# Patient Record
Sex: Male | Born: 1950 | Race: White | Hispanic: No | Marital: Married | Smoking: Former smoker
Health system: Southern US, Community
[De-identification: ages and names within clinical notes are randomized; demographics above are authoritative.]

## PROBLEM LIST (undated history)

## (undated) DIAGNOSIS — E119 Type 2 diabetes mellitus without complications: Secondary | ICD-10-CM

## (undated) DIAGNOSIS — T7840XA Allergy, unspecified, initial encounter: Secondary | ICD-10-CM

## (undated) DIAGNOSIS — E785 Hyperlipidemia, unspecified: Secondary | ICD-10-CM

## (undated) DIAGNOSIS — R739 Hyperglycemia, unspecified: Secondary | ICD-10-CM

## (undated) DIAGNOSIS — I10 Essential (primary) hypertension: Secondary | ICD-10-CM

## (undated) DIAGNOSIS — F319 Bipolar disorder, unspecified: Secondary | ICD-10-CM

## (undated) DIAGNOSIS — K635 Polyp of colon: Secondary | ICD-10-CM

## (undated) DIAGNOSIS — T781XXA Other adverse food reactions, not elsewhere classified, initial encounter: Secondary | ICD-10-CM

## (undated) HISTORY — DX: Type 2 diabetes mellitus without complications: E11.9

## (undated) HISTORY — DX: Allergy, unspecified, initial encounter: T78.40XA

## (undated) HISTORY — DX: Essential (primary) hypertension: I10

## (undated) HISTORY — DX: Bipolar disorder, unspecified: F31.9

## (undated) HISTORY — DX: Hyperlipidemia, unspecified: E78.5

## (undated) HISTORY — DX: Hyperglycemia, unspecified: R73.9

## (undated) HISTORY — DX: Other adverse food reactions, not elsewhere classified, initial encounter: T78.1XXA

## (undated) HISTORY — DX: Polyp of colon: K63.5

## (undated) HISTORY — PX: APPENDECTOMY: SHX54

## (undated) HISTORY — PX: EYE SURGERY: SHX253

## (undated) HISTORY — PX: SMALL INTESTINE SURGERY: SHX150

---

## 1994-06-13 DIAGNOSIS — F319 Bipolar disorder, unspecified: Secondary | ICD-10-CM

## 1994-06-13 HISTORY — DX: Bipolar disorder, unspecified: F31.9

## 2000-07-11 ENCOUNTER — Emergency Department (HOSPITAL_COMMUNITY): Admission: EM | Admit: 2000-07-11 | Discharge: 2000-07-11 | Payer: Self-pay | Admitting: Emergency Medicine

## 2002-03-18 ENCOUNTER — Encounter: Payer: Self-pay | Admitting: Emergency Medicine

## 2002-03-18 ENCOUNTER — Emergency Department (HOSPITAL_COMMUNITY): Admission: EM | Admit: 2002-03-18 | Discharge: 2002-03-18 | Payer: Self-pay | Admitting: Emergency Medicine

## 2008-06-13 LAB — HM COLONOSCOPY

## 2010-12-01 ENCOUNTER — Encounter: Payer: Self-pay | Admitting: Family Medicine

## 2010-12-01 DIAGNOSIS — F319 Bipolar disorder, unspecified: Secondary | ICD-10-CM | POA: Insufficient documentation

## 2010-12-01 DIAGNOSIS — Z889 Allergy status to unspecified drugs, medicaments and biological substances status: Secondary | ICD-10-CM

## 2010-12-01 DIAGNOSIS — I10 Essential (primary) hypertension: Secondary | ICD-10-CM

## 2010-12-01 DIAGNOSIS — K635 Polyp of colon: Secondary | ICD-10-CM

## 2010-12-21 ENCOUNTER — Other Ambulatory Visit: Payer: Self-pay | Admitting: Family Medicine

## 2010-12-24 ENCOUNTER — Other Ambulatory Visit: Payer: Self-pay

## 2010-12-31 ENCOUNTER — Ambulatory Visit
Admission: RE | Admit: 2010-12-31 | Discharge: 2010-12-31 | Disposition: A | Discharge status: Home / Self Care | Payer: BC Managed Care – PPO | Source: Ambulatory Visit | Attending: Family Medicine | Admitting: Family Medicine

## 2013-03-26 ENCOUNTER — Other Ambulatory Visit: Payer: BC Managed Care – PPO

## 2013-03-26 DIAGNOSIS — Z79899 Other long term (current) drug therapy: Secondary | ICD-10-CM

## 2013-03-26 DIAGNOSIS — E785 Hyperlipidemia, unspecified: Secondary | ICD-10-CM

## 2013-03-26 DIAGNOSIS — I10 Essential (primary) hypertension: Secondary | ICD-10-CM

## 2013-03-26 LAB — COMPLETE METABOLIC PANEL WITH GFR
AST: 42 U/L — ABNORMAL HIGH (ref 0–37)
Albumin: 4.1 g/dL (ref 3.5–5.2)
Alkaline Phosphatase: 69 U/L (ref 39–117)
GFR, Est African American: 82 mL/min
GFR, Est Non African American: 71 mL/min
Glucose, Bld: 110 mg/dL — ABNORMAL HIGH (ref 70–99)
Potassium: 4.2 mEq/L (ref 3.5–5.3)
Sodium: 137 mEq/L (ref 135–145)
Total Bilirubin: 0.8 mg/dL (ref 0.3–1.2)
Total Protein: 7 g/dL (ref 6.0–8.3)

## 2013-03-26 LAB — CBC WITH DIFFERENTIAL/PLATELET
Basophils Absolute: 0 10*3/uL (ref 0.0–0.1)
Basophils Relative: 1 % (ref 0–1)
Eosinophils Absolute: 0.3 10*3/uL (ref 0.0–0.7)
Eosinophils Relative: 4 % (ref 0–5)
HCT: 47.2 % (ref 39.0–52.0)
Hemoglobin: 16.4 g/dL (ref 13.0–17.0)
Lymphocytes Relative: 37 % (ref 12–46)
Lymphs Abs: 2.8 10*3/uL (ref 0.7–4.0)
MCH: 31.1 pg (ref 26.0–34.0)
MCHC: 34.7 g/dL (ref 30.0–36.0)
MCV: 89.4 fL (ref 78.0–100.0)
Monocytes Absolute: 0.5 10*3/uL (ref 0.1–1.0)
Monocytes Relative: 7 % (ref 3–12)
Neutro Abs: 3.8 10*3/uL (ref 1.7–7.7)
Neutrophils Relative %: 51 % (ref 43–77)
Platelets: 167 10*3/uL (ref 150–400)
RBC: 5.28 MIL/uL (ref 4.22–5.81)
RDW: 13.5 % (ref 11.5–15.5)
WBC: 7.5 10*3/uL (ref 4.0–10.5)

## 2013-03-26 LAB — LIPID PANEL
Cholesterol: 197 mg/dL (ref 0–200)
HDL: 48 mg/dL (ref >39)
Triglycerides: 197 mg/dL — ABNORMAL HIGH (ref <150)
VLDL: 39 mg/dL (ref 0–40)

## 2013-03-26 LAB — TSH: TSH: 2.672 u[IU]/mL (ref 0.350–4.500)

## 2013-03-26 LAB — PSA: PSA: 0.24 ng/mL (ref <=4.00)

## 2013-03-26 LAB — HEMOGLOBIN A1C: Mean Plasma Glucose: 137 mg/dL — ABNORMAL HIGH (ref <117)

## 2013-03-27 LAB — VITAMIN D 25 HYDROXY (VIT D DEFICIENCY, FRACTURES): Vit D, 25-Hydroxy: 33 ng/mL (ref 30–89)

## 2013-04-03 ENCOUNTER — Ambulatory Visit (INDEPENDENT_AMBULATORY_CARE_PROVIDER_SITE_OTHER): Payer: BC Managed Care – PPO | Admitting: Physician Assistant

## 2013-04-03 ENCOUNTER — Encounter: Payer: Self-pay | Admitting: Physician Assistant

## 2013-04-03 VITALS — BP 200/124 | HR 68 | Temp 98.7°F | Resp 18 | Ht 69.25 in | Wt 233.0 lb

## 2013-04-03 DIAGNOSIS — E785 Hyperlipidemia, unspecified: Secondary | ICD-10-CM | POA: Insufficient documentation

## 2013-04-03 DIAGNOSIS — D126 Benign neoplasm of colon, unspecified: Secondary | ICD-10-CM

## 2013-04-03 DIAGNOSIS — Z9109 Other allergy status, other than to drugs and biological substances: Secondary | ICD-10-CM

## 2013-04-03 DIAGNOSIS — Z889 Allergy status to unspecified drugs, medicaments and biological substances status: Secondary | ICD-10-CM

## 2013-04-03 DIAGNOSIS — R739 Hyperglycemia, unspecified: Secondary | ICD-10-CM | POA: Insufficient documentation

## 2013-04-03 DIAGNOSIS — F319 Bipolar disorder, unspecified: Secondary | ICD-10-CM

## 2013-04-03 DIAGNOSIS — K635 Polyp of colon: Secondary | ICD-10-CM

## 2013-04-03 DIAGNOSIS — I10 Essential (primary) hypertension: Secondary | ICD-10-CM

## 2013-04-03 DIAGNOSIS — Z23 Encounter for immunization: Secondary | ICD-10-CM

## 2013-04-03 MED ORDER — LISINOPRIL 20 MG PO TABS: 20.0000 mg | ORAL_TABLET | Freq: Every day | ORAL | Status: DC

## 2013-04-03 MED ORDER — HYDROCHLOROTHIAZIDE 12.5 MG PO CAPS: 12.5000 mg | ORAL_CAPSULE | Freq: Every day | ORAL | Status: DC

## 2013-04-03 MED ORDER — FLUTICASONE PROPIONATE 50 MCG/ACT NA SUSP: 2.0000 | Freq: Every day | NASAL | Status: DC

## 2013-04-03 NOTE — Progress Notes (Signed)
Patient ID: Daniel Blackwell MRN: [id number], DOB: 1951-03-28, 62 y.o. Date of Encounter: @DATE @  Chief Complaint:  Chief Complaint  Patient presents with  . problems with BP    has taken no meds sicne July    HPI: 62 y.o. year old white male  presents for followup office visit. m His last office visit here was actually June of 2013. He did come in fasting for lab work within the past week.  He reports that over the summer time he was not having to work for his job nearly as many hours.  Therefore he time to be a lot more active etc. When he checked his blood pressure he was getting low readings. He says that he is seeing the same pattern over the summers  every year.  He was having no lightheadedness on his blood pressure medicines but because he was getting lower blood pressure readings he stopped the medications. However now recently he has been getting higher readings so he came back in to restart his blood pressure medicines.  He reports that he is taking Allegra daily but still has quite a bit of congestion in his nasal area.  He has no other complaints.    Past Medical History  Diagnosis Date  . Allergy     mold/yeast  . Bipolar 1 disorder   . Colon polyps   . Hypertension      Home Meds: See attached medication section for current medication list. Any medications entered into computer today will not appear on this note's list. The medications listed below were entered prior to today. No current outpatient prescriptions on file prior to visit.   No current facility-administered medications on file prior to visit.    Allergies:  Allergies  Allergen Reactions  . Asa [Aspirin] Swelling  . Other Swelling    CASHEWS  . Penicillins   . Sulfa Antibiotics     History   Social History  . Marital Status: Married    Spouse Name: N/A    Number of Children: N/A  . Years of Education: N/A   Occupational History  . 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 Smoking status: Former Smoker    Quit date: 04/04/1983  . Smokeless tobacco: Never Used  . Alcohol Use: 0.5 oz/week    1 drink(s) per week  . Drug Use: No  . Sexual Activity: Not on file   Other Topics Concern  . 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   At certain times of the year he works long hours   but then at certain times of the year works a few hours.    No family history on file.   Review of Systems:  See HPI for pertinent ROS. All other ROS negative.    Physical Exam: Blood pressure 200/124, pulse 68, temperature 98.7 F (37.1 C), temperature source Oral, resp. rate 18, height 5' 9.25" (1.759 m), weight 233 lb (105.688 kg)., Body mass index is 34.16 kg/(m^2). General: WNWD WM. Appears in no acute distress. Neck: Supple. No thyromegaly. No lymphadenopathy. No carotid bruits. Lungs: Clear bilaterally to auscultation without wheezes, rales, or rhonchi. Breathing is unlabored. Heart: RRR with S1 S2. No murmurs, rubs, or gallops. Abdomen: Soft, non-tender, non-distended with normoactive bowel sounds. No  hepatomegaly. No rebound/guarding. No obvious abdominal masses. Musculoskeletal:  Strength and tone normal for age. Extremities/Skin: Warm and dry. No clubbing or cyanosis. No edema. No rashes or suspicious lesions. Neuro: Alert and oriented X 3. Moves all extremities spontaneously. Gait is normal. CNII-XII grossly in tact. Psych:  Responds to questions appropriately with a normal affect.   Results for orders placed in visit on 03/26/13  CBC WITH DIFFERENTIAL      Result Value Range   WBC 7.5  4.0 - 10.5 K/uL   RBC 5.28  4.22 - 5.81 MIL/uL   Hemoglobin 16.4  13.0 - 17.0 g/dL   HCT 16.1  09.6 - 04.5 %   MCV 89.4  78.0 - 100.0 fL   MCH 31.1  26.0 - 34.0 pg   MCHC 34.7  30.0 - 36.0 g/dL   RDW 40.9  81.1 - 91.4 %    Platelets 167  150 - 400 K/uL   Neutrophils Relative % 51  43 - 77 %   Neutro Abs 3.8  1.7 - 7.7 K/uL   Lymphocytes Relative 37  12 - 46 %   Lymphs Abs 2.8  0.7 - 4.0 K/uL   Monocytes Relative 7  3 - 12 %   Monocytes Absolute 0.5  0.1 - 1.0 K/uL   Eosinophils Relative 4  0 - 5 %   Eosinophils Absolute 0.3  0.0 - 0.7 K/uL   Basophils Relative 1  0 - 1 %   Basophils Absolute 0.0  0.0 - 0.1 K/uL   Smear Review Criteria for review not met    LIPID PANEL      Result Value Range   Cholesterol 197  0 - 200 mg/dL   Triglycerides 782 (*) <150 mg/dL   HDL 48  >95 mg/dL   Total CHOL/HDL Ratio 4.1     VLDL 39  0 - 40 mg/dL   LDL Cholesterol 621 (*) 0 - 99 mg/dL  HEMOGLOBIN H0Q      Result Value Range   Hemoglobin A1C 6.4 (*) <5.7 %   Mean Plasma Glucose 137 (*) <117 mg/dL  PSA      Result Value Range   PSA 0.24  <=4.00 ng/mL  TSH      Result Value Range   TSH 2.672  0.350 - 4.500 uIU/mL  VITAMIN D 25 HYDROXY      Result Value Range   Vit D, 25-Hydroxy 33  30 - 89 ng/mL  COMPLETE METABOLIC PANEL WITH GFR      Result Value Range   Sodium 137  135 - 145 mEq/L   Potassium 4.2  3.5 - 5.3 mEq/L   Chloride 99  96 - 112 mEq/L   CO2 27  19 - 32 mEq/L   Glucose, Bld 110 (*) 70 - 99 mg/dL   BUN 15  6 - 23 mg/dL   Creat 6.57  8.46 - 9.62 mg/dL   Total Bilirubin 0.8  0.3 - 1.2 mg/dL   Alkaline Phosphatase 69  39 - 117 U/L   AST 42 (*) 0 - 37 U/L   ALT 56 (*) 0 - 53 U/L   Total Protein 7.0  6.0 - 8.3 g/dL   Albumin 4.1  3.5 - 5.2 g/dL   Calcium 9.3  8.4 - 95.2 mg/dL   GFR, Est African American 82     GFR, Est Non African American 29       ASSESSMENT AND PLAN:  62 y.o. year old male with  1. Hypertension Restart prior blood pressure medications. Turn to clinic in 2 weeks to recheck blood pressure and BMET on these medications. - lisinopril (PRINIVIL,ZESTRIL) 20 MG tablet; Take 1 tablet (20 mg total) by mouth daily.  Dispense: 30 tablet; Refill: 0 - hydrochlorothiazide (MICROZIDE) 12.5  MG capsule; Take 1 capsule (12.5 mg total) by mouth daily.  Dispense: 30 capsule; Refill: 0  2. Multiple allergies Continue Allegra. Add Flonase as needed. - fluticasone (FLONASE) 50 MCG/ACT nasal spray; Place 2 sprays into the nose daily.  Dispense: 16 g; Refill: 6  3. Hyperglycemia/ Borderline Diabetes He reports that he does still have his carbohydrate information. Continue to follow a low carbohydrate diet and continue cardiovascular exercise.  4. Mild Hyperlipidemia: Discussed with patient that his cholesterol is not severely elevated by given his diabetes and his family history of CAD but I recommend a low dose statin. However he defers this at this time. He states that he will increase the dose of his fistula for now.  3. Colon polyps My last note in June 2013 documented that his last screening colonoscopy was performed 03/13/2006. It had revealed a polyp. We discussed he was due for followup. He discussed that his prior colonoscopy was performed by Dr. that is no longer covered by his insurance plan. His wife was scheduled for colonoscopy with a different GI doctor and he was going to decide whether to schedule followup colonoscopy but that same GI doctor or not. Today he states that he saw in his checkbook records that indicated he had a colonoscopy since the 1 in 2007. However he knows no further details about it than this. I told him to try to check his records further we can follow this up.  4. prostate cancer screening: PSA was normal at lap above  5. Immunizations: A. tetanus was updated here June 2013. B. influenza vaccine: He is agreeable to get this today. We'll give now. C. Zostavax: He states he did check with his insurance but they do not cover this at all. Therefore he defers this. D. Pneumovax: We'll do this at age 83.     [address] [place], [place], [facility] 04/03/2013 9:23 AM

## 2013-04-17 ENCOUNTER — Encounter: Payer: BC Managed Care – PPO | Admitting: Physician Assistant

## 2013-04-17 ENCOUNTER — Encounter: Payer: Self-pay | Admitting: Physician Assistant

## 2013-04-17 NOTE — Progress Notes (Signed)
Patient left office without being seen.

## 2013-04-18 NOTE — Progress Notes (Signed)
This encounter was created in error - please disregard.

## 2013-04-21 ENCOUNTER — Encounter: Payer: Self-pay | Admitting: Physician Assistant

## 2014-05-19 ENCOUNTER — Encounter: Payer: Self-pay | Admitting: Physician Assistant

## 2014-05-19 ENCOUNTER — Ambulatory Visit (INDEPENDENT_AMBULATORY_CARE_PROVIDER_SITE_OTHER): Payer: Medicare PPO | Admitting: Physician Assistant

## 2014-05-19 VITALS — BP 160/106 | HR 64 | Temp 98.9°F | Resp 20 | Wt 228.0 lb

## 2014-05-19 DIAGNOSIS — E785 Hyperlipidemia, unspecified: Secondary | ICD-10-CM

## 2014-05-19 DIAGNOSIS — F319 Bipolar disorder, unspecified: Secondary | ICD-10-CM

## 2014-05-19 DIAGNOSIS — K635 Polyp of colon: Secondary | ICD-10-CM

## 2014-05-19 DIAGNOSIS — Z125 Encounter for screening for malignant neoplasm of prostate: Secondary | ICD-10-CM

## 2014-05-19 DIAGNOSIS — R739 Hyperglycemia, unspecified: Secondary | ICD-10-CM

## 2014-05-19 DIAGNOSIS — I10 Essential (primary) hypertension: Secondary | ICD-10-CM

## 2014-05-19 DIAGNOSIS — J988 Other specified respiratory disorders: Secondary | ICD-10-CM

## 2014-05-19 DIAGNOSIS — B9689 Other specified bacterial agents as the cause of diseases classified elsewhere: Secondary | ICD-10-CM

## 2014-05-19 LAB — LIPID PANEL
CHOLESTEROL: 211 mg/dL — AB (ref 0–200)
HDL: 48 mg/dL (ref >39)
LDL Cholesterol: 118 mg/dL — ABNORMAL HIGH (ref 0–99)
TRIGLYCERIDES: 223 mg/dL — AB (ref <150)
Total CHOL/HDL Ratio: 4.4 Ratio
VLDL: 45 mg/dL — AB (ref 0–40)

## 2014-05-19 LAB — COMPLETE METABOLIC PANEL WITH GFR
ALT: 36 U/L (ref 0–53)
AST: 26 U/L (ref 0–37)
Albumin: 4.4 g/dL (ref 3.5–5.2)
Alkaline Phosphatase: 94 U/L (ref 39–117)
BUN: 14 mg/dL (ref 6–23)
CO2: 26 mEq/L (ref 19–32)
Calcium: 9.6 mg/dL (ref 8.4–10.5)
Chloride: 99 mEq/L (ref 96–112)
Creat: 1.06 mg/dL (ref 0.50–1.35)
GFR, Est African American: 85 mL/min
GFR, Est Non African American: 73 mL/min
Glucose, Bld: 93 mg/dL (ref 70–99)
POTASSIUM: 4 meq/L (ref 3.5–5.3)
SODIUM: 139 meq/L (ref 135–145)
Total Bilirubin: 0.6 mg/dL (ref 0.2–1.2)
Total Protein: 7.6 g/dL (ref 6.0–8.3)

## 2014-05-19 LAB — HEMOGLOBIN A1C
Hgb A1c MFr Bld: 6.4 % — ABNORMAL HIGH (ref <5.7)
MEAN PLASMA GLUCOSE: 137 mg/dL — AB (ref <117)

## 2014-05-19 MED ORDER — AZITHROMYCIN 250 MG PO TABS: ORAL_TABLET | ORAL | Status: DC

## 2014-05-19 MED ORDER — HYDROCHLOROTHIAZIDE 12.5 MG PO CAPS: 12.5000 mg | ORAL_CAPSULE | Freq: Every day | ORAL | Status: DC

## 2014-05-19 MED ORDER — LISINOPRIL 20 MG PO TABS: 20.0000 mg | ORAL_TABLET | Freq: Every day | ORAL | Status: DC

## 2014-05-19 NOTE — Progress Notes (Signed)
Patient ID: Daniel Blackwell MRN: [id number], DOB: [birth year], 63 y.o. Date of Encounter: @DATE @  Chief Complaint:  Chief Complaint  Patient presents with  . sick x 1 month    recurrent head cold, HA's, thick secretions, OTC not helping, has run out of BP meds for months now    HPI: 63 y.o. year old white male  presents with above symptoms.  Says it all seems to be head and nasal congestion. He has a lot of mucus coming from his nose and a lot of drainage down his throat early in the morning. Does not seem to have any chest congestion no deep cough. No fevers or chills.  Is out of his  medications. He is fasting.  Says that he just got a letter that he is due for his colonoscopy and says that he will schedule this.   Past Medical History  Diagnosis Date  . Allergy     mold/yeast  . Bipolar 1 disorder   . Colon polyps   . Hypertension   . Hyperlipidemia   . Hyperglycemia      Home Meds: Outpatient Prescriptions Prior to Visit  Medication Sig Dispense Refill  . fluticasone (FLONASE) 50 MCG/ACT nasal spray Place 2 sprays into the nose daily. (Patient not taking: Reported on 05/19/2014) 16 g 6  . hydrochlorothiazide (MICROZIDE) 12.5 MG capsule Take 1 capsule (12.5 mg total) by mouth daily. (Patient not taking: Reported on 05/19/2014) 30 capsule 0  . lisinopril (PRINIVIL,ZESTRIL) 20 MG tablet Take 1 tablet (20 mg total) by mouth daily. (Patient not taking: Reported on 05/19/2014) 30 tablet 0   No facility-administered medications prior to visit.    Allergies:  Allergies  Allergen Reactions  . Asa [Aspirin] Swelling  . Other Swelling    CASHEWS  . Penicillins   . Sulfa Antibiotics     History   Social History  . Marital Status: Married    Spouse Name: N/A    Number of Children: N/A  . Years of Education: N/A   Occupational History  . State, [employer]   Social History Main Topics  . Smoking status: Former Smoker    Quit date: 04/04/1983   . Smokeless tobacco: Never Used  . Alcohol Use: 0.5 oz/week    1 drink(s) per week  . Drug Use: No  . Sexual Activity: Not on file   Other Topics Concern  . 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   At certain times of the year he works long hours   but then at certain times of the year works a few hours.    History reviewed. No pertinent family history.   Review of Systems:  See HPI for pertinent ROS. All other ROS negative.    Physical Exam: Blood pressure 160/106, pulse 64, temperature 98.9 F (37.2 C), temperature source Oral, resp. rate 20, weight 228 lb (103.42 kg)., Body mass index is 33.43 kg/(m^2). General: WM. Appears in no acute distress. Head: Normocephalic, atraumatic, eyes without discharge, sclera non-icteric, nares are without discharge. Bilateral auditory canals clear, TM's are without perforation, pearly grey and translucent with reflective cone of light bilaterally. Oral cavity moist, posterior pharynx without exudate, erythema, peritonsillar abscess. No tenderness with percussion of frontal and maxillary sinuses bilaterally.  Neck: Supple. No thyromegaly. No  lymphadenopathy. No carotid bruits. Lungs: Clear bilaterally to auscultation without wheezes, rales, or rhonchi. Breathing is unlabored. Heart: RRR with S1 S2. No murmurs, rubs, or gallops. Abdomen: Soft, non-tender, non-distended with normoactive bowel sounds. No hepatomegaly. No rebound/guarding. No obvious abdominal masses. Musculoskeletal:  Strength and tone normal for age. Extremities/Skin: Warm and dry.  Neuro: Alert and oriented X 3. Moves all extremities spontaneously. Gait is normal. CNII-XII grossly in tact. Psych:  Responds to questions appropriately with a normal affect.     ASSESSMENT AND PLAN:  63 y.o. year old male with  1. Bacterial respiratory infection -  azithromycin (ZITHROMAX) 250 MG tablet; Day 1: Take 2 daily. Days 2-5: Take 1 daily.  Dispense: 6 tablet; Refill: 0  2. Bipolar 1 disorder  3. Essential hypertension - lisinopril (PRINIVIL,ZESTRIL) 20 MG tablet; Take 1 tablet (20 mg total) by mouth daily.  Dispense: 30 tablet; Refill: 0 - hydrochlorothiazide (MICROZIDE) 12.5 MG capsule; Take 1 capsule (12.5 mg total) by mouth daily.  Dispense: 30 capsule; Refill: 0 - COMPLETE METABOLIC PANEL WITH GFR  4. Colon polyps  5. Hyperlipidemia - COMPLETE METABOLIC PANEL WITH GFR - Lipid panel  6. Hyperglycemia - COMPLETE METABOLIC PANEL WITH GFR - Hemoglobin A1c  7. Prostate cancer screening - PSA, Medicare  All labs are due. Will go ahead and check labs now since he is fasting so that we can make sure labs are normal prior to restarting his medications. Go ahead and restart his blood pressure medicines. Have him schedule follow-up office visit here in 2 weeks to recheck blood pressure on medications and follow-up regarding the other lab results. Will also follow-up preventive care at that visit and will review my last office visit note and update that as well.  Regarding his bacterial infection his to take the azithromycin as directed. Follow-up if symptoms do not resolve within 1 week after completion of antibiotic.   Signed, [address] [place], [place], [facility] 05/19/2014 11:45 AM

## 2014-05-20 LAB — PSA, MEDICARE: PSA: 0.25 ng/mL (ref <=4.00)

## 2014-06-11 ENCOUNTER — Encounter: Payer: Self-pay | Admitting: Physician Assistant

## 2014-06-11 ENCOUNTER — Ambulatory Visit (INDEPENDENT_AMBULATORY_CARE_PROVIDER_SITE_OTHER): Payer: Medicare PPO | Admitting: Physician Assistant

## 2014-06-11 VITALS — BP 116/86 | HR 96 | Temp 98.4°F | Resp 18 | Wt 226.0 lb

## 2014-06-11 DIAGNOSIS — E785 Hyperlipidemia, unspecified: Secondary | ICD-10-CM

## 2014-06-11 DIAGNOSIS — K635 Polyp of colon: Secondary | ICD-10-CM

## 2014-06-11 DIAGNOSIS — I10 Essential (primary) hypertension: Secondary | ICD-10-CM

## 2014-06-11 DIAGNOSIS — Z23 Encounter for immunization: Secondary | ICD-10-CM

## 2014-06-11 DIAGNOSIS — R739 Hyperglycemia, unspecified: Secondary | ICD-10-CM

## 2014-06-11 MED ORDER — LISINOPRIL 20 MG PO TABS: 20.0000 mg | ORAL_TABLET | Freq: Every day | ORAL | Status: DC

## 2014-06-11 MED ORDER — HYDROCHLOROTHIAZIDE 12.5 MG PO CAPS
12.5000 mg | ORAL_CAPSULE | Freq: Every day | ORAL | Status: DC
Start: 2014-06-11 — End: 2014-06-23

## 2014-06-11 NOTE — Progress Notes (Signed)
Patient ID: Daniel Blackwell MRN: [id number], DOB: 10-Apr-1951, 63 y.o. Date of Encounter: @DATE @  Chief Complaint:  Chief Complaint  Patient presents with  . follow up BP    has Colonoscopy 06/30/14 FYI    HPI: 63 y.o. year old white male  presents for followup office visit.  His last routine office visit was October 2014.  He had a recent visit with me earlier in December with a respiratory infection. At that visit I noted that he was overdue for follow-up. At that visit he was out of his blood pressure medications and his blood pressure was elevated. He happened to be fasting at that visit so we obtained routine fasting labs and I went ahead and restarted blood pressure medication and had him schedule follow-up visit.  He is taking his blood pressure medications as directed with no adverse effects. He has no complaints today.  Says that he has been trying to make some diet changes.  Says he was eating [employer] sausage biscuit 1 or 2 mornings per week but stopped this at the end of November. Also says he had gotten in the habit of eating ice cream at night and he has stopped this as well. He has also been trying to change to Lean[employer] meats. Says that he is not exercising currently because of the bad weather but when the weather improves he will be more active.     Past Medical History  Diagnosis Date  . Allergy     mold/yeast  . Bipolar 1 disorder   . Colon polyps   . Hypertension   . Hyperlipidemia   . Hyperglycemia      Home Meds: Outpatient Prescriptions Prior to Visit  Medication Sig Dispense Refill  . cetirizine (ZYRTEC) 10 MG tablet Take 10 mg by mouth daily.    . Multiple Vitamin (MULTIVITAMIN) tablet Take 1 tablet by mouth daily.    . Omega-3 Fatty Acids (FISH OIL) 1000 MG CPDR Take 2 capsules by mouth daily.    . vitamin B-12 (CYANOCOBALAMIN) 1000 MCG tablet Take 1,000 mcg by mouth daily.    . hydrochlorothiazide (MICROZIDE) 12.5 MG capsule Take 1 capsule  (12.5 mg total) by mouth daily. 30 capsule 0  . lisinopril (PRINIVIL,ZESTRIL) 20 MG tablet Take 1 tablet (20 mg total) by mouth daily. 30 tablet 0  . fluticasone (FLONASE) 50 MCG/ACT nasal spray Place 2 sprays into the nose daily. (Patient not taking: Reported on 05/19/2014) 16 g 6  . azithromycin (ZITHROMAX) 250 MG tablet Day 1: Take 2 daily. Days 2-5: Take 1 daily. 6 tablet 0   No facility-administered medications prior to visit.     Allergies:  Allergies  Allergen Reactions  . Asa [Aspirin] Swelling  . Other Swelling    CASHEWS  . Penicillins   . Sulfa Antibiotics     History   Social History  . Marital Status: Married    Spouse Name: N/A    Number of Children: N/A  . Years of Education: N/A   Occupational History  . State, [employer]   Social History Main Topics  . Smoking status: Former Smoker    Quit date: 04/04/1983  . Smokeless tobacco: Never Used  . Alcohol Use: 0.5 oz/week    1 drink(s) per week  . Drug Use: No  . Sexual Activity: Not on file   Other Topics Concern  . Not on file   Social History Narrative   Works with the  school system.    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   At certain times of the year he works long hours   but then at certain times of the year works a few hours.    History reviewed. No pertinent family history.   Review of Systems:  See HPI for pertinent ROS. All other ROS negative.    Physical Exam: Blood pressure 116/86, pulse 96, temperature 98.4 F (36.9 C), temperature source Oral, resp. rate 18, weight 226 lb (102.513 kg)., Body mass index is 33.13 kg/(m^2). General: WNWD WM. Appears in no acute distress. Neck: Supple. No thyromegaly. No lymphadenopathy. No carotid bruits. Lungs: Clear bilaterally to auscultation without wheezes, rales, or rhonchi. Breathing is unlabored. Heart: RRR with S1 S2. No murmurs, rubs, or gallops. Abdomen: Soft,  non-tender, non-distended with normoactive bowel sounds. No hepatomegaly. No rebound/guarding. No obvious abdominal masses. Musculoskeletal:  Strength and tone normal for age. Extremities/Skin: Warm and dry.  No edema.  Neuro: Alert and oriented X 3. Moves all extremities spontaneously. Gait is normal. CNII-XII grossly in tact. Psych:  Responds to questions appropriately with a normal affect.   Results for orders placed or performed in visit on 05/19/14  COMPLETE METABOLIC PANEL WITH GFR  Result Value Ref Range   Sodium 139 135 - 145 mEq/L   Potassium 4.0 3.5 - 5.3 mEq/L   Chloride 99 96 - 112 mEq/L   CO2 26 19 - 32 mEq/L   Glucose, Bld 93 70 - 99 mg/dL   BUN 14 6 - 23 mg/dL   Creat 1.06 0.50 - 1.35 mg/dL   Total Bilirubin 0.6 0.2 - 1.2 mg/dL   Alkaline Phosphatase 94 39 - 117 U/L   AST 26 0 - 37 U/L   ALT 36 0 - 53 U/L   Total Protein 7.6 6.0 - 8.3 g/dL   Albumin 4.4 3.5 - 5.2 g/dL   Calcium 9.6 8.4 - 10.5 mg/dL   GFR, Est African American 85 mL/min   GFR, Est Non African American 73 mL/min  Lipid panel  Result Value Ref Range   Cholesterol 211 (H) 0 - 200 mg/dL   Triglycerides 223 (H) <150 mg/dL   HDL 48 >39 mg/dL   Total CHOL/HDL Ratio 4.4 Ratio   VLDL 45 (H) 0 - 40 mg/dL   LDL Cholesterol 118 (H) 0 - 99 mg/dL  PSA, Medicare  Result Value Ref Range   PSA 0.25 <=4.00 ng/mL  Hemoglobin A1c  Result Value Ref Range   Hgb A1c MFr Bld 6.4 (H) <5.7 %   Mean Plasma Glucose 137 (H) <117 mg/dL     ASSESSMENT AND PLAN:  63 y.o. year old male with   1. Essential hypertension  Blood pressure at goal. Recheck BMET on medications. - BASIC METABOLIC PANEL WITH GFR - lisinopril (PRINIVIL,ZESTRIL) 20 MG tablet; Take 1 tablet (20 mg total) by mouth daily.  Dispense: 90 tablet; Refill: 1 - hydrochlorothiazide (MICROZIDE) 12.5 MG capsule; Take 1 capsule (12.5 mg total) by mouth daily.  Dispense: 90 capsule; Refill: 1  2. Hyperlipidemia . Mild Hyperlipidemia: Discussed with  patient that his cholesterol is not severely elevated but given his diabetes and his family history of CAD , I recommend a low dose statin. However he defers this--Deferred at [place] and again today.  He is making diet changes so hopefully this will further improve.   3. Hyperglycemia A1C has remained stable for over one year. We'll  continue to manage with diet and exercise and will continue to monitor. Have provided him with carbohydrate information in the past and he continues to have this information.  4. Colon polyps My note in June 2013 documented that his last screening colonoscopy was performed 03/13/2006. It had revealed a polyp. We discussed he was due for followup. He discussed that his prior colonoscopy was performed by Dr. that is no longer covered by his insurance plan. His wife was scheduled for colonoscopy with a different GI doctor and he was going to decide whether to schedule followup colonoscopy but that same GI doctor or not. Today he states that he saw in his checkbook records that indicated he had a colonoscopy since the one in 2007. However he knows no further details about it than this. I told him to try to check his records further we can follow this up.  Today -- 06/11/14 -- he reports that he has a follow-up colonoscopy scheduled for 06/30/2014.  4. prostate cancer screening: PSA was normal at lab above  5. Immunizations: A. tetanus was updated here June 2013. B. influenza vaccine: He reports that he received this Mid- October 2015 C. Zostavax: He states he did check with his insurance but they do not cover this at all. Therefore he defers this. D. Pneumonia Vaccine: Given that he is now age 73, needs to have this. Discussed today and he is agreeable.      Today--06/11/2014---- give Prevnar 13.     6-12 months later will give Pneumovax 23.  Needs routine office visit 6 months or sooner if needed.     Signed, [address] [place], [place], [facility] 06/11/2014 2:52  PM

## 2014-06-12 ENCOUNTER — Ambulatory Visit: Payer: Medicare Other | Admitting: Physician Assistant

## 2014-06-12 LAB — BASIC METABOLIC PANEL WITH GFR
BUN: 17 mg/dL (ref 6–23)
CO2: 26 mEq/L (ref 19–32)
Calcium: 9.7 mg/dL (ref 8.4–10.5)
Chloride: 99 mEq/L (ref 96–112)
Creat: 1.01 mg/dL (ref 0.50–1.35)
GFR, EST NON AFRICAN AMERICAN: 78 mL/min
Glucose, Bld: 92 mg/dL (ref 70–99)
POTASSIUM: 4 meq/L (ref 3.5–5.3)
Sodium: 138 mEq/L (ref 135–145)

## 2014-06-17 ENCOUNTER — Encounter: Payer: Self-pay | Admitting: Family Medicine

## 2014-06-21 ENCOUNTER — Encounter: Payer: Self-pay | Admitting: Physician Assistant

## 2014-06-21 DIAGNOSIS — I10 Essential (primary) hypertension: Secondary | ICD-10-CM

## 2014-06-23 MED ORDER — HYDROCHLOROTHIAZIDE 12.5 MG PO CAPS: 12.5000 mg | ORAL_CAPSULE | Freq: Every day | ORAL | Status: DC

## 2014-06-23 MED ORDER — LISINOPRIL 20 MG PO TABS: 20.0000 mg | ORAL_TABLET | Freq: Every day | ORAL | Status: DC

## 2014-12-02 ENCOUNTER — Other Ambulatory Visit: Payer: Medicare PPO

## 2014-12-02 DIAGNOSIS — E785 Hyperlipidemia, unspecified: Secondary | ICD-10-CM

## 2014-12-02 DIAGNOSIS — F319 Bipolar disorder, unspecified: Secondary | ICD-10-CM

## 2014-12-02 DIAGNOSIS — Z79899 Other long term (current) drug therapy: Secondary | ICD-10-CM

## 2014-12-02 DIAGNOSIS — I10 Essential (primary) hypertension: Secondary | ICD-10-CM

## 2014-12-02 DIAGNOSIS — R739 Hyperglycemia, unspecified: Secondary | ICD-10-CM

## 2014-12-02 LAB — COMPLETE METABOLIC PANEL WITH GFR
ALT: 34 U/L (ref 0–53)
AST: 28 U/L (ref 0–37)
Albumin: 4.2 g/dL (ref 3.5–5.2)
Alkaline Phosphatase: 66 U/L (ref 39–117)
BILIRUBIN TOTAL: 0.8 mg/dL (ref 0.2–1.2)
BUN: 16 mg/dL (ref 6–23)
CO2: 28 mEq/L (ref 19–32)
Calcium: 9.7 mg/dL (ref 8.4–10.5)
Chloride: 101 mEq/L (ref 96–112)
Creat: 1.08 mg/dL (ref 0.50–1.35)
GFR, Est African American: 83 mL/min
GFR, Est Non African American: 72 mL/min
Glucose, Bld: 109 mg/dL — ABNORMAL HIGH (ref 70–99)
POTASSIUM: 4.5 meq/L (ref 3.5–5.3)
SODIUM: 142 meq/L (ref 135–145)
Total Protein: 7.4 g/dL (ref 6.0–8.3)

## 2014-12-02 LAB — CBC WITH DIFFERENTIAL/PLATELET
BASOS PCT: 1 % (ref 0–1)
Basophils Absolute: 0.1 10*3/uL (ref 0.0–0.1)
Eosinophils Absolute: 0.3 10*3/uL (ref 0.0–0.7)
Eosinophils Relative: 4 % (ref 0–5)
HEMATOCRIT: 49 % (ref 39.0–52.0)
Hemoglobin: 16.7 g/dL (ref 13.0–17.0)
Lymphocytes Relative: 37 % (ref 12–46)
Lymphs Abs: 3.2 10*3/uL (ref 0.7–4.0)
MCH: 30.3 pg (ref 26.0–34.0)
MCHC: 34.1 g/dL (ref 30.0–36.0)
MCV: 88.8 fL (ref 78.0–100.0)
MONO ABS: 0.6 10*3/uL (ref 0.1–1.0)
MPV: 10.4 fL (ref 8.6–12.4)
Monocytes Relative: 7 % (ref 3–12)
Neutro Abs: 4.4 10*3/uL (ref 1.7–7.7)
Neutrophils Relative %: 51 % (ref 43–77)
Platelets: 160 10*3/uL (ref 150–400)
RBC: 5.52 MIL/uL (ref 4.22–5.81)
RDW: 14.3 % (ref 11.5–15.5)
WBC: 8.7 10*3/uL (ref 4.0–10.5)

## 2014-12-02 LAB — LIPID PANEL
CHOL/HDL RATIO: 4.7 ratio
Cholesterol: 218 mg/dL — ABNORMAL HIGH (ref 0–200)
HDL: 46 mg/dL (ref >=40)
LDL Cholesterol: 105 mg/dL — ABNORMAL HIGH (ref 0–99)
Triglycerides: 337 mg/dL — ABNORMAL HIGH (ref <150)
VLDL: 67 mg/dL — ABNORMAL HIGH (ref 0–40)

## 2014-12-02 LAB — TSH: TSH: 2.129 u[IU]/mL (ref 0.350–4.500)

## 2014-12-03 LAB — HEMOGLOBIN A1C
Hgb A1c MFr Bld: 6.7 % — ABNORMAL HIGH (ref <5.7)
Mean Plasma Glucose: 146 mg/dL — ABNORMAL HIGH (ref <117)

## 2014-12-04 ENCOUNTER — Encounter: Payer: Self-pay | Admitting: Family Medicine

## 2014-12-04 ENCOUNTER — Other Ambulatory Visit: Payer: Self-pay | Admitting: Family Medicine

## 2014-12-04 ENCOUNTER — Ambulatory Visit (INDEPENDENT_AMBULATORY_CARE_PROVIDER_SITE_OTHER): Payer: Medicare PPO | Admitting: Physician Assistant

## 2014-12-04 ENCOUNTER — Encounter: Payer: Self-pay | Admitting: Physician Assistant

## 2014-12-04 VITALS — BP 126/70 | HR 78 | Temp 98.3°F | Resp 18 | Wt 221.0 lb

## 2014-12-04 DIAGNOSIS — Z889 Allergy status to unspecified drugs, medicaments and biological substances status: Secondary | ICD-10-CM

## 2014-12-04 DIAGNOSIS — I10 Essential (primary) hypertension: Secondary | ICD-10-CM

## 2014-12-04 DIAGNOSIS — F319 Bipolar disorder, unspecified: Secondary | ICD-10-CM

## 2014-12-04 DIAGNOSIS — K635 Polyp of colon: Secondary | ICD-10-CM

## 2014-12-04 DIAGNOSIS — Z9109 Other allergy status, other than to drugs and biological substances: Secondary | ICD-10-CM

## 2014-12-04 DIAGNOSIS — E785 Hyperlipidemia, unspecified: Secondary | ICD-10-CM

## 2014-12-04 DIAGNOSIS — R739 Hyperglycemia, unspecified: Secondary | ICD-10-CM | POA: Diagnosis not present

## 2014-12-04 MED ORDER — LISINOPRIL 20 MG PO TABS: 20.0000 mg | ORAL_TABLET | Freq: Every day | ORAL | Status: DC

## 2014-12-04 MED ORDER — HYDROCHLOROTHIAZIDE 12.5 MG PO CAPS: 12.5000 mg | ORAL_CAPSULE | Freq: Every day | ORAL | Status: DC

## 2014-12-04 MED ORDER — FLUTICASONE PROPIONATE 50 MCG/ACT NA SUSP: 2.0000 | Freq: Every day | NASAL | Status: DC

## 2014-12-04 NOTE — Telephone Encounter (Signed)
meds refilled per provider request

## 2014-12-04 NOTE — Progress Notes (Signed)
Patient ID: Daniel Blackwell MRN: [id number], DOB: [birth year], 64 y.o. Date of Encounter: @DATE @  Chief Complaint:  Chief Complaint  Patient presents with  . Follow-up    6 mos, discuss lab results    HPI: 64 y.o. year old white male  presents for followup office visit.  He is taking his blood pressure medications as directed with no adverse effects. He has no complaints today.  At prior OV, he said that he had been trying to make some diet changes.  Said he was eating [employer] sausage biscuit 1 or 2 mornings per week but stopped this at the end of November 2015. Also said he had gotten in the habit of eating ice cream at night and he had stopped this as well. He has also been trying to change to leaner meats.  Says that he is stayed off of the biscuits. Has stayed away from the habit of ice cream every night but does occasionally have some ice cream.     Past Medical History  Diagnosis Date  . Allergy     mold/yeast  . Bipolar 1 disorder   . Colon polyps   . Hypertension   . Hyperlipidemia   . Hyperglycemia      Home Meds: Outpatient Prescriptions Prior to Visit  Medication Sig Dispense Refill  . cetirizine (ZYRTEC) 10 MG tablet Take 10 mg by mouth daily.    . fluticasone (FLONASE) 50 MCG/ACT nasal spray Place 2 sprays into the nose daily. 16 g 6  . hydrochlorothiazide (MICROZIDE) 12.5 MG capsule Take 1 capsule (12.5 mg total) by mouth daily. 90 capsule 1  . lisinopril (PRINIVIL,ZESTRIL) 20 MG tablet Take 1 tablet (20 mg total) by mouth daily. 90 tablet 1  . Multiple Vitamin (MULTIVITAMIN) tablet Take 1 tablet by mouth daily.    . Omega-3 Fatty Acids (FISH OIL) 1000 MG CPDR Take 2 capsules by mouth daily.    . vitamin B-12 (CYANOCOBALAMIN) 1000 MCG tablet Take 1,000 mcg by mouth daily.     No facility-administered medications prior to visit.     Allergies:  Allergies  Allergen Reactions  . Asa [Aspirin] Swelling  . Other Swelling    CASHEWS  . Penicillins    . Sulfa Antibiotics     History   Social History  . Marital Status: Married    Spouse Name: N/A  . Number of Children: N/A  . Years of Education: N/A   Occupational History  . State, [place]   Social History Main Topics  . Smoking status: Former Smoker    Quit date: 04/04/1983  . Smokeless tobacco: Never Used  . Alcohol Use: 0.5 oz/week    1 drink(s) per week  . Drug Use: No  . Sexual Activity: Not on file   Other Topics Concern  . 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   At certain times of the year he works long hours   but then at certain times of the year works a few hours.    History reviewed. No pertinent family history.   Review of Systems:  See HPI for pertinent ROS. All other ROS negative.    Physical Exam: Blood pressure 126/70, pulse 78, temperature 98.3 F (36.8 C), temperature source Oral, resp. rate 18, weight 221 lb (100.245 kg)., Body  mass index is 32.4 kg/(m^2). General: WNWD WM. Appears in no acute distress. Neck: Supple. No thyromegaly. No lymphadenopathy. No carotid bruits. Lungs: Clear bilaterally to auscultation without wheezes, rales, or rhonchi. Breathing is unlabored. Heart: RRR with S1 S2. No murmurs, rubs, or gallops. Abdomen: Soft, non-tender, non-distended with normoactive bowel sounds. No hepatomegaly. No rebound/guarding. No obvious abdominal masses. Musculoskeletal:  Strength and tone normal for age. Extremities/Skin: Warm and dry.  No edema.  Neuro: Alert and oriented X 3. Moves all extremities spontaneously. Gait is normal. CNII-XII grossly in tact. Psych:  Responds to questions appropriately with a normal affect.   Results for orders placed or performed in visit on 12/02/14  COMPLETE METABOLIC PANEL WITH GFR  Result Value Ref Range   Sodium 142 135 - 145 mEq/L   Potassium 4.5 3.5  - 5.3 mEq/L   Chloride 101 96 - 112 mEq/L   CO2 28 19 - 32 mEq/L   Glucose, Bld 109 (H) 70 - 99 mg/dL   BUN 16 6 - 23 mg/dL   Creat 1.08 0.50 - 1.35 mg/dL   Total Bilirubin 0.8 0.2 - 1.2 mg/dL   Alkaline Phosphatase 66 39 - 117 U/L   AST 28 0 - 37 U/L   ALT 34 0 - 53 U/L   Total Protein 7.4 6.0 - 8.3 g/dL   Albumin 4.2 3.5 - 5.2 g/dL   Calcium 9.7 8.4 - 10.5 mg/dL   GFR, Est African American 83 mL/min   GFR, Est Non African American 72 mL/min  TSH  Result Value Ref Range   TSH 2.129 0.350 - 4.500 uIU/mL  Lipid panel  Result Value Ref Range   Cholesterol 218 (H) 0 - 200 mg/dL   Triglycerides 337 (H) <150 mg/dL   HDL 46 >=40 mg/dL   Total CHOL/HDL Ratio 4.7 Ratio   VLDL 67 (H) 0 - 40 mg/dL   LDL Cholesterol 105 (H) 0 - 99 mg/dL  CBC with Differential/Platelet  Result Value Ref Range   WBC 8.7 4.0 - 10.5 K/uL   RBC 5.52 4.22 - 5.81 MIL/uL   Hemoglobin 16.7 13.0 - 17.0 g/dL   HCT 49.0 39.0 - 52.0 %   MCV 88.8 78.0 - 100.0 fL   MCH 30.3 26.0 - 34.0 pg   MCHC 34.1 30.0 - 36.0 g/dL   RDW 14.3 11.5 - 15.5 %   Platelets 160 150 - 400 K/uL   MPV 10.4 8.6 - 12.4 fL   Neutrophils Relative % 51 43 - 77 %   Neutro Abs 4.4 1.7 - 7.7 K/uL   Lymphocytes Relative 37 12 - 46 %   Lymphs Abs 3.2 0.7 - 4.0 K/uL   Monocytes Relative 7 3 - 12 %   Monocytes Absolute 0.6 0.1 - 1.0 K/uL   Eosinophils Relative 4 0 - 5 %   Eosinophils Absolute 0.3 0.0 - 0.7 K/uL   Basophils Relative 1 0 - 1 %   Basophils Absolute 0.1 0.0 - 0.1 K/uL   Smear Review Criteria for review not met   Hemoglobin A1c  Result Value Ref Range   Hgb A1c MFr Bld 6.7 (H) <5.7 %   Mean Plasma Glucose 146 (H) <117 mg/dL     ASSESSMENT AND PLAN:  64 y.o. year old male with   1. Essential hypertension  Blood pressure at goal. Recheck BMET on medications. - BASIC METABOLIC PANEL WITH GFR - lisinopril (PRINIVIL,ZESTRIL) 20 MG tablet; Take 1 tablet (20 mg total) by mouth daily.  Dispense: 90 tablet; Refill: 1 -  hydrochlorothiazide (MICROZIDE) 12.5 MG capsule; Take 1 capsule (12.5 mg total) by mouth daily.  Dispense: 90 capsule; Refill: 1  2. Hyperlipidemia . Mild Hyperlipidemia: Discussed with patient that his cholesterol is not severely elevated but given his diabetes and his family history of CAD , I recommend a low dose statin. However he defers this--Deferred at [facility] and again today.  He is making diet changes so hopefully this will further improve.   3. Hyperglycemia A1C has remained stable for over one year. Will continue to manage with diet and exercise and will continue to monitor. Have provided him with carbohydrate information in the past and he continues to have this information.  4. Colon polyps My note in June 2013 documented that his last screening colonoscopy was performed 03/13/2006. It had revealed a polyp. We discussed he was due for followup. He discussed that his prior colonoscopy was performed by Dr. that is no longer covered by his insurance plan. His wife was scheduled for colonoscopy with a different GI doctor and he was going to decide whether to schedule followup colonoscopy but that same GI doctor or not. Today he states that he saw in his checkbook records that indicated he had a colonoscopy since the one in 2007. However he knows no further details about it than this. I told him to try to check his records further we can follow this up.  OV-- 06/11/14 -- he reports that he has a follow-up colonoscopy scheduled for 06/30/2014. ------------------------------Pt says this was normal. Thinks he was told to repeat 5 years.  4. prostate cancer screening: PSA was normal 05/2014.  5. Immunizations: A. tetanus was updated here June 2013. B. influenza vaccine: He reports that he received this Mid- October 2015 C. Zostavax: He states he did check with his insurance but they do not cover this at all. Therefore he defers this. D. Pneumonia Vaccine: At [facility] 06/11/14--he was 63 so  Prevnar 13 given then.             6-12 months later will give Pneumovax 23.  AT OV 12/04/2014---NOT QUITE 6 MONTHS SINCE PREVNAR 13 so will not give peumovax today. GIVE PNEUMOVAX 23 AT NEXT OV  Needs routine office visit 6 months or sooner if needed.     [address] [place], [place], [facility] 12/04/2014 9:49 AM

## 2015-02-11 ENCOUNTER — Emergency Department (HOSPITAL_COMMUNITY)
Admission: EM | Admit: 2015-02-11 | Discharge: 2015-02-11 | Disposition: A | Discharge status: Home / Self Care | Payer: Medicare PPO | Attending: Emergency Medicine | Admitting: Emergency Medicine

## 2015-02-11 ENCOUNTER — Encounter (HOSPITAL_COMMUNITY): Payer: Self-pay | Admitting: Neurology

## 2015-02-11 ENCOUNTER — Emergency Department (HOSPITAL_COMMUNITY): Payer: Medicare PPO

## 2015-02-11 DIAGNOSIS — N138 Other obstructive and reflux uropathy: Secondary | ICD-10-CM | POA: Diagnosis not present

## 2015-02-11 DIAGNOSIS — Z8659 Personal history of other mental and behavioral disorders: Secondary | ICD-10-CM | POA: Diagnosis not present

## 2015-02-11 DIAGNOSIS — R109 Unspecified abdominal pain: Secondary | ICD-10-CM | POA: Diagnosis not present

## 2015-02-11 DIAGNOSIS — Z86010 Personal history of colonic polyps: Secondary | ICD-10-CM | POA: Insufficient documentation

## 2015-02-11 DIAGNOSIS — E876 Hypokalemia: Secondary | ICD-10-CM | POA: Insufficient documentation

## 2015-02-11 DIAGNOSIS — Z87891 Personal history of nicotine dependence: Secondary | ICD-10-CM | POA: Insufficient documentation

## 2015-02-11 DIAGNOSIS — N200 Calculus of kidney: Secondary | ICD-10-CM | POA: Diagnosis not present

## 2015-02-11 DIAGNOSIS — I10 Essential (primary) hypertension: Secondary | ICD-10-CM | POA: Insufficient documentation

## 2015-02-11 DIAGNOSIS — Z79899 Other long term (current) drug therapy: Secondary | ICD-10-CM | POA: Insufficient documentation

## 2015-02-11 DIAGNOSIS — N210 Calculus in bladder: Secondary | ICD-10-CM | POA: Diagnosis not present

## 2015-02-11 DIAGNOSIS — K8020 Calculus of gallbladder without cholecystitis without obstruction: Secondary | ICD-10-CM | POA: Diagnosis not present

## 2015-02-11 DIAGNOSIS — Z7951 Long term (current) use of inhaled steroids: Secondary | ICD-10-CM | POA: Insufficient documentation

## 2015-02-11 DIAGNOSIS — Z7982 Long term (current) use of aspirin: Secondary | ICD-10-CM | POA: Insufficient documentation

## 2015-02-11 DIAGNOSIS — Z880 Allergy status to penicillin: Secondary | ICD-10-CM | POA: Diagnosis not present

## 2015-02-11 LAB — BASIC METABOLIC PANEL
Anion gap: 11 (ref 5–15)
BUN: 13 mg/dL (ref 6–20)
CALCIUM: 7.4 mg/dL — AB (ref 8.9–10.3)
CO2: 21 mmol/L — ABNORMAL LOW (ref 22–32)
Chloride: 106 mmol/L (ref 101–111)
Creatinine, Ser: 1.05 mg/dL (ref 0.61–1.24)
GFR calc Af Amer: >60 mL/min (ref >60)
GFR calc non Af Amer: >60 mL/min (ref >60)
Glucose, Bld: 215 mg/dL — ABNORMAL HIGH (ref 65–99)
POTASSIUM: 2.8 mmol/L — AB (ref 3.5–5.1)
SODIUM: 138 mmol/L (ref 135–145)

## 2015-02-11 LAB — CBC WITH DIFFERENTIAL/PLATELET
BASOS PCT: 0 % (ref 0–1)
Basophils Absolute: 0 10*3/uL (ref 0.0–0.1)
EOS ABS: 0.2 10*3/uL (ref 0.0–0.7)
Eosinophils Relative: 3 % (ref 0–5)
HCT: 43.9 % (ref 39.0–52.0)
Hemoglobin: 14.8 g/dL (ref 13.0–17.0)
Lymphocytes Relative: 31 % (ref 12–46)
Lymphs Abs: 2.5 10*3/uL (ref 0.7–4.0)
MCH: 30.2 pg (ref 26.0–34.0)
MCHC: 33.7 g/dL (ref 30.0–36.0)
MCV: 89.6 fL (ref 78.0–100.0)
Monocytes Absolute: 0.5 10*3/uL (ref 0.1–1.0)
Monocytes Relative: 6 % (ref 3–12)
Neutro Abs: 4.8 10*3/uL (ref 1.7–7.7)
Neutrophils Relative %: 60 % (ref 43–77)
Platelets: 159 10*3/uL (ref 150–400)
RBC: 4.9 MIL/uL (ref 4.22–5.81)
RDW: 13.6 % (ref 11.5–15.5)
WBC: 7.9 10*3/uL (ref 4.0–10.5)

## 2015-02-11 LAB — URINE MICROSCOPIC-ADD ON

## 2015-02-11 LAB — URINALYSIS, ROUTINE W REFLEX MICROSCOPIC
Bilirubin Urine: NEGATIVE
GLUCOSE, UA: 250 mg/dL — AB
KETONES UR: NEGATIVE mg/dL
LEUKOCYTES UA: NEGATIVE
NITRITE: NEGATIVE
PROTEIN: 30 mg/dL — AB
Specific Gravity, Urine: 1.015 (ref 1.005–1.030)
Urobilinogen, UA: 0.2 mg/dL (ref 0.0–1.0)
pH: 5.5 (ref 5.0–8.0)

## 2015-02-11 MED ORDER — SODIUM CHLORIDE 0.9 % IV BOLUS (SEPSIS)
500.0000 mL | Freq: Once | INTRAVENOUS | Status: AC
  Administered 2015-02-11: 500 mL via INTRAVENOUS

## 2015-02-11 MED ORDER — POTASSIUM CHLORIDE CRYS ER 20 MEQ PO TBCR
40.0000 meq | EXTENDED_RELEASE_TABLET | Freq: Once | ORAL | Status: AC
  Administered 2015-02-11: 40 meq via ORAL
  Filled 2015-02-11: qty 2

## 2015-02-11 MED ORDER — MORPHINE SULFATE (PF) 4 MG/ML IV SOLN: 4.0000 mg | INTRAVENOUS | Status: DC | PRN

## 2015-02-11 MED ORDER — HYDROCODONE-ACETAMINOPHEN 5-325 MG PO TABS: 2.0000 | ORAL_TABLET | ORAL | Status: DC | PRN

## 2015-02-11 NOTE — ED Notes (Signed)
Pt placed on 5 lead heart monitor due to potassium level, denies any cardiac symptoms.

## 2015-02-11 NOTE — ED Notes (Signed)
Pt reports right flank pain since 0400 this morning, dry heaves and emesis x 1. Pain is 4/10. Dysuria, feels unable to empty bladder.

## 2015-02-11 NOTE — ED Provider Notes (Signed)
CSN: [id number]     Arrival date & time 02/11/15  0717 History   First MD Initiated Contact with Patient 02/11/15 [phone]     Chief Complaint  Patient presents with  . Flank Pain     (Consider location/radiation/quality/duration/timing/severity/associated sxs/prior Treatment) HPI Comments: 64 year old male with history of bipolar, high blood pressure, lipids presents with right flank pain since 4:00 this morning along with nausea and vomiting 1. Patient has dribbling and difficulty emptying his bladder. This is new for the patient. No history of prostate or kidney stone issues. No abdominal surgery history. No fevers or chills. Pain gradually worsening. Mild radiation to the right flank and back area  Patient is a 64 y.o. male presenting with flank pain. The history is provided by the patient.  Flank Pain Pertinent negatives include no chest pain, no abdominal pain, no headaches and no shortness of breath.    Past Medical History  Diagnosis Date  . Allergy     mold/yeast  . Bipolar 1 disorder   . Colon polyps   . Hypertension   . Hyperlipidemia   . Hyperglycemia    History reviewed. No pertinent past surgical history. No family history on file. Social History  Substance Use Topics  . Smoking status: Former Smoker    Quit date: 04/04/1983  . Smokeless tobacco: Never Used  . Alcohol Use: 0.5 oz/week    1 drink(s) per week    Review of Systems  Constitutional: Positive for appetite change. Negative for fever and chills.  HENT: Negative for congestion.   Eyes: Negative for visual disturbance.  Respiratory: Negative for shortness of breath.   Cardiovascular: Negative for chest pain.  Gastrointestinal: Positive for nausea and vomiting. Negative for abdominal pain.  Genitourinary: Positive for flank pain and difficulty urinating. Negative for dysuria.  Musculoskeletal: Negative for back pain, neck pain and neck stiffness.  Skin: Negative for rash.  Neurological: Negative for  light-headedness and headaches.      Allergies  Asa; Other; Penicillins; and Sulfa antibiotics  Home Medications   Prior to Admission medications   Medication Sig Start Date End Date Taking? Authorizing Provider  aspirin 81 MG tablet Take 81 mg by mouth daily.   Yes Historical Provider, MD  cetirizine (ZYRTEC) 10 MG tablet Take 10 mg by mouth daily.   Yes Historical Provider, MD  fluticasone (FLONASE) 50 MCG/ACT nasal spray Place 2 sprays into both nostrils daily. 12/04/14  Yes Mary B Dixon, PA-C  hydrochlorothiazide (MICROZIDE) 12.5 MG capsule Take 1 capsule (12.5 mg total) by mouth daily. 12/04/14  Yes Mary B Dixon, PA-C  lisinopril (PRINIVIL,ZESTRIL) 20 MG tablet Take 1 tablet (20 mg total) by mouth daily. 12/04/14  Yes Orlena Sheldon, PA-C  Multiple Vitamin (MULTIVITAMIN) tablet Take 1 tablet by mouth daily.   Yes Historical Provider, MD  Omega-3 Fatty Acids (FISH OIL) 1000 MG CPDR Take 2 capsules by mouth daily.   Yes Historical Provider, MD  vitamin B-12 (CYANOCOBALAMIN) 1000 MCG tablet Take 1,000 mcg by mouth daily.   Yes Historical Provider, MD  HYDROcodone-acetaminophen (NORCO) 5-325 MG per tablet Take 2 tablets by mouth every 4 (four) hours as needed. 02/11/15   Elnora Morrison, MD   BP 163/88 mmHg  Pulse 89  Temp(Src) 97.8 F (36.6 C) (Oral)  Resp 21  Ht 5\' 10"  (1.778 m)  Wt 235 lb (106.595 kg)  BMI 33.72 kg/m2  SpO2 86% Physical Exam  Constitutional: He is oriented to person, place, and time. He appears well-developed and  well-nourished.  HENT:  Head: Normocephalic and atraumatic.  Mild dry mucous membranes  Eyes: Conjunctivae are normal. Right eye exhibits no discharge. Left eye exhibits no discharge.  Neck: Normal range of motion. Neck supple. No tracheal deviation present.  Cardiovascular: Normal rate and regular rhythm.   Pulmonary/Chest: Effort normal and breath sounds normal.  Abdominal: Soft. He exhibits no distension. There is no tenderness. There is no guarding.   Musculoskeletal: He exhibits no edema.  Neurological: He is alert and oriented to person, place, and time.  Skin: Skin is warm. No rash noted.  Psychiatric: He has a normal mood and affect.  Nursing note and vitals reviewed.   ED Course  Procedures (including critical care time) EMERGENCY DEPARTMENT ULTRASOUND  Study: Limited Retroperitoneal Ultrasound of the Abdominal Aorta.  INDICATIONS:Back pain and Age>55 Multiple views of the abdominal aorta were obtained in real-time from the diaphragmatic hiatus to the aortic bifurcation in transverse planes with a multi-frequency probe. PERFORMED BY: Myself IMAGES ARCHIVED?: Yes FINDINGS: Maximum aortic dimensions are 2.2 cm LIMITATIONS:  Bowel gas INTERPRETATION:  No abdominal aortic aneurysm   CPT Code: [id number] (limited retroperitoneal) EMERGENCY DEPARTMENT US RENAL EXAM  "Study: Limited Retroperitoneal Ultrasound of Kidneys"  INDICATIONS: Flank pain  Long and short axis of both kidneys were obtained.   PERFORMED BY: Myself  IMAGES ARCHIVED?: Yes  LIMITATIONS: Body habitus  VIEWS USED: Long axis and Short axis   INTERPRETATION: Right Hydronephrosis mild   CPT Code: [phone] (limited retroperitoneal)    Labs Review Labs Reviewed  BASIC METABOLIC PANEL - Abnormal; Notable for the following:    Potassium 2.8 (*)    CO2 21 (*)    Glucose, Bld 215 (*)    Calcium 7.4 (*)    All other components within normal limits  URINALYSIS, ROUTINE W REFLEX MICROSCOPIC (NOT AT [facility]) - Abnormal; Notable for the following:    Glucose, UA 250 (*)    Hgb urine dipstick LARGE (*)    Protein, ur 30 (*)    All other components within normal limits  URINE CULTURE  CBC WITH DIFFERENTIAL/PLATELET  URINE MICROSCOPIC-ADD ON    Imaging Review No results found. I have personally reviewed and evaluated these images and lab results as part of my medical decision-making. Ct Renal Stone Study  02/11/2015   CLINICAL DATA:  64 year old male  with severe right flank pain radiating to the groin upon awakening. Difficulty urinating. Initial encounter.  EXAM: CT ABDOMEN AND PELVIS WITHOUT CONTRAST  TECHNIQUE: Multidetector CT imaging of the abdomen and pelvis was performed following the standard protocol without IV contrast.  COMPARISON:  Abdominal ultrasound 12/31/2010  FINDINGS: Mild cardiomegaly. No pericardial or pleural effusion. Negative lung bases.  There is are ununited chronic appearing fracture of the transverse processes of the right L3 and L4 vertebrae (series 2, image 48). Widespread lumbar spine degeneration. Multifactorial moderate to severe spinal stenosis in the lower lumbar spine. No acute osseous abnormality identified.  No pelvic free fluid. Negative distal colon. Redundant but decompressed sigmoid colon. Descending colon, transverse colon and right colon are negative and mostly decompressed. Normal appendix. Negative terminal ileum. No dilated small bowel.  Negative non contrast stomach, duodenum, liver, spleen, pancreas and adrenal glands. Aortoiliac calcified atherosclerosis noted.  Cholelithiasis measuring 9 mm. No pericholecystic inflammation. No abdominal free fluid or free air.  Exophytic simple appearing left renal cyst at the midpole, otherwise negative non contrast left kidney and left ureter.  On the right side there is moderate hydronephrosis and perinephric  stranding with trace para renal space fluid. Punctate nonobstructing calculi at the right renal mid and lower pole. Right hydroureter and periureteral stranding to the ureterovesical junction where a 5 mm calculus is located just inside the bladder on series 2, image 90. The bladder is diminutive and otherwise unremarkable.  IMPRESSION: 1. Obstructive uropathy on the right with probable forniceal rupture rupture. 5 mm calculus located just inside the bladder near the right UVJ. Punctate additional right nephrolithiasis. 2. Cholelithiasis. 3. Posttraumatic and degenerative  lumbar spine changes. Moderate to severe lower lumbar spinal stenosis.   Electronically Signed   By: Genevie Ann M.D.   On: 02/11/2015 10:27     EKG Interpretation None      MDM   Final diagnoses:  Acute right flank pain  Right kidney stone  Hypokalemia   Patient presents with moderate right flank pain without focal tenderness on exam. Concern for kidney stones based on presentation. Bedside ultrasound very mild proximal ureter hydro, no significant amount of urine in the bladder, aorta normal size. With pain the right lower flank no history of similar discussed risks and benefits of CT scan for further delineation confirmation of clinical suspicion. CT scan pending. Potassium mild low oral place. Discussed with urology for outpt fup.  Results and differential diagnosis were discussed with the patient/parent/guardian. Xrays were independently reviewed by myself.  Close follow up outpatient was discussed, comfortable with the plan.   Medications  sodium chloride 0.9 % bolus 500 mL (0 mLs Intravenous Stopped 02/11/15 1048)  potassium chloride SA (K-DUR,KLOR-CON) CR tablet 40 mEq (40 mEq Oral Given 02/11/15 0834)    Filed Vitals:   02/11/15 1045 02/11/15 1106 02/11/15 1115 02/11/15 1130  BP: 164/83 183/96 170/94 163/88  Pulse: 66 64 67 89  Temp:      TempSrc:      Resp: 19 19 21 21   Height:      Weight:      SpO2: 98% 98% 97% 86%    Final diagnoses:  Acute right flank pain  Right kidney stone  Hypokalemia      Elnora Morrison, MD 02/13/15 1635

## 2015-02-11 NOTE — Discharge Instructions (Signed)
If you were given medicines take as directed.  If you are on coumadin or contraceptives realize their levels and effectiveness is altered by many different medicines.  If you have any reaction (rash, tongues swelling, other) to the medicines stop taking and see a physician.    If your blood pressure was elevated in the ER make sure you follow up for management with a primary doctor or return for chest pain, shortness of breath or stroke symptoms.  Please follow up as directed and return to the ER or see a physician for new or worsening symptoms.  Thank you. Filed Vitals:   02/11/15 0724 02/11/15 0726  BP: 188/96 188/96  Pulse:  63  Temp:  97.8 F (36.6 C)  TempSrc:  Oral  Resp:  14  Height:  5\' 10"  (1.778 m)  Weight:  235 lb (106.595 kg)  SpO2:  98%

## 2015-02-12 LAB — URINE CULTURE: CULTURE: NO GROWTH

## 2015-06-18 ENCOUNTER — Encounter: Payer: Self-pay | Admitting: Physician Assistant

## 2015-06-19 ENCOUNTER — Encounter: Payer: Self-pay | Admitting: Family Medicine

## 2015-06-19 ENCOUNTER — Ambulatory Visit (INDEPENDENT_AMBULATORY_CARE_PROVIDER_SITE_OTHER): Payer: Medicare Other | Admitting: Family Medicine

## 2015-06-19 VITALS — BP 138/86 | HR 80 | Temp 98.0°F | Resp 18 | Ht 69.0 in | Wt 220.0 lb

## 2015-06-19 DIAGNOSIS — J019 Acute sinusitis, unspecified: Secondary | ICD-10-CM

## 2015-06-19 MED ORDER — AZITHROMYCIN 250 MG PO TABS: ORAL_TABLET | ORAL | Status: DC

## 2015-06-19 NOTE — Progress Notes (Signed)
Subjective:    Patient ID: Daniel Blackwell, male    DOB: 21-Jan-1951, 65 y.o.   MRN: [id number]  HPI Symptoms began more than 2 weeks ago with a viral upper respiratory infection. Unfortunately over the last 2 weeks it has progressed into a sinus infection. He reports pain and pressure in both maxillary sinuses. He reports pain in his teeth. He reports a dull constant pressure in his frontal sinuses. He reports daily postnasal drip and rhinorrhea. He reports subjective fevers. Past Medical History  Diagnosis Date  . Allergy     mold/yeast  . Bipolar 1 disorder ([place])   . Colon polyps   . Hypertension   . Hyperlipidemia   . Hyperglycemia    No past surgical history on file. Current Outpatient Prescriptions on File Prior to Visit  Medication Sig Dispense Refill  . aspirin 81 MG tablet Take 81 mg by mouth daily.    . hydrochlorothiazide (MICROZIDE) 12.5 MG capsule Take 1 capsule (12.5 mg total) by mouth daily. 90 capsule 1  . lisinopril (PRINIVIL,ZESTRIL) 20 MG tablet Take 1 tablet (20 mg total) by mouth daily. 90 tablet 1  . Multiple Vitamin (MULTIVITAMIN) tablet Take 1 tablet by mouth daily.    . Omega-3 Fatty Acids (FISH OIL) 1000 MG CPDR Take 2 capsules by mouth daily.    . vitamin B-12 (CYANOCOBALAMIN) 1000 MCG tablet Take 1,000 mcg by mouth daily.    . cetirizine (ZYRTEC) 10 MG tablet Take 10 mg by mouth daily. Reported on 06/19/2015    . fluticasone (FLONASE) 50 MCG/ACT nasal spray Place 2 sprays into both nostrils daily. (Patient not taking: Reported on 06/19/2015) 16 g 6   No current facility-administered medications on file prior to visit.   Allergies  Allergen Reactions  . Asa [Aspirin] Swelling  . Other Swelling    CASHEWS  . Penicillins Rash  . Sulfa Antibiotics Rash   Social History   Social History  . Marital Status: Married    Spouse Name: N/A  . Number of Children: N/A  . Years of Education: N/A   Occupational History  . State, [place]    Social History Main Topics  . Smoking status: Former Smoker    Quit date: 04/04/1983  . Smokeless tobacco: Never Used  . Alcohol Use: 0.5 oz/week    1 drink(s) per week  . Drug Use: No  . Sexual Activity: Not on file   Other Topics Concern  . 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   At certain times of the year he works long hours   but then at certain times of the year works a few hours.      Review of Systems  All other systems reviewed and are negative.      Objective:   Physical Exam  HENT:  Right Ear: Tympanic membrane, external ear and ear canal normal.  Left Ear: Tympanic membrane, external ear and ear canal normal.  Nose: Mucosal edema and rhinorrhea present. Right sinus exhibits maxillary sinus tenderness. Left sinus exhibits maxillary sinus tenderness.  Mouth/Throat: Oropharynx is clear and moist. No oropharyngeal exudate.  Eyes: Conjunctivae are normal.  Neck: Neck supple.  Cardiovascular: Normal rate, regular rhythm and normal heart sounds.   Pulmonary/Chest: Effort normal and breath sounds normal. No respiratory distress. He  has no wheezes. He has no rales.  Lymphadenopathy:    He has no cervical adenopathy.  Vitals reviewed.         Assessment & Plan:  Acute rhinosinusitis - Plan: azithromycin (ZITHROMAX) 250 MG tablet  Begin a Z-Pak due to his penicillin allergy. Use nasal saline 4 times a day. Recheck if not better in 1 week

## 2016-01-20 ENCOUNTER — Other Ambulatory Visit: Payer: Self-pay | Admitting: Physician Assistant

## 2016-01-20 ENCOUNTER — Other Ambulatory Visit: Payer: Medicare Other

## 2016-01-20 DIAGNOSIS — E785 Hyperlipidemia, unspecified: Secondary | ICD-10-CM

## 2016-01-20 DIAGNOSIS — R739 Hyperglycemia, unspecified: Secondary | ICD-10-CM

## 2016-01-20 DIAGNOSIS — Z125 Encounter for screening for malignant neoplasm of prostate: Secondary | ICD-10-CM

## 2016-01-20 DIAGNOSIS — Z79899 Other long term (current) drug therapy: Secondary | ICD-10-CM

## 2016-01-20 DIAGNOSIS — I10 Essential (primary) hypertension: Secondary | ICD-10-CM

## 2016-01-20 DIAGNOSIS — F319 Bipolar disorder, unspecified: Secondary | ICD-10-CM

## 2016-01-20 LAB — CBC WITH DIFFERENTIAL/PLATELET
BASOS ABS: 0 {cells}/uL (ref 0–200)
BASOS PCT: 0 %
EOS ABS: 368 {cells}/uL (ref 15–500)
Eosinophils Relative: 4 %
HEMATOCRIT: 50.4 % — AB (ref 38.5–50.0)
HEMOGLOBIN: 17.2 g/dL — AB (ref 13.0–17.0)
Lymphocytes Relative: 39 %
Lymphs Abs: 3588 cells/uL (ref 850–3900)
MCH: 31.6 pg (ref 27.0–33.0)
MCHC: 34.1 g/dL (ref 32.0–36.0)
MCV: 92.5 fL (ref 80.0–100.0)
MONO ABS: 736 {cells}/uL (ref 200–950)
MONOS PCT: 8 %
MPV: 10.3 fL (ref 7.5–12.5)
NEUTROS ABS: 4508 {cells}/uL (ref 1500–7800)
Neutrophils Relative %: 49 %
Platelets: 171 10*3/uL (ref 140–400)
RBC: 5.45 MIL/uL (ref 4.20–5.80)
RDW: 13.8 % (ref 11.0–15.0)
WBC: 9.2 10*3/uL (ref 3.8–10.8)

## 2016-01-20 LAB — PSA: PSA: 0.2 ng/mL (ref <=4.0)

## 2016-01-20 LAB — TSH: TSH: 3.41 m[IU]/L (ref 0.40–4.50)

## 2016-01-21 LAB — LIPID PANEL
CHOLESTEROL: 218 mg/dL — AB (ref 125–200)
HDL: 52 mg/dL (ref >=40)
LDL Cholesterol: 103 mg/dL (ref <130)
TRIGLYCERIDES: 314 mg/dL — AB (ref <150)
Total CHOL/HDL Ratio: 4.2 Ratio (ref <=5.0)
VLDL: 63 mg/dL — AB (ref <30)

## 2016-01-21 LAB — HEMOGLOBIN A1C
Hgb A1c MFr Bld: 6.1 % — ABNORMAL HIGH (ref <5.7)
Mean Plasma Glucose: 128 mg/dL

## 2016-01-21 LAB — COMPLETE METABOLIC PANEL WITH GFR
ALT: 45 U/L (ref 9–46)
AST: 38 U/L — ABNORMAL HIGH (ref 10–35)
Albumin: 4.6 g/dL (ref 3.6–5.1)
Alkaline Phosphatase: 62 U/L (ref 40–115)
BILIRUBIN TOTAL: 1.1 mg/dL (ref 0.2–1.2)
BUN: 18 mg/dL (ref 7–25)
CALCIUM: 10 mg/dL (ref 8.6–10.3)
CHLORIDE: 99 mmol/L (ref 98–110)
CO2: 27 mmol/L (ref 20–31)
CREATININE: 1.26 mg/dL — AB (ref 0.70–1.25)
GFR, EST AFRICAN AMERICAN: 68 mL/min (ref >=60)
GFR, EST NON AFRICAN AMERICAN: 59 mL/min — AB (ref >=60)
Glucose, Bld: 109 mg/dL — ABNORMAL HIGH (ref 70–99)
Potassium: 4.8 mmol/L (ref 3.5–5.3)
Sodium: 140 mmol/L (ref 135–146)
TOTAL PROTEIN: 7.4 g/dL (ref 6.1–8.1)

## 2016-01-25 ENCOUNTER — Ambulatory Visit (INDEPENDENT_AMBULATORY_CARE_PROVIDER_SITE_OTHER): Payer: Medicare Other | Admitting: Physician Assistant

## 2016-01-25 ENCOUNTER — Encounter: Payer: Self-pay | Admitting: Physician Assistant

## 2016-01-25 VITALS — BP 150/98 | HR 86 | Temp 98.4°F | Resp 14 | Ht 69.0 in | Wt 222.0 lb

## 2016-01-25 DIAGNOSIS — I10 Essential (primary) hypertension: Secondary | ICD-10-CM

## 2016-01-25 DIAGNOSIS — Z23 Encounter for immunization: Secondary | ICD-10-CM

## 2016-01-25 MED ORDER — LISINOPRIL 20 MG PO TABS: 20.0000 mg | ORAL_TABLET | Freq: Every day | ORAL | 1 refills | Status: DC

## 2016-01-25 MED ORDER — HYDROCHLOROTHIAZIDE 12.5 MG PO CAPS: 12.5000 mg | ORAL_CAPSULE | Freq: Every day | ORAL | 1 refills | Status: DC

## 2016-01-25 NOTE — Progress Notes (Signed)
Patient ID: Daniel Blackwell MRN: [id number], DOB: December 09, 1950, 65 y.o. Date of Encounter: @DATE @  Chief Complaint:  Chief Complaint  Patient presents with  . Follow-up    Lab results and med refills    HPI: 65 y.o. year old white male  presents for followup office visit.  12/04/2014: He is taking his blood pressure medications as directed with no adverse effects. He has no complaints today.  At prior OV, he said that he had been trying to make some diet changes.  Said he was eating [employer] sausage biscuit 1 or 2 mornings per week but stopped this at the end of November 2015. Also said he had gotten in the habit of eating ice cream at night and he had stopped this as well. He has also been trying to change to leaner meats.  Says that he is stayed off of the biscuits. Has stayed away from the habit of ice cream every night but does occasionally have some ice cream.  01/25/2016: He states that his blood pressure medications ran out about 3 days ago. Otherwise up until then had been taking them as directed with no adverse effects. He walks 1 mile every other day. He continues to be careful with his diet and eating low carbohydrate diet. He checks blood sugar about once a week fasting in the morning. Gets around 107, 111, 102.   No complaints or concerns today.   Past Medical History:  Diagnosis Date  . Allergy    mold/yeast  . Bipolar 1 disorder ([place])   . Colon polyps   . Hyperglycemia   . Hyperlipidemia   . Hypertension      Home Meds: Outpatient Medications Prior to Visit  Medication Sig Dispense Refill  . aspirin 81 MG tablet Take 81 mg by mouth daily.    . cetirizine (ZYRTEC) 10 MG tablet Take 10 mg by mouth daily. Reported on 06/19/2015    . hydrochlorothiazide (MICROZIDE) 12.5 MG capsule Take 1 capsule (12.5 mg total) by mouth daily. 90 capsule 1  . lisinopril (PRINIVIL,ZESTRIL) 20 MG tablet Take 1 tablet (20 mg total) by mouth daily. 90 tablet 1  . Multiple  Vitamin (MULTIVITAMIN) tablet Take 1 tablet by mouth daily.    . Omega-3 Fatty Acids (FISH OIL) 1000 MG CPDR Take 2 capsules by mouth daily.    . vitamin B-12 (CYANOCOBALAMIN) 1000 MCG tablet Take 1,000 mcg by mouth daily.    Marland Kitchen azithromycin (ZITHROMAX) 250 MG tablet 2 tabs poqday1, 1 tab poqday 2-5 (Patient not taking: Reported on 01/25/2016) 6 tablet 0  . fluticasone (FLONASE) 50 MCG/ACT nasal spray Place 2 sprays into both nostrils daily. (Patient not taking: Reported on 06/19/2015) 16 g 6   No facility-administered medications prior to visit.      Allergies:  Allergies  Allergen Reactions  . Asa [Aspirin] Swelling  . Other Swelling    CASHEWS  . Penicillins Rash  . Sulfa Antibiotics Rash    Social History   Social History  . Marital status: Married    Spouse name: N/A  . Number of children: N/A  . Years of education: N/A   Occupational History  . State, [place]   Social History Main Topics  . Smoking status: Former Smoker    Quit date: 04/04/1983  . Smokeless tobacco: Never Used  . Alcohol use 0.5 oz/week    1 drink(s) per week  . Drug use: No  . Sexual activity: Not  on file   Other Topics Concern  . 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   At certain times of the year he works long hours   but then at certain times of the year works a few hours.    No family history on file.   Review of Systems:  See HPI for pertinent ROS. All other ROS negative.    Physical Exam: Blood pressure (!) 150/98, pulse 86, temperature 98.4 F (36.9 C), temperature source Oral, resp. rate 14, height 5' 9"  (1.753 m), weight 222 lb (100.7 kg), SpO2 98 %., Body mass index is 32.78 kg/m. General: WNWD WM. Appears in no acute distress. Neck: Supple. No thyromegaly. No lymphadenopathy. No carotid bruits. Lungs: Clear bilaterally to  auscultation without wheezes, rales, or rhonchi. Breathing is unlabored. Heart: RRR with S1 S2. No murmurs, rubs, or gallops. Abdomen: Soft, non-tender, non-distended with normoactive bowel sounds. No hepatomegaly. No rebound/guarding. No obvious abdominal masses. Musculoskeletal:  Strength and tone normal for age. Extremities/Skin: Warm and dry.  No edema.  Neuro: Alert and oriented X 3. Moves all extremities spontaneously. Gait is normal. CNII-XII grossly in tact. Psych:  Responds to questions appropriately with a normal affect.   Results for orders placed or performed in visit on 01/20/16  COMPLETE METABOLIC PANEL WITH GFR  Result Value Ref Range   Sodium 140 135 - 146 mmol/L   Potassium 4.8 3.5 - 5.3 mmol/L   Chloride 99 98 - 110 mmol/L   CO2 27 20 - 31 mmol/L   Glucose, Bld 109 (H) 70 - 99 mg/dL   BUN 18 7 - 25 mg/dL   Creat 1.26 (H) 0.70 - 1.25 mg/dL   Total Bilirubin 1.1 0.2 - 1.2 mg/dL   Alkaline Phosphatase 62 40 - 115 U/L   AST 38 (H) 10 - 35 U/L   ALT 45 9 - 46 U/L   Total Protein 7.4 6.1 - 8.1 g/dL   Albumin 4.6 3.6 - 5.1 g/dL   Calcium 10.0 8.6 - 10.3 mg/dL   GFR, Est African American 68 >=60 mL/min   GFR, Est Non African American 59 (L) >=60 mL/min  TSH  Result Value Ref Range   TSH 3.41 0.40 - 4.50 mIU/L  Lipid panel  Result Value Ref Range   Cholesterol 218 (H) 125 - 200 mg/dL   Triglycerides 314 (H) <150 mg/dL   HDL 52 >=40 mg/dL   Total CHOL/HDL Ratio 4.2 <=5.0 Ratio   VLDL 63 (H) <30 mg/dL   LDL Cholesterol 103 <130 mg/dL  CBC with Differential/Platelet  Result Value Ref Range   WBC 9.2 3.8 - 10.8 K/uL   RBC 5.45 4.20 - 5.80 MIL/uL   Hemoglobin 17.2 (H) 13.0 - 17.0 g/dL   HCT 50.4 (H) 38.5 - 50.0 %   MCV 92.5 80.0 - 100.0 fL   MCH 31.6 27.0 - 33.0 pg   MCHC 34.1 32.0 - 36.0 g/dL   RDW 13.8 11.0 - 15.0 %   Platelets 171 140 - 400 K/uL   MPV 10.3 7.5 - 12.5 fL   Neutro Abs 4,508 1,500 - 7,800 cells/uL   Lymphs Abs 3,588 850 - 3,900 cells/uL    Monocytes Absolute 736 200 - 950 cells/uL   Eosinophils Absolute 368 15 - 500 cells/uL   Basophils Absolute 0 0 - 200 cells/uL  Neutrophils Relative % 49 %   Lymphocytes Relative 39 %   Monocytes Relative 8 %   Eosinophils Relative 4 %   Basophils Relative 0 %   Smear Review Criteria for review not met   Hemoglobin A1c  Result Value Ref Range   Hgb A1c MFr Bld 6.1 (H) <5.7 %   Mean Plasma Glucose 128 mg/dL  PSA  Result Value Ref Range   PSA 0.2 <=4.0 ng/mL     ASSESSMENT AND PLAN:  65 y.o. year old male with   1. Essential hypertension  Blood pressure Rechecked by me and I'm getting 160/100. He then states that he has been out of medication and has not had medicines for the past 3 days. He is to resume prior blood pressure medications. - BASIC METABOLIC PANEL WITH GFR - lisinopril (PRINIVIL,ZESTRIL) 20 MG tablet; Take 1 tablet (20 mg total) by mouth daily.  Dispense: 90 tablet; Refill: 1 - hydrochlorothiazide (MICROZIDE) 12.5 MG capsule; Take 1 capsule (12.5 mg total) by mouth daily.  Dispense: 90 capsule; Refill: 1  2. Hyperlipidemia . Mild Hyperlipidemia: 12/04/2014:  Discussed with patient that his cholesterol is not severely elevated but given his diabetes and his family history of CAD , I recommend a low dose statin.                    However he defers this--Deferred at [place] and again today.  He is making diet changes so hopefully this will further improve. 01/25/2016: Lipid panel performed 01/20/16 is stable. Can remain off statin.   3. Hyperglycemia A1C has remained stable for over one year. Will continue to manage with diet and exercise and will continue to monitor. Have provided him with carbohydrate information in the past and he continues to have this information.  4. Colon polyps My note in June 2013 documented that his last screening colonoscopy was performed 03/13/2006. It had revealed a polyp. We discussed he was due for followup. He discussed that his prior  colonoscopy was performed by Dr. that is no longer covered by his insurance plan. His wife was scheduled for colonoscopy with a different GI doctor and he was going to decide whether to schedule followup colonoscopy but that same GI doctor or not. Today he states that he saw in his checkbook records that indicated he had a colonoscopy since the one in 2007. However he knows no further details about it than this. I told him to try to check his records further we can follow this up.  OV-- 06/11/14 -- he reports that he has a follow-up colonoscopy scheduled for 06/30/2014. ------------------------------Pt says this was normal. Thinks he was told to repeat 5 years.  4. prostate cancer screening: PSA was normal 05/2014, 01/20/2016  5. Immunizations: A. tetanus was updated here June 2013. B. influenza vaccine: He reports that he received this Mid- October 2015 C. Zostavax: He states he did check with his insurance but they do not cover this at all. Therefore he defers this. D. Pneumonia Vaccine: At [facility] 06/11/14--he was 63 so Prevnar 13 given then.                                      01/25/2016----Pneumovax 23 given here  Subjective:   Patient presents for Medicare Annual/Subsequent preventive examination.   Review Past Medical/Family/Social: This information is all reviewed and documented today.  Risk Factors  Current exercise  habits: Walks 1 mile every other day. Dietary issues discussed: Eats a low carbohydrate low cholesterol diet.  Cardiac risk factors: Diabetes, hypertension, family history  Depression Screen  (Note: if answer to either of the following is "Yes", a more complete depression screening is indicated)  Over the past two weeks, have you felt down, depressed or hopeless? No Over the past two weeks, have you felt little interest or pleasure in doing things? No Have you lost interest or pleasure in daily life? No Do you often feel hopeless? No Do you cry easily over simple  problems? No   Activities of Daily Living  In your present state of health, do you have any difficulty performing the following activities?:  Driving? No  Managing money? No  Feeding yourself? No  Getting from bed to chair? No  Climbing a flight of stairs? No  Preparing food and eating?: No  Bathing or showering? No  Getting dressed: No  Getting to the toilet? No  Using the toilet:No  Moving around from place to place: No  In the past year have you fallen or had a near fall?:No  Are you sexually active? No  Do you have more than one partner? No   Hearing Difficulties: No  Do you often ask people to speak up or repeat themselves? No  Do you experience ringing or noises in your ears? No Do you have difficulty understanding soft or whispered voices? No  Do you feel that you have a problem with memory? No Do you often misplace items? No  Do you feel safe at home? Yes  Cognitive Testing  Alert? Yes Normal Appearance?Yes  Oriented to person? Yes Place? Yes  Time? Yes  Recall of three objects? Yes  Can perform simple calculations? Yes  Displays appropriate judgment?Yes  Can read the correct time from a watch face?Yes   List the Names of Other Physician/Practitioners you currently use:    Indicate any recent Medical Services you may have received from other than [facility] providers in the past year (date may be approximate).   Screening Tests / Date----all of this information is documented above. Colonoscopy                     Zostavax  Mammogram  Influenza Vaccine  Tetanus/tdap    Assessment:    Annual wellness medicare exam   Plan:    During the course of the visit the patient was educated and counseled about appropriate screening and preventive services including:  Screening mammography  Colorectal cancer screening  Shingles vaccine. Prescription given to that she can get the vaccine at the pharmacy or Medicare part D.  Screen + for depression. PHQ- 9 score of 12  (moderate depression). We discussed the options of counseling versus possibly a medication. I encouraged her strongly think about the counseling. She is going through some medical problems currently and her husband is as well Mrs. been very stressful for her. She says she will think about it. She does have Xanax to use as needed. Though she may benefit from an SSRI for her more depressive type symptoms but she wants to hold off at this time.  I aksed her to please have her cardioloist send records since we have none on file.  Diet review for nutrition referral? Yes ____ Not Indicated __x__  Patient Instructions (the written plan) was given to the patient.  Medicare Attestation  I have personally reviewed:  The patient's medical and social history  Their use of alcohol, tobacco or illicit drugs  Their current medications and supplements  The patient's functional ability including ADLs,fall risks, home safety risks, cognitive, and hearing and visual impairment  Diet and physical activities  Evidence for depression or mood disorders  The patient's weight, height, BMI, and visual acuity have been recorded in the chart. I have made referrals, counseling, and provided education to the patient based on review of the above and I have provided the patient with a written personalized care plan for preventive services.         [address] [place], [place], [facility] 01/25/2016 2:55 PM

## 2016-01-27 LAB — HEPATITIS C ANTIBODY: HCV Ab: NEGATIVE

## 2016-05-18 ENCOUNTER — Ambulatory Visit (INDEPENDENT_AMBULATORY_CARE_PROVIDER_SITE_OTHER): Payer: Medicare Other | Admitting: Physician Assistant

## 2016-05-18 ENCOUNTER — Encounter: Payer: Self-pay | Admitting: Physician Assistant

## 2016-05-18 VITALS — BP 140/82 | HR 68 | Temp 97.6°F | Resp 16 | Wt 219.0 lb

## 2016-05-18 DIAGNOSIS — Z23 Encounter for immunization: Secondary | ICD-10-CM | POA: Diagnosis not present

## 2016-05-18 NOTE — Progress Notes (Signed)
Pt was not due for office visit.

## 2016-09-15 ENCOUNTER — Other Ambulatory Visit: Payer: Self-pay | Admitting: Physician Assistant

## 2016-09-15 DIAGNOSIS — I10 Essential (primary) hypertension: Secondary | ICD-10-CM

## 2016-09-15 NOTE — Telephone Encounter (Signed)
Refill appropriate 

## 2016-11-15 ENCOUNTER — Other Ambulatory Visit: Payer: Medicare Other

## 2016-11-15 DIAGNOSIS — E785 Hyperlipidemia, unspecified: Secondary | ICD-10-CM

## 2016-11-15 DIAGNOSIS — R739 Hyperglycemia, unspecified: Secondary | ICD-10-CM

## 2016-11-15 DIAGNOSIS — I10 Essential (primary) hypertension: Secondary | ICD-10-CM

## 2016-11-15 LAB — CBC WITH DIFFERENTIAL/PLATELET
BASOS ABS: 0 {cells}/uL (ref 0–200)
Basophils Relative: 0 %
Eosinophils Absolute: 312 cells/uL (ref 15–500)
Eosinophils Relative: 4 %
HCT: 47.1 % (ref 38.5–50.0)
HEMOGLOBIN: 15.9 g/dL (ref 13.0–17.0)
LYMPHS ABS: 3120 {cells}/uL (ref 850–3900)
Lymphocytes Relative: 40 %
MCH: 31 pg (ref 27.0–33.0)
MCHC: 33.8 g/dL (ref 32.0–36.0)
MCV: 91.8 fL (ref 80.0–100.0)
MPV: 10.6 fL (ref 7.5–12.5)
Monocytes Absolute: 624 cells/uL (ref 200–950)
Monocytes Relative: 8 %
NEUTROS ABS: 3744 {cells}/uL (ref 1500–7800)
Neutrophils Relative %: 48 %
Platelets: 151 10*3/uL (ref 140–400)
RBC: 5.13 MIL/uL (ref 4.20–5.80)
RDW: 13.9 % (ref 11.0–15.0)
WBC: 7.8 10*3/uL (ref 3.8–10.8)

## 2016-11-16 LAB — COMPREHENSIVE METABOLIC PANEL
ALBUMIN: 4.1 g/dL (ref 3.6–5.1)
ALK PHOS: 57 U/L (ref 40–115)
ALT: 44 U/L (ref 9–46)
AST: 34 U/L (ref 10–35)
BILIRUBIN TOTAL: 0.8 mg/dL (ref 0.2–1.2)
BUN: 15 mg/dL (ref 7–25)
CHLORIDE: 102 mmol/L (ref 98–110)
CO2: 24 mmol/L (ref 20–31)
CREATININE: 1.14 mg/dL (ref 0.70–1.25)
Calcium: 9.6 mg/dL (ref 8.6–10.3)
Glucose, Bld: 115 mg/dL — ABNORMAL HIGH (ref 70–99)
Potassium: 4.2 mmol/L (ref 3.5–5.3)
SODIUM: 141 mmol/L (ref 135–146)
TOTAL PROTEIN: 7.1 g/dL (ref 6.1–8.1)

## 2016-11-16 LAB — LIPID PANEL
CHOLESTEROL: 198 mg/dL (ref <200)
HDL: 48 mg/dL (ref >40)
LDL Cholesterol: 96 mg/dL (ref <100)
TRIGLYCERIDES: 270 mg/dL — AB (ref <150)
Total CHOL/HDL Ratio: 4.1 Ratio (ref <5.0)
VLDL: 54 mg/dL — ABNORMAL HIGH (ref <30)

## 2016-11-16 LAB — HEMOGLOBIN A1C
Hgb A1c MFr Bld: 6.5 % — ABNORMAL HIGH (ref <5.7)
Mean Plasma Glucose: 140 mg/dL

## 2016-11-23 ENCOUNTER — Encounter: Payer: Self-pay | Admitting: Physician Assistant

## 2016-11-23 ENCOUNTER — Ambulatory Visit (INDEPENDENT_AMBULATORY_CARE_PROVIDER_SITE_OTHER): Payer: Medicare Other | Admitting: Physician Assistant

## 2016-11-23 VITALS — BP 138/82 | HR 65 | Temp 97.6°F | Resp 16 | Ht 70.0 in | Wt 223.0 lb

## 2016-11-23 DIAGNOSIS — R739 Hyperglycemia, unspecified: Secondary | ICD-10-CM

## 2016-11-23 DIAGNOSIS — E785 Hyperlipidemia, unspecified: Secondary | ICD-10-CM | POA: Diagnosis not present

## 2016-11-23 DIAGNOSIS — I10 Essential (primary) hypertension: Secondary | ICD-10-CM

## 2016-11-24 NOTE — Progress Notes (Signed)
Patient ID: AHIJAH DEVERY MRN: [id number], DOB: 1950-06-18, 66 y.o. Date of Encounter: @DATE @  Chief Complaint:  Chief Complaint  Patient presents with  . Hypertension  . Hyperlipidemia  . Hyperglycemia    HPI: 66 y.o. year old male  presents for OV to f/u hypertension, hyperlipidemia, hyperglycemia.  He recently came in had fasting labs and is here to review those findings.  Prior set of labs had been on 01/20/16 and this most recent set was 11/15/16. Reviewed that A1c had been 6.1--8/17 and now 6.5. He states that he had been checking his blood sugar fasting in the morning about once a week and was getting 105 to 1:15. Says if he had eaten pasta the night before it would be more like 124. Says that then his test strips ran out and ended up having to get a new meter and new test strips and since then has been getting readings of about 120s to 130s. Since these more recent readings have been a little higher schedule today's visit. Says that his daughter and granddaughter live with them in a been living with them for about 2 years so they do sometimes have pasta and rice but otherwise he tries to be extremely careful with his carb intake. States that all of his family members were on medication for diabetes well before his current age and most were on insulin by this age.  Also discussed his lipid profile. He does not want to start on any lipid medication. Discussed that overall his lipids are excellent but when someone is diagnosed with diabetes the goal LDL is down to 70 he is aware of this but does not want to start on cholesterol medication. Will continue to keep treat this with diet and exercise.  He does continue to walk for exercise daily.  He states that he has an follow-up eye exam scheduled for July.  He is taking blood pressure medications as directed. Having no lightheadedness or other adverse effects.  Even with his walks and physical exertion he is having no angina  symptoms. No increased amount of shortness of breath last [name] on exertion. No chest pressure heaviness tightness or squeezing.   Past Medical History:  Diagnosis Date  . Allergy    mold/yeast  . Bipolar 1 disorder ([place])   . Colon polyps   . Hyperglycemia   . Hyperlipidemia   . Hypertension      Home Meds: Outpatient Medications Prior to Visit  Medication Sig Dispense Refill  . aspirin 81 MG tablet Take 81 mg by mouth daily.    . cetirizine (ZYRTEC) 10 MG tablet Take 10 mg by mouth daily. Reported on 06/19/2015    . hydrochlorothiazide (MICROZIDE) 12.5 MG capsule TAKE 1 CAPSULE(12.5 MG) BY MOUTH DAILY 90 capsule 1  . lisinopril (PRINIVIL,ZESTRIL) 20 MG tablet TAKE 1 TABLET(20 MG) BY MOUTH DAILY 90 tablet 1  . Multiple Vitamin (MULTIVITAMIN) tablet Take 1 tablet by mouth daily.    . Omega-3 Fatty Acids (FISH OIL) 1000 MG CPDR Take 2 capsules by mouth daily.    . vitamin B-12 (CYANOCOBALAMIN) 1000 MCG tablet Take 1,000 mcg by mouth daily.      No facility-administered medications prior to visit.     Allergies:  Allergies  Allergen Reactions  . Asa [Aspirin] Swelling  . Other Swelling    CASHEWS  . Penicillins Rash  . Sulfa Antibiotics Rash    Social History   Social History  . Marital status: Married  Spouse name: N/A  . Number of children: N/A  . Years of education: N/A   Occupational History  . State, [employer]   Social History Main Topics  . Smoking status: Former Smoker    Quit date: 04/04/1983  . Smokeless tobacco: Never Used  . Alcohol use 0.5 oz/week    1 drink(s) per week  . Drug use: No  . Sexual activity: Not on file   Other Topics Concern  . 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   At certain times of the year he works long hours   but then at certain times of the year works a few hours.    No  family history on file.   Review of Systems:  See HPI for pertinent ROS. All other ROS negative.    Physical Exam: Blood pressure 138/82, pulse 65, temperature 97.6 F (36.4 C), temperature source Oral, resp. rate 16, height 5\' 10"  (1.778 m), weight 223 lb (101.2 kg), SpO2 99 %., Body mass index is 32 kg/m. General: WNWD WM. Appears in no acute distress. Neck: Supple. No thyromegaly. No lymphadenopathy. No carotid bruits. Lungs: Clear bilaterally to auscultation without wheezes, rales, or rhonchi. Breathing is unlabored. Heart: RRR with S1 S2. No murmurs, rubs, or gallops. Musculoskeletal:  Strength and tone normal for age. Extremities/Skin: Warm and dry. No edema.  Neuro: Alert and oriented X 3. Moves all extremities spontaneously. Gait is normal. CNII-XII grossly in tact. Psych:  Responds to questions appropriately with a normal affect.     ASSESSMENT AND PLAN:  66 y.o. year old male with   1. Essential hypertension Blood pressure is controlled. Bmet normal. Continue current medicines.  2. Hyperlipidemia, unspecified hyperlipidemia type Discussed that if we were strictly going back guidelines he should be on medication for diabetes and therefore also on medication for lipids with goal LDL down to 70. He is aware of this information but does not want to start cholesterol medication and will continue his diet and exercise to control this for now.  3. Hyperglycemia Discussed that he really should be starting metformin at this point but he wants to hold off on this and continue his diet and exercise. Discussed scheduling follow-up office visit 3 months. He opts to continue to monitor his blood sugar at home and if he sees this increasing we'll schedule follow-up visit sooner but otherwise will wait 6 months for follow-up visit.  I reviewed his last CPE note. All preventive care is up to date other than today I did tell him that the shingrix vaccine is now available and he is going to  contact his insurance company to find out cost of the this and let us know.   Marin Olp [place], [place], [facility] 11/24/2016 7:12 AM

## 2017-03-30 ENCOUNTER — Other Ambulatory Visit: Payer: Self-pay | Admitting: Physician Assistant

## 2017-03-30 DIAGNOSIS — I10 Essential (primary) hypertension: Secondary | ICD-10-CM

## 2017-05-16 ENCOUNTER — Encounter: Payer: Self-pay | Admitting: Physician Assistant

## 2017-05-18 ENCOUNTER — Other Ambulatory Visit: Payer: Self-pay

## 2017-05-18 ENCOUNTER — Encounter: Payer: Self-pay | Admitting: Physician Assistant

## 2017-05-18 ENCOUNTER — Ambulatory Visit (INDEPENDENT_AMBULATORY_CARE_PROVIDER_SITE_OTHER): Payer: Medicare Other | Admitting: Physician Assistant

## 2017-05-18 VITALS — BP 138/88 | HR 80 | Temp 98.1°F | Resp 16 | Wt 220.8 lb

## 2017-05-18 DIAGNOSIS — L918 Other hypertrophic disorders of the skin: Secondary | ICD-10-CM

## 2017-05-19 ENCOUNTER — Encounter: Payer: Self-pay | Admitting: Physician Assistant

## 2017-05-19 NOTE — Progress Notes (Signed)
    Patient ID: Daniel Blackwell MRN: [id number], DOB: [birth year], 66 y.o. Date of Encounter: 05/19/2017, 7:12 AM    Chief Complaint:  Chief Complaint  Patient presents with  . removal of skin tag    on back      HPI: 66 y.o. year old male presents with above.  He has skin tag on his back that is irritated. Wants this removed. No other concerns to address today.     Home Meds:   Outpatient Medications Prior to Visit  Medication Sig Dispense Refill  . aspirin 81 MG tablet Take 81 mg by mouth daily.    . cetirizine (ZYRTEC) 10 MG tablet Take 10 mg by mouth daily. Reported on 06/19/2015    . hydrochlorothiazide (MICROZIDE) 12.5 MG capsule TAKE 1 CAPSULE(12.5 MG) BY MOUTH DAILY 90 capsule 0  . lisinopril (PRINIVIL,ZESTRIL) 20 MG tablet TAKE 1 TABLET(20 MG) BY MOUTH DAILY 90 tablet 0  . Multiple Vitamin (MULTIVITAMIN) tablet Take 1 tablet by mouth daily.    . Omega-3 Fatty Acids (FISH OIL) 1000 MG CPDR Take 2 capsules by mouth daily.    . vitamin B-12 (CYANOCOBALAMIN) 1000 MCG tablet Take 1,000 mcg by mouth daily.      No facility-administered medications prior to visit.     Allergies:  Allergies  Allergen Reactions  . Asa [Aspirin] Swelling  . Other Swelling    CASHEWS  . Penicillins Rash  . Sulfa Antibiotics Rash      Review of Systems: See HPI for pertinent ROS. All other ROS negative.    Physical Exam: Blood pressure 138/88, pulse 80, temperature 98.1 F (36.7 C), temperature source Oral, resp. rate 16, weight 100.2 kg (220 lb 12.8 oz), SpO2 95 %., Body mass index is 31.68 kg/m. General:  WNWD WM. Appears in no acute distress. Neck: Supple. No thyromegaly. No lymphadenopathy. Lungs: Clear bilaterally to auscultation without wheezes, rales, or rhonchi. Breathing is unlabored. Heart: Regular rhythm. No murmurs, rubs, or gallops. Msk:  Strength and tone normal for age. Skin: Upper back at approx T2 level---near midline---there is skin tag--very small base but then  large lesion. Neuro: Alert and oriented X 3. Moves all extremities spontaneously. Gait is normal. CNII-XII grossly in tact. Psych:  Responds to questions appropriately with a normal affect.     ASSESSMENT AND PLAN:  66 y.o. year old male with   Skin Tag Site was cleansed with Betadine and Alcohol.  Topical anaesthetic applied.  Scissor excision.  Drysol applied for bleeding cessation.  Band Aid applied.   Signed, [address] [place], [place], [facility] 05/19/2017 7:12 AM

## 2017-06-13 DIAGNOSIS — M1990 Unspecified osteoarthritis, unspecified site: Secondary | ICD-10-CM

## 2017-06-13 HISTORY — DX: Unspecified osteoarthritis, unspecified site: M19.90

## 2017-07-14 ENCOUNTER — Other Ambulatory Visit: Payer: Self-pay | Admitting: Physician Assistant

## 2017-07-14 DIAGNOSIS — I10 Essential (primary) hypertension: Secondary | ICD-10-CM

## 2017-08-15 ENCOUNTER — Other Ambulatory Visit: Payer: Self-pay | Admitting: Physician Assistant

## 2017-08-15 DIAGNOSIS — I10 Essential (primary) hypertension: Secondary | ICD-10-CM

## 2017-09-26 ENCOUNTER — Other Ambulatory Visit: Payer: Self-pay | Admitting: Physician Assistant

## 2017-09-26 DIAGNOSIS — I10 Essential (primary) hypertension: Secondary | ICD-10-CM

## 2017-10-18 ENCOUNTER — Ambulatory Visit: Payer: Medicare Other | Admitting: Physician Assistant

## 2017-10-18 ENCOUNTER — Encounter: Payer: Self-pay | Admitting: Physician Assistant

## 2017-10-18 VITALS — BP 142/102 | HR 91 | Temp 98.1°F | Resp 16 | Ht 70.0 in | Wt 222.4 lb

## 2017-10-18 DIAGNOSIS — Z0000 Encounter for general adult medical examination without abnormal findings: Secondary | ICD-10-CM | POA: Diagnosis not present

## 2017-10-18 DIAGNOSIS — E785 Hyperlipidemia, unspecified: Secondary | ICD-10-CM | POA: Diagnosis not present

## 2017-10-18 DIAGNOSIS — R739 Hyperglycemia, unspecified: Secondary | ICD-10-CM | POA: Diagnosis not present

## 2017-10-18 DIAGNOSIS — I10 Essential (primary) hypertension: Secondary | ICD-10-CM | POA: Diagnosis not present

## 2017-10-18 MED ORDER — AMLODIPINE BESYLATE 5 MG PO TABS: 5.0000 mg | ORAL_TABLET | Freq: Every day | ORAL | 11 refills | Status: DC

## 2017-10-18 NOTE — Progress Notes (Signed)
Patient ID: Daniel Blackwell MRN: [id number], DOB: March 07, 1951, 67 y.o. Date of Encounter: @DATE @  Chief Complaint:  Chief Complaint  Patient presents with  . follow up bood pressure    HPI: 67 y.o. year old male     11/23/2016:  presents for OV to f/u hypertension, hyperlipidemia, hyperglycemia.  He recently came in had fasting labs and is here to review those findings.  Prior set of labs had been on 01/20/16 and this most recent set was 11/15/16. Reviewed that A1c had been 6.1--8/17 and now 6.5. He states that he had been checking his blood sugar fasting in the morning about once a week and was getting 105 to 1:15. Says if he had eaten pasta the night before it would be more like 124. Says that then his test strips ran out and ended up having to get a new meter and new test strips and since then has been getting readings of about 120s to 130s. Since these more recent readings have been a little higher schedule today's visit. Says that his daughter and granddaughter live with them in a been living with them for about 69 years so they do sometimes have pasta and rice but otherwise he tries to be extremely careful with his carb intake. States that all of his family members were on medication for diabetes well before his current age and most were on insulin by this age.  Also discussed his lipid profile. He does not want to start on any lipid medication. Discussed that overall his lipids are excellent but when someone is diagnosed with diabetes the goal LDL is down to 70 he is aware of this but does not want to start on cholesterol medication. Will continue to keep treat this with diet and exercise.  He does continue to walk for exercise daily.  He states that he has an follow-up eye exam scheduled for July.  He is taking blood pressure medications as directed. Having no lightheadedness or other adverse effects.  Even with his walks and physical exertion he is having no angina symptoms. No  increased amount of shortness of breath last Daniel Blackwell on exertion. No chest pressure heaviness tightness or squeezing.   10/18/2017: Today he reports that he got a "my chart message" stating that he was due for 69-month office visit.  States that "it did not say anything about doing lab work etc. so he is not fasting today".  Says that he just came in for this appointment because he got a message telling him to. He has no specific concerns or complaints to address today. He is taking blood pressure medications as directed.  He is having no adverse effects from these.  States that he has not had his blood pressure checked anywhere since his last visit here.     Past Medical History:  Diagnosis Date  . Allergy    mold/yeast  . Bipolar 1 disorder ([place])   . Colon polyps   . Hyperglycemia   . Hyperlipidemia   . Hypertension      Home Meds: Outpatient Medications Prior to Visit  Medication Sig Dispense Refill  . aspirin 81 MG tablet Take 81 mg by mouth daily.    . cetirizine (ZYRTEC) 10 MG tablet Take 10 mg by mouth daily. Reported on 06/19/2015    . hydrochlorothiazide (MICROZIDE) 12.5 MG capsule TAKE 1 CAPSULE(12.5 MG) BY MOUTH DAILY 30 capsule 0  . lisinopril (PRINIVIL,ZESTRIL) 20 MG tablet TAKE 1 TABLET(20 MG) BY MOUTH DAILY  30 tablet 0  . Multiple Vitamin (MULTIVITAMIN) tablet Take 1 tablet by mouth daily.    . Omega-3 Fatty Acids (FISH OIL) 1000 MG CPDR Take 2 capsules by mouth daily.    . vitamin B-12 (CYANOCOBALAMIN) 1000 MCG tablet Take 1,000 mcg by mouth daily.      No facility-administered medications prior to visit.     Allergies:  Allergies  Allergen Reactions  . Asa [Aspirin] Swelling  . Other Swelling    CASHEWS  . Penicillins Rash  . Sulfa Antibiotics Rash    Social History   Socioeconomic History  . Marital status: Married    Spouse name: Not on file  . Number of children: Not on file  . Years of education: Not on file  . Highest education level: Not on file    Occupational History  . Occupation: Database administrator, Conservation officer, nature    Comment: [employer]  Social Needs  . Financial resource strain: Not on file  . Food insecurity:    Worry: Not on file    Inability: Not on file  . Transportation needs:    Medical: Not on file    Non-medical: Not on file  Tobacco Use  . Smoking status: Former Smoker    Last attempt to quit: 04/04/1983    Years since quitting: 34.5  . Smokeless tobacco: Never Used  Substance and Sexual Activity  . Alcohol use: Yes    Alcohol/week: 0.5 oz    Types: 1 drink(s) per week  . Drug use: No  . Sexual activity: Not on file  Lifestyle  . Physical activity:    Days per week: Not on file    Minutes per session: Not on file  . Stress: Not on file  Relationships  . Social connections:    Talks on phone: Not on file    Gets together: Not on file    Attends religious service: Not on file    Active member of club or organization: Not on file    Attends meetings of clubs or organizations: Not on file    Relationship status: Not on file  . Intimate partner violence:    Fear of current or ex partner: Not on file    Emotionally abused: Not on file    Physically abused: Not on file    Forced sexual activity: Not on file  Other Topics Concern  . 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   At certain times of the year he works long hours   but then at certain times of the year works a few hours.    History reviewed. No pertinent family history.   Review of Systems:  See HPI for pertinent ROS. All other ROS negative.    Physical Exam: Blood pressure (!) 142/102, pulse 91, temperature 98.1 F (36.7 C), temperature source Oral, resp. rate 16, height 5\' 10"  (1.778 m), weight 100.9 kg (222 lb 6.4 oz), SpO2 96 %., Body mass index is 31.91 kg/m. General:  WNWD WM. Appears in no acute distress. Neck: Supple. No  thyromegaly. No lymphadenopathy. No carotid bruits. Lungs: Clear bilaterally to auscultation without wheezes, rales, or rhonchi. Breathing is unlabored. Heart: RRR with S1 S2. No murmurs, rubs, or gallops. Abdomen: Soft, non-tender, non-distended with normoactive bowel sounds. No hepatomegaly. No rebound/guarding. No obvious abdominal masses. Musculoskeletal:  Strength and tone normal  for age. Extremities/Skin: Warm and dry.  No LE edema.  Neuro: Alert and oriented X 3. Moves all extremities spontaneously. Gait is normal. CNII-XII grossly in tact. Psych:  Responds to questions appropriately with a normal affect.     ASSESSMENT AND PLAN:  67 y.o. year old male with   1. Essential hypertension 10/18/2017: Blood pressure is eleved today.  Nurse is getting high reading.  I rechecked blood pressure myself and I am also getting elevated reading.  Getting 140/94 on the left. At this time we will add Norvasc 5 mg daily. As well he will continue his current BP meds including HCTZ 12.5 mg daily and lisinopril 20 mg daily. I reviewed that his last set of labs was performed on 11/15/16. Will have him return after 11/15/2017 fasting for labs.  Will then come in for CPE a couple days later.   He wants to come ahead of time to do his lab work so that we can review those results at his CPE visit.  2. Hyperlipidemia, unspecified hyperlipidemia type 11/23/2016:Discussed that if we were strictly going back guidelines he should be on medication for diabetes and therefore also on medication for lipids with goal LDL down to 70. He is aware of this information but does not want to start cholesterol medication and will continue his diet and exercise to control this for now. 10/18/2017: He will return for fasting labs and follow-up CPE   3. Hyperglycemia Discussed that he really should be starting metformin at this point but he wants to hold off on this and continue his diet and exercise. Discussed scheduling follow-up  office visit 3 months. He opts to continue to monitor his blood sugar at home and if he sees this increasing we'll schedule follow-up visit sooner but otherwise will wait 6 months for follow-up visit. 10/18/2017: He will return for fasting labs and follow-up CPE    Signed, Olean Ree Beclabito, [place], [facility] 10/18/2017 12:58 PM

## 2017-10-19 ENCOUNTER — Ambulatory Visit: Payer: Medicare Other | Admitting: Physician Assistant

## 2017-10-27 ENCOUNTER — Other Ambulatory Visit: Payer: Self-pay | Admitting: Physician Assistant

## 2017-10-27 DIAGNOSIS — I10 Essential (primary) hypertension: Secondary | ICD-10-CM

## 2017-10-30 ENCOUNTER — Other Ambulatory Visit: Payer: Self-pay | Admitting: *Deleted

## 2017-10-30 DIAGNOSIS — I10 Essential (primary) hypertension: Secondary | ICD-10-CM

## 2017-10-30 MED ORDER — HYDROCHLOROTHIAZIDE 12.5 MG PO CAPS
12.5000 mg | ORAL_CAPSULE | Freq: Every day | ORAL | 1 refills | Status: DC
Start: 2017-10-30 — End: 2018-05-21

## 2017-10-30 MED ORDER — LISINOPRIL 20 MG PO TABS: 20.0000 mg | ORAL_TABLET | Freq: Every day | ORAL | 1 refills | Status: DC

## 2017-11-20 ENCOUNTER — Other Ambulatory Visit: Payer: Medicare Other

## 2017-11-20 DIAGNOSIS — E785 Hyperlipidemia, unspecified: Secondary | ICD-10-CM

## 2017-11-20 DIAGNOSIS — Z0000 Encounter for general adult medical examination without abnormal findings: Secondary | ICD-10-CM

## 2017-11-20 DIAGNOSIS — R739 Hyperglycemia, unspecified: Secondary | ICD-10-CM

## 2017-11-20 DIAGNOSIS — I10 Essential (primary) hypertension: Secondary | ICD-10-CM

## 2017-11-21 LAB — CBC WITH DIFFERENTIAL/PLATELET
BASOS ABS: 74 {cells}/uL (ref 0–200)
Basophils Relative: 0.9 %
Eosinophils Absolute: 221 cells/uL (ref 15–500)
Eosinophils Relative: 2.7 %
HEMATOCRIT: 46.5 % (ref 38.5–50.0)
Hemoglobin: 15.9 g/dL (ref 13.2–17.1)
LYMPHS ABS: 2944 {cells}/uL (ref 850–3900)
MCH: 30.1 pg (ref 27.0–33.0)
MCHC: 34.2 g/dL (ref 32.0–36.0)
MCV: 87.9 fL (ref 80.0–100.0)
MPV: 10.5 fL (ref 7.5–12.5)
Monocytes Relative: 7.9 %
NEUTROS PCT: 52.6 %
Neutro Abs: 4313 cells/uL (ref 1500–7800)
PLATELETS: 186 10*3/uL (ref 140–400)
RBC: 5.29 10*6/uL (ref 4.20–5.80)
RDW: 13.1 % (ref 11.0–15.0)
TOTAL LYMPHOCYTE: 35.9 %
WBC: 8.2 10*3/uL (ref 3.8–10.8)
WBCMIX: 648 {cells}/uL (ref 200–950)

## 2017-11-21 LAB — HEMOGLOBIN A1C
HEMOGLOBIN A1C: 6.6 %{Hb} — AB (ref <5.7)
MEAN PLASMA GLUCOSE: 143 (calc)
eAG (mmol/L): 7.9 (calc)

## 2017-11-21 LAB — COMPLETE METABOLIC PANEL WITH GFR
AG RATIO: 1.7 (calc) (ref 1.0–2.5)
ALKALINE PHOSPHATASE (APISO): 67 U/L (ref 40–115)
ALT: 46 U/L (ref 9–46)
AST: 34 U/L (ref 10–35)
Albumin: 4.4 g/dL (ref 3.6–5.1)
BILIRUBIN TOTAL: 0.9 mg/dL (ref 0.2–1.2)
BUN: 15 mg/dL (ref 7–25)
CHLORIDE: 100 mmol/L (ref 98–110)
CO2: 28 mmol/L (ref 20–32)
Calcium: 9.6 mg/dL (ref 8.6–10.3)
Creat: 1.22 mg/dL (ref 0.70–1.25)
GFR, Est African American: 70 mL/min/{1.73_m2} (ref >=60)
GFR, Est Non African American: 61 mL/min/{1.73_m2} (ref >=60)
GLOBULIN: 2.6 g/dL (ref 1.9–3.7)
Glucose, Bld: 130 mg/dL — ABNORMAL HIGH (ref 65–99)
POTASSIUM: 4.5 mmol/L (ref 3.5–5.3)
SODIUM: 138 mmol/L (ref 135–146)
Total Protein: 7 g/dL (ref 6.1–8.1)

## 2017-11-21 LAB — LIPID PANEL
Cholesterol: 195 mg/dL (ref <200)
HDL: 47 mg/dL (ref >40)
LDL Cholesterol (Calc): 111 mg/dL (calc) — ABNORMAL HIGH
NON-HDL CHOLESTEROL (CALC): 148 mg/dL — AB (ref <130)
TRIGLYCERIDES: 260 mg/dL — AB (ref <150)
Total CHOL/HDL Ratio: 4.1 (calc) (ref <5.0)

## 2017-11-21 LAB — TSH: TSH: 2.6 mIU/L (ref 0.40–4.50)

## 2017-11-21 LAB — PSA: PSA: 0.3 ng/mL (ref <=4.0)

## 2017-11-22 ENCOUNTER — Encounter: Payer: Self-pay | Admitting: Physician Assistant

## 2017-11-22 ENCOUNTER — Other Ambulatory Visit: Payer: Self-pay

## 2017-11-22 ENCOUNTER — Ambulatory Visit (INDEPENDENT_AMBULATORY_CARE_PROVIDER_SITE_OTHER): Payer: Medicare Other | Admitting: Physician Assistant

## 2017-11-22 VITALS — BP 128/82 | HR 77 | Temp 98.2°F | Resp 16 | Ht 69.25 in | Wt 218.0 lb

## 2017-11-22 DIAGNOSIS — Z0000 Encounter for general adult medical examination without abnormal findings: Secondary | ICD-10-CM

## 2017-11-22 DIAGNOSIS — K635 Polyp of colon: Secondary | ICD-10-CM

## 2017-11-22 DIAGNOSIS — E785 Hyperlipidemia, unspecified: Secondary | ICD-10-CM | POA: Diagnosis not present

## 2017-11-22 DIAGNOSIS — R739 Hyperglycemia, unspecified: Secondary | ICD-10-CM | POA: Diagnosis not present

## 2017-11-22 DIAGNOSIS — I10 Essential (primary) hypertension: Secondary | ICD-10-CM | POA: Diagnosis not present

## 2017-11-22 NOTE — Progress Notes (Signed)
Patient ID: [name] MRN: [id number], DOB: Aug 03, 1950 67 y.o. Date of Encounter: 11/22/2017, 9:49 AM    Chief Complaint: Physical (CPE)  HPI: 67 y.o. y/o male here for CPE.   Has no specific concerns to address today. He was just recently here for his last routine visit 10/18/2017 for management of hypertension hyperlipidemia and hyperglycemia.  See that office note regarding that information.  Today he reports that they are going to [place] for vacation.  They have gone there before.  Says that it feels like springtime they are year-round.   Review of Systems: Consitutional: No fever, chills, fatigue, night sweats, lymphadenopathy, or weight changes. Eyes: No visual changes, eye redness, or discharge. ENT/Mouth: Ears: No otalgia, tinnitus, hearing loss, discharge. Nose: No congestion, rhinorrhea, sinus pain, or epistaxis. Throat: No sore throat, post nasal drip, or teeth pain. Cardiovascular: No CP, palpitations, diaphoresis, DOE, edema, orthopnea, PND. Respiratory: No cough, hemoptysis, SOB, or wheezing. Gastrointestinal: No anorexia, dysphagia, reflux, pain, nausea, vomiting, hematemesis, diarrhea, constipation, BRBPR, or melena. Genitourinary: No dysuria, frequency, urgency, hematuria, incontinence, nocturia, decreased urinary stream, discharge, impotence, or testicular pain/masses. Musculoskeletal: No decreased ROM, myalgias, stiffness, joint swelling, or weakness. Skin: No rash, erythema, lesion changes, pain, warmth, jaundice, or pruritis. Neurological: No headache, dizziness, syncope, seizures, tremors, memory loss, coordination problems, or paresthesias. Psychological: No anxiety, depression, hallucinations, SI/HI. Endocrine: No fatigue, polydipsia, polyphagia, polyuria, or known diabetes. All other systems were reviewed and are otherwise negative.  Past Medical History:  Diagnosis Date  . Allergy    mold/yeast  . Bipolar 1 disorder ([place])   . Colon polyps   .  Hyperglycemia   . Hyperlipidemia   . Hypertension      Past Surgical History:  Procedure Laterality Date  . EYE SURGERY     laser eye surgery mar 2018, and april 2018    Home Meds:  Outpatient Medications Prior to Visit  Medication Sig Dispense Refill  . amLODipine (NORVASC) 5 MG tablet Take 1 tablet (5 mg total) by mouth daily. 30 tablet 11  . aspirin 81 MG tablet Take 81 mg by mouth daily.    . cetirizine (ZYRTEC) 10 MG tablet Take 10 mg by mouth daily. Reported on 06/19/2015    . hydrochlorothiazide (MICROZIDE) 12.5 MG capsule Take 1 capsule (12.5 mg total) by mouth daily. 90 capsule 1  . lisinopril (PRINIVIL,ZESTRIL) 20 MG tablet Take 1 tablet (20 mg total) by mouth daily. 90 tablet 1  . Multiple Vitamin (MULTIVITAMIN) tablet Take 1 tablet by mouth daily.    . Omega-3 Fatty Acids (FISH OIL) 1000 MG CPDR Take 2 capsules by mouth daily.    . phenylephrine (SUDAFED PE CONGESTION) 10 MG TABS tablet Take 10 mg by mouth every 4 (four) hours as needed.    . vitamin B-12 (CYANOCOBALAMIN) 1000 MCG tablet Take 1,000 mcg by mouth daily.      No facility-administered medications prior to visit.     Allergies:  Allergies  Allergen Reactions  . Asa [Aspirin] Swelling  . Other Swelling    CASHEWS  . Penicillins Rash  . Sulfa Antibiotics Rash    Social History   Socioeconomic History  . Marital status: Married    Spouse name: Not on file  . Number of children: Not on file  . Years of education: Not on file  . Highest education level: Not on file  Occupational History  . Occupation: Database administrator, Conservation officer, nature    Comment: [employer]  Social Needs  .  Financial resource strain: Not on file  . Food insecurity:    Worry: Not on file    Inability: Not on file  . Transportation needs:    Medical: Not on file    Non-medical: Not on file  Tobacco Use  . Smoking status: Former Smoker    Last attempt to quit: 04/04/1983    Years since quitting: 34.6  . Smokeless tobacco: Never Used    Substance and Sexual Activity  . Alcohol use: Yes    Alcohol/week: 0.5 oz    Types: 1 drink(s) per week  . Drug use: No  . Sexual activity: Not on file  Lifestyle  . Physical activity:    Days per week: Not on file    Minutes per session: Not on file  . Stress: Not on file  Relationships  . Social connections:    Talks on phone: Not on file    Gets together: Not on file    Attends religious service: Not on file    Active member of club or organization: Not on file    Attends meetings of clubs or organizations: Not on file    Relationship status: Not on file  . Intimate partner violence:    Fear of current or ex partner: Not on file    Emotionally abused: Not on file    Physically abused: Not on file    Forced sexual activity: Not on file  Other Topics Concern  . 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   At certain times of the year he works long hours   but then at certain times of the year works a few hours.    No family history on file.  Physical Exam: Blood pressure 128/82, pulse 77, temperature 98.2 F (36.8 C), temperature source Oral, resp. rate 16, height 5' 9.25" (1.759 m), weight 98.9 kg (218 lb), SpO2 96 %.  General: Well developed, well nourished WM. Appears in no acute distress. HEENT: Normocephalic, atraumatic. Conjunctiva pink, sclera non-icteric. Pupils 2 mm constricting to 1 mm, round, regular, and equally reactive to light and accomodation. EOMI. Internal auditory canal clear. TMs with good cone of light and without pathology. Nasal mucosa pink. Nares are without discharge. No sinus tenderness. Oral mucosa pink.  Neck: Supple. Trachea midline. No thyromegaly. Full ROM. No lymphadenopathy. Lungs: Clear to auscultation bilaterally without wheezes, rales, or rhonchi. Breathing is of normal effort and unlabored. Cardiovascular: RRR with S1  S2. No murmurs, rubs, or gallops. Distal pulses 2+ symmetrically. No carotid or abdominal bruits. Abdomen: Soft, non-tender, non-distended with normoactive bowel sounds. No hepatosplenomegaly or masses. No rebound/guarding. No CVA tenderness. No hernias. Musculoskeletal: Full range of motion and 5/5 strength throughout.  Skin: Warm and moist without erythema, ecchymosis, wounds, or rash. Neuro: A+Ox3. CN II-XII grossly intact. Moves all extremities spontaneously. Full sensation throughout. Normal gait.  Psych:  Responds to questions appropriately with a normal affect.   Assessment/Plan:  67 y.o. y/o  male here for CPE   1. Medicare annual wellness visit, subsequent  2. Encounter for preventive health examination   A. Screening Labs: He had screening labs performed 11/20/2017. We discussed those results today. A1c 6.6-----he still does not want to start medication for this.  We will continue strict diet and exercise to keep this controlled.  Says that all of his family members  died and had problems with her kidney and liver and he just does not want to start medications unless absolutely necessary. CBC  Normal. CMP ET normal other than glucose 130 Lipid panel shows LDL 111.----See his office note from 10/18/2017.  We have had lengthy discussion about this in the past and again today.  Again still does not want to start medications unless absolutely necessary.  Wants to continue strict diet and exercise to keep this controlled and only use medicine if this becomes extremely uncontrolled. TSH normal. PSA normal.  B. Screening For Prostate Cancer: PSA normal.  C. Screening For Colorectal Cancer:  He states that he has had his colonoscopies performed at [facility] digestive specialist.  States that he has to have colonoscopy every 5 years.  He is not certain of the date of his last colonoscopy or when the next one is due.  He is going to go home and check his records and follow-up  accordingly.  D. Immunizations: Flu---------------------------N/A Tetanus------------------ he received Td 12/09/2011 Pneumococcal--------- received Prevnar 1312/30/2015.  Pneumovax 23 01/27/2016. Zostavax/Shingrix---------- we discussed this today.  He is going to check with his insurance regarding coverage and cost and then if he wants to agreeable to have this will receive this at the pharmacy.     3. Essential hypertension BP is now at goal and controlled.  Continue current medications same.  4. Polyp of colon, unspecified part of colon, unspecified type He states that he has had his colonoscopies performed at [facility] digestive specialist.  States that he has to have colonoscopy every 5 years.  He is not certain of the date of his last colonoscopy or when the next one is due.  He is going to go home and check his records and follow-up accordingly.    5. Hyperlipidemia, unspecified hyperlipidemia type Lipid panel shows LDL 111.----See his office note from 10/18/2017.  We have had lengthy discussion about this in the past and again today.  Again still does not want to start medications unless absolutely necessary.  Wants to continue strict diet and exercise to keep this controlled and only use medicine if this becomes extremely uncontrolled.   6. Hyperglycemia A1c 6.6-----he still does not want to start medication for this.  We will continue strict diet and exercise to keep this controlled.  Says that all of his family members died and had problems with her kidney and liver and he just does not want to start medications unless absolutely necessary.   Subjective:   Patient presents for Medicare Annual/Subsequent preventive examination.   Review Past Medical/Family/Social: This information is all reviewed today.  Risk Factors  Current exercise habits: He is compliant with exercise. Dietary issues discussed: He is extremely compliant with diet and eats very low carbohydrate diet as  well as low-sodium low-cholesterol diet.  Cardiac risk factors: Diabetes, hypertension, hyperlipidemia, male, age  Depression Screen  (Note: if answer to either of the following is "Yes", a more complete depression screening is indicated)  Over the past two weeks, have you felt down, depressed or hopeless? No Over the past two weeks, have you felt little interest or pleasure in doing things? No Have you lost interest or pleasure in daily life? No Do you often feel hopeless? No Do you cry easily over simple problems? No   Activities of Daily Living  In your present state of health, do you have any difficulty performing the following activities?:  Driving? No  Managing money? No  Feeding yourself? No  Getting  from bed to chair? No  Climbing a flight of stairs? No  Preparing food and eating?: No  Bathing or showering? No  Getting dressed: No  Getting to the toilet? No  Using the toilet:No  Moving around from place to place: No  In the past year have you fallen or had a near fall?:No  Are you sexually active? No  Do you have more than one partner? No   Hearing Difficulties: No  Do you often ask people to speak up or repeat themselves? No  Do you experience ringing or noises in your ears? No Do you have difficulty understanding soft or whispered voices? No  Do you feel that you have a problem with memory? No Do you often misplace items? No  Do you feel safe at home? Yes  Cognitive Testing  Alert? Yes Normal Appearance?Yes  Oriented to person? Yes Place? Yes  Time? Yes  Recall of three objects? Yes  Can perform simple calculations? Yes  Displays appropriate judgment?Yes  Can read the correct time from a watch face?Yes   List the Names of Other Physician/Practitioners you currently use:  [facility] digestive specialist Dr. [place] Crane dermatology  Indicate any recent Medical Services you may have received from other than [facility] providers in the past year (date may be approximate).   [facility] specialist Dr. [place] Crane dermatology  Screening Tests / Date-------- see above.  All of this information is documented above. Colonoscopy                     Zostavax  Influenza Vaccine  Tetanus/tdap    Assessment:    Annual wellness medicare exam   Plan:    During the course of the visit the patient was educated and counseled about appropriate screening and preventive services including:   Colorectal cancer screening  Shingles vaccine.  Medicare Attestation  I have personally reviewed:  The patient's medical and social history  Their use of alcohol, tobacco or illicit drugs  Their current medications and supplements  The patient's functional ability including ADLs,fall risks, home safety risks, cognitive, and hearing and visual impairment  Diet and physical activities  Evidence for depression or mood disorders  The patient's weight, height, BMI have been recorded in the chart. I have made referrals, counseling, and provided education to the patient based on review of the above and I have provided the patient with a written personalized care plan for preventive services.       Signed:   [address] [place], [place]  11/22/2017 9:49 AM

## 2017-11-29 ENCOUNTER — Other Ambulatory Visit: Payer: Self-pay | Admitting: Physician Assistant

## 2017-11-29 DIAGNOSIS — I10 Essential (primary) hypertension: Secondary | ICD-10-CM

## 2017-12-02 ENCOUNTER — Encounter: Payer: Self-pay | Admitting: Physician Assistant

## 2017-12-04 NOTE — Telephone Encounter (Signed)
Reviewed his chart. At office visit 10/18/2017 BP was elevated at 140/102.  Added Norvasc 5 mg. When he returned for CPE on 11/22/2017-- BP was good at 128/82.  Discuss with patient and find out if he has been doing a lot of strenuous yard work----if the only times he is having symptoms is with strenuous yard work in the heat --- then would recommend that he increase fluid intake and monitor -- but continue current meds. However, if he feels that the yard work was not very strenuous--then he can stop the Norvasc and monitor BP off of that medication but continue the other BP meds. Follow-up if BP is greater than 135/85.

## 2017-12-20 ENCOUNTER — Other Ambulatory Visit: Payer: Self-pay | Admitting: Physician Assistant

## 2017-12-20 DIAGNOSIS — I10 Essential (primary) hypertension: Secondary | ICD-10-CM

## 2018-05-21 ENCOUNTER — Other Ambulatory Visit: Payer: Self-pay | Admitting: *Deleted

## 2018-05-21 DIAGNOSIS — I10 Essential (primary) hypertension: Secondary | ICD-10-CM

## 2018-05-21 MED ORDER — HYDROCHLOROTHIAZIDE 12.5 MG PO CAPS: 12.5000 mg | ORAL_CAPSULE | Freq: Every day | ORAL | 1 refills | Status: DC

## 2018-05-22 ENCOUNTER — Other Ambulatory Visit: Payer: Self-pay | Admitting: *Deleted

## 2018-05-22 DIAGNOSIS — I10 Essential (primary) hypertension: Secondary | ICD-10-CM

## 2018-05-22 MED ORDER — LISINOPRIL 20 MG PO TABS: 20.0000 mg | ORAL_TABLET | Freq: Every day | ORAL | 1 refills | Status: DC

## 2018-06-01 ENCOUNTER — Encounter (HOSPITAL_COMMUNITY): Payer: Self-pay | Admitting: Emergency Medicine

## 2018-06-01 ENCOUNTER — Ambulatory Visit (INDEPENDENT_AMBULATORY_CARE_PROVIDER_SITE_OTHER): Payer: Medicare Other

## 2018-06-01 ENCOUNTER — Ambulatory Visit (HOSPITAL_COMMUNITY)
Admission: EM | Admit: 2018-06-01 | Discharge: 2018-06-01 | Disposition: A | Discharge status: Home / Self Care | Payer: Medicare Other | Attending: Internal Medicine | Admitting: Internal Medicine

## 2018-06-01 DIAGNOSIS — M25561 Pain in right knee: Secondary | ICD-10-CM

## 2018-06-01 MED ORDER — INDOMETHACIN 50 MG PO CAPS: 50.0000 mg | ORAL_CAPSULE | Freq: Three times a day (TID) | ORAL | 0 refills | Status: DC

## 2018-06-01 MED ORDER — DOXYCYCLINE HYCLATE 100 MG PO CAPS: 100.0000 mg | ORAL_CAPSULE | Freq: Two times a day (BID) | ORAL | 0 refills | Status: DC

## 2018-06-01 NOTE — ED Triage Notes (Signed)
Pt c/o constant right knee pain onset 3 days  Denies inj/trauma to knee.... sts it's tender to the touch, pain increases w/activity, and has noticed warmth to the area  Slow gait... ambulatory.... has been applying ice w/no relief.  Has also been taking Aleve w/temp relief.    A&O x4... NAD.

## 2018-06-01 NOTE — ED Provider Notes (Signed)
[facility]    CSN: [id number] Arrival date & time: 06/01/18  0813     History   Chief Complaint Chief Complaint  Patient presents with  . Knee Pain    HPI Daniel Blackwell is a 67 y.o. male.   67 year old male comes in for 3 day history of right knee pain. He denies injury/trauma. Woke up with knee stiffness and pain. States when nonweight bearing, can be throbbing/dull pain. Pain significantly increases with bearing weight. Pain to the anterior knee around the patella, denies radiation of pain. Has noticed area warm and erythematous. Denies fever, chills, night sweats. Able to flex and extend knee, though with decrease in flexion. Has been applying ice and resting with minimal relief. Aleve with temporary relief. Denies history of gout.     Past Medical History:  Diagnosis Date  . Allergy    mold/yeast  . Bipolar 1 disorder ([place])   . Colon polyps   . Hyperglycemia   . Hyperlipidemia   . Hypertension     Patient Active Problem List   Diagnosis Date Noted  . Hyperlipidemia   . Hyperglycemia   . Multiple allergies 12/01/2010  . Bipolar 1 disorder ([place]) 12/01/2010  . Hypertension 12/01/2010  . Colon polyps 12/01/2010    Past Surgical History:  Procedure Laterality Date  . EYE SURGERY     laser eye surgery mar 2018, and april 2018       Home Medications    Prior to Admission medications   Medication Sig Start Date End Date Taking? Authorizing Provider  amLODipine (NORVASC) 5 MG tablet Take 1 tablet (5 mg total) by mouth daily. 10/18/17 10/18/18 Yes Orlena Sheldon, PA-C  aspirin 81 MG tablet Take 81 mg by mouth daily.   Yes [provider]  cetirizine (ZYRTEC) 10 MG tablet Take 10 mg by mouth daily. Reported on 06/19/2015   Yes [provider]  hydrochlorothiazide (MICROZIDE) 12.5 MG capsule Take 1 capsule (12.5 mg total) by mouth daily. 05/21/18  Yes Kirkwood, Modena Nunnery, MD  lisinopril (PRINIVIL,ZESTRIL) 20 MG tablet Take 1 tablet (20 mg  total) by mouth daily. 05/22/18  Yes [place], Modena Nunnery, MD  Multiple Vitamin (MULTIVITAMIN) tablet Take 1 tablet by mouth daily.   Yes [provider]  Omega-3 Fatty Acids (FISH OIL) 1000 MG CPDR Take 2 capsules by mouth daily.   Yes [provider]  phenylephrine (SUDAFED PE CONGESTION) 10 MG TABS tablet Take 10 mg by mouth every 4 (four) hours as needed.   Yes [provider]  vitamin B-12 (CYANOCOBALAMIN) 1000 MCG tablet Take 1,000 mcg by mouth daily.    Yes [provider]  doxycycline (VIBRAMYCIN) 100 MG capsule Take 1 capsule (100 mg total) by mouth 2 (two) times daily. 06/01/18   Tasia Catchings, Koleen Celia V, PA-C  indomethacin (INDOCIN) 50 MG capsule Take 1 capsule (50 mg total) by mouth 3 (three) times daily with meals. 06/01/18   Ok Edwards, PA-C    Family History History reviewed. No pertinent family history.  Social History Social History   Tobacco Use  . Smoking status: Former Smoker    Last attempt to quit: 04/04/1983    Years since quitting: 35.1  . Smokeless tobacco: Never Used  Substance Use Topics  . Alcohol use: Yes    Alcohol/week: 1.0 standard drinks    Types: 1 drink(s) per week  . Drug use: No     Allergies   Asa [aspirin]; Other; Penicillins; and  Sulfa antibiotics   Review of Systems Review of Systems  Reason unable to perform ROS: See HPI as above.     Physical Exam Triage Vital Signs ED Triage Vitals  Enc Vitals Group     BP 06/01/18 0849 (!) 148/87     Pulse Rate 06/01/18 0849 82     Resp 06/01/18 0849 16     Temp 06/01/18 0849 98.6 F (37 C)     Temp Source 06/01/18 0849 Oral     SpO2 06/01/18 0849 99 %     Weight --      Height --      Head Circumference --      Peak Flow --      Pain Score 06/01/18 0851 8     Pain Loc --      Pain Edu? --      Excl. in [place]? --    No data found.  Updated Vital Signs BP (!) 148/87 (BP Location: Right Arm)   Pulse 82   Temp 98.6 F (37 C) (Oral)   Resp 16   SpO2 99%    Physical Exam Constitutional:      General: He is not in acute distress.    Appearance: He is well-developed. He is not diaphoretic.  HENT:     Head: Normocephalic and atraumatic.  Eyes:     Conjunctiva/sclera: Conjunctivae normal.     Pupils: Pupils are equal, round, and reactive to light.  Musculoskeletal:     Comments: Mild swelling of the right knee.  Erythema to the anterior knee, around the patella.  Mild warmth.  Tenderness to palpation of the patella, no tenderness to the joint line.  No tenderness to palpation of posterior knee. Decreased flexion, able to extend fully. Strength normal and equal bilaterally. Sensation intact and equal bilaterally.   Neurological:     Mental Status: He is alert and oriented to person, place, and time.     UC Treatments / Results  Labs (all labs ordered are listed, but only abnormal results are displayed) Labs Reviewed - No data to display  EKG None  Radiology Dg Knee Complete 4 Views Right  Result Date: 06/01/2018 CLINICAL DATA:  Right knee pain, no known injury, initial encounter EXAM: RIGHT KNEE - COMPLETE 4+ VIEW COMPARISON:  None. FINDINGS: Very minimal medial joint space narrowing is noted. No acute fracture or dislocation is seen. Mild patellofemoral degenerative changes are noted as well. No joint effusion is seen. IMPRESSION: Mild degenerative change without acute abnormality. Electronically Signed   By: Inez Catalina M.D.   On: 06/01/2018 10:06    Procedures Procedures (including critical care time)  Medications Ordered in UC Medications - No data to display  Initial Impression / Assessment and Plan / UC Course  I have reviewed the triage vital signs and the nursing notes.  Pertinent labs & imaging results that were available during my care of the patient were reviewed by me and considered in my medical decision making (see chart for details).    Xray with mild degenerative change without acute abnormality. Discussed case  with Dr Clayton Lefort. Will cover for both infection as well as possible prepatellar bursitis/gout. Will start doxycycline as directed. Indomethacin as directed. Return precautions given. Patient expresses understanding and agrees to plan.   Final Clinical Impressions(s) / UC Diagnoses   Final diagnoses:  Acute pain of right knee   ED Prescriptions    Medication Sig Dispense Auth. Provider   doxycycline (VIBRAMYCIN) 100  MG capsule Take 1 capsule (100 mg total) by mouth 2 (two) times daily. 20 capsule Ceasia Elwell V, PA-C   indomethacin (INDOCIN) 50 MG capsule Take 1 capsule (50 mg total) by mouth 3 (three) times daily with meals. 21 capsule Tobin Chad, [place] 06/01/18 1759

## 2018-06-01 NOTE — Discharge Instructions (Addendum)
Xray without acute changes. Does show some arthritis. Start doxycycline to cover for infection. Start indomethacin to cover for inflammation/gout. Continue to rest knee. If experiencing spreading redness, increased warmth, unable to bend or extend knee, fever, go to the emergency department for further evaluation needed.

## 2018-06-28 ENCOUNTER — Ambulatory Visit: Payer: Medicare Other | Admitting: Podiatry

## 2018-06-28 ENCOUNTER — Other Ambulatory Visit: Payer: Self-pay | Admitting: Podiatry

## 2018-06-28 ENCOUNTER — Encounter: Payer: Self-pay | Admitting: Podiatry

## 2018-06-28 ENCOUNTER — Ambulatory Visit (INDEPENDENT_AMBULATORY_CARE_PROVIDER_SITE_OTHER): Payer: Medicare Other

## 2018-06-28 VITALS — BP 161/101 | HR 71 | Resp 16

## 2018-06-28 DIAGNOSIS — M1000 Idiopathic gout, unspecified site: Secondary | ICD-10-CM | POA: Diagnosis not present

## 2018-06-28 DIAGNOSIS — M79674 Pain in right toe(s): Secondary | ICD-10-CM

## 2018-06-28 DIAGNOSIS — M779 Enthesopathy, unspecified: Secondary | ICD-10-CM

## 2018-06-28 DIAGNOSIS — M79675 Pain in left toe(s): Secondary | ICD-10-CM

## 2018-06-28 DIAGNOSIS — M7751 Other enthesopathy of right foot: Secondary | ICD-10-CM | POA: Diagnosis not present

## 2018-06-28 DIAGNOSIS — M7752 Other enthesopathy of left foot: Secondary | ICD-10-CM | POA: Diagnosis not present

## 2018-06-28 MED ORDER — TRIAMCINOLONE ACETONIDE 10 MG/ML IJ SUSP
10.0000 mg | Freq: Once | INTRAMUSCULAR | Status: AC
  Administered 2018-06-28: 10 mg

## 2018-06-28 NOTE — Patient Instructions (Signed)

## 2018-06-28 NOTE — Progress Notes (Signed)
   Subjective:    Patient ID: Daniel Blackwell, male    DOB: Apr 01, 1951, 68 y.o.   MRN: [id number]  HPI    Review of Systems  Eyes: Positive for discharge.  All other systems reviewed and are negative.      Objective:   Physical Exam        Assessment & Plan:

## 2018-06-29 LAB — ANA, IFA COMPREHENSIVE PANEL
ANA: NEGATIVE
ENA SM Ab Ser-aCnc: <1 AI
SM/RNP: <1 AI
SSA (Ro) (ENA) Antibody, IgG: <1 AI
SSB (La) (ENA) Antibody, IgG: <1 AI
Scleroderma (Scl-70) (ENA) Antibody, IgG: <1 AI
ds DNA Ab: <1 IU/mL

## 2018-06-29 LAB — URIC ACID: Uric Acid, Serum: 10 mg/dL — ABNORMAL HIGH (ref 4.0–8.0)

## 2018-06-29 LAB — RHEUMATOID FACTOR: Rheumatoid fact SerPl-aCnc: <14 IU/mL (ref <14)

## 2018-06-29 LAB — HLA-B27 ANTIGEN: HLA-B27 Antigen: NEGATIVE

## 2018-06-29 LAB — SEDIMENTATION RATE: Sed Rate: 6 mm/h (ref 0–20)

## 2018-06-29 LAB — C-REACTIVE PROTEIN: CRP: 3 mg/L (ref <8.0)

## 2018-07-04 NOTE — Progress Notes (Signed)
Subjective:   Patient ID: Daniel Blackwell, male   DOB: 68 y.o.   MRN: [id number]   HPI Patient presents stating I had a lot of swelling fourth toe left foot slightly right fourth toe and its been sore and makes it hard for me to wear shoe and I do think I had a history of gout.  Patient does not smoke currently and likes to be active   Review of Systems  All other systems reviewed and are negative.       Objective:  Physical Exam Vitals signs and nursing note reviewed.  Constitutional:      Appearance: He is well-developed.  Pulmonary:     Effort: Pulmonary effort is normal.  Musculoskeletal: Normal range of motion.  Skin:    General: Skin is warm.  Neurological:     Mental Status: He is alert.     Neurovascular status intact muscle strength was adequate range of motion within normal limits with patient found to have a swollen fourth digit left at the inner phalangeal joint localized is been present for a number of months with no history of trauma.  The right is also slightly sore but not to the same degree and patient was found to have good digital perfusion well oriented x3     Assessment:  Possibility for inflammatory condition of a systemic nature versus localized condition     Plan:  H&P x-ray reviewed and today I did a proximal nerve block and under sterile conditions I did do a careful inner phalangeal joint 2 mg dexamethasone 2 mg Kenalog 3 mg Xylocaine and applied compression dressing to take pressure off the toe.  Patient will be seen back to recheck as symptoms indicate and may require blood work or more advanced treatments depending on response  X-ray indicates that there is some changes around the inner phalangeal joint but it appears to be localized and may be due to arthritic process

## 2018-07-05 ENCOUNTER — Ambulatory Visit: Payer: Medicare Other | Admitting: Podiatry

## 2018-07-05 ENCOUNTER — Encounter: Payer: Self-pay | Admitting: Podiatry

## 2018-07-05 DIAGNOSIS — M79674 Pain in right toe(s): Secondary | ICD-10-CM | POA: Diagnosis not present

## 2018-07-05 DIAGNOSIS — M779 Enthesopathy, unspecified: Secondary | ICD-10-CM

## 2018-07-05 DIAGNOSIS — M1000 Idiopathic gout, unspecified site: Secondary | ICD-10-CM

## 2018-07-05 DIAGNOSIS — M79675 Pain in left toe(s): Secondary | ICD-10-CM

## 2018-07-05 MED ORDER — ALLOPURINOL 100 MG PO TABS: 100.0000 mg | ORAL_TABLET | Freq: Every day | ORAL | 6 refills | Status: DC

## 2018-07-05 NOTE — Progress Notes (Signed)
Subjective:   Patient ID: Daniel Blackwell, male   DOB: 68 y.o.   MRN: [id number]   HPI Patient presents stating the fourth toe is still swollen but it is not painful and he wants to review his blood work   Review of Systems  All other systems reviewed and are negative.       Objective:  Physical Exam Vitals signs and nursing note reviewed.  Constitutional:      Appearance: He is well-developed.  Pulmonary:     Effort: Pulmonary effort is normal.  Musculoskeletal: Normal range of motion.  Skin:    General: Skin is warm.  Neurological:     Mental Status: He is alert.     Neurovascular status was found to be intact with patient found to have edematous fourth digit left and mild fourth digit right localized in nature with no other pathology and history of acute knee pain that occurred several months ago     Assessment:  Acute gout attack that is most likely creating the inflammatory condition he is experiencing     Plan:  H&P condition reviewed explained to patient.  I educated him on gout and working to start him on allopurinol at the current time and I discussed long-term possible reduction of his high blood pressure medicine as it may be contributing to his elevated uric acid.  I reviewed his blood work currently and patient will be seen back again in 4 months and we will redo his blood work and work with his family physician on continuing to reduce this even though he may still deal with swelling of his digits

## 2018-09-15 ENCOUNTER — Encounter: Payer: Self-pay | Admitting: Family Medicine

## 2018-09-17 ENCOUNTER — Other Ambulatory Visit: Payer: Self-pay | Admitting: Family Medicine

## 2018-09-17 MED ORDER — INDOMETHACIN 50 MG PO CAPS: 50.0000 mg | ORAL_CAPSULE | Freq: Three times a day (TID) | ORAL | 0 refills | Status: DC

## 2018-11-01 ENCOUNTER — Encounter: Payer: Self-pay | Admitting: Family Medicine

## 2018-11-06 ENCOUNTER — Encounter: Payer: Self-pay | Admitting: Family Medicine

## 2018-11-06 ENCOUNTER — Other Ambulatory Visit: Payer: Self-pay

## 2018-11-06 ENCOUNTER — Ambulatory Visit (INDEPENDENT_AMBULATORY_CARE_PROVIDER_SITE_OTHER): Payer: Medicare Other | Admitting: Family Medicine

## 2018-11-06 VITALS — BP 144/90 | HR 88 | Temp 98.7°F | Resp 18 | Ht 69.0 in | Wt 212.0 lb

## 2018-11-06 DIAGNOSIS — I10 Essential (primary) hypertension: Secondary | ICD-10-CM

## 2018-11-06 DIAGNOSIS — M1000 Idiopathic gout, unspecified site: Secondary | ICD-10-CM

## 2018-11-06 MED ORDER — ALLOPURINOL 300 MG PO TABS: 300.0000 mg | ORAL_TABLET | Freq: Every day | ORAL | 6 refills | Status: DC

## 2018-11-06 MED ORDER — COLCHICINE 0.6 MG PO TABS: 0.6000 mg | ORAL_TABLET | Freq: Every day | ORAL | 2 refills | Status: DC

## 2018-11-06 NOTE — Progress Notes (Signed)
Subjective:    Patient ID: Daniel Blackwell, male    DOB: [birth year], 67 y.o.   MRN: [id number]  HPI  Patient is here today to discuss polyarthralgias.  Of note, the patient has mild chronic kidney disease.  He also has essential hypertension treated with lisinopril 20 mg a day, amlodipine 5 mg a day, and hydrochlorothiazide 12.5 mg a day.  Over the last several months he has had migratory arthralgias.  Last summer he developed pain and swelling in his fourth left PIP joint on his foot.  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  He went and saw a podiatrist who did x-rays that did show erosive damage to the PIP joint.  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  His uric acid level was checked in January and found to be 10.  He is here today to discuss what options he has to treat the pain.  He was started on allopurinol 10 mg a day in January by the podiatrist however he has not followed back up with the podiatrist and he continues to have occasional joint pains that suddenly occur that are severe. Past Medical History:  Diagnosis Date  . Allergy    mold/yeast  . Bipolar 1 disorder ([place])   . Colon polyps   . Hyperglycemia   . Hyperlipidemia   . Hypertension    Past Surgical History:  Procedure Laterality Date  . EYE SURGERY     laser eye surgery mar 2018, and april 2018   Current Outpatient Medications on File Prior to Visit  Medication Sig Dispense Refill  . allopurinol (ZYLOPRIM) 100 MG tablet Take 1 tablet (100 mg total) by mouth daily. 30 tablet 6  . aspirin 81 MG tablet Take 81 mg by mouth daily.    . cetirizine (ZYRTEC) 10 MG tablet Take 10 mg by mouth daily. Reported on 06/19/2015    . hydrochlorothiazide (MICROZIDE) 12.5 MG capsule Take 1 capsule (12.5 mg total) by mouth daily. 90 capsule 1  . indomethacin (INDOCIN) 50 MG capsule Take 1 capsule (50  mg total) by mouth 3 (three) times daily with meals. 21 capsule 0  . lisinopril (PRINIVIL,ZESTRIL) 20 MG tablet Take 1 tablet (20 mg total) by mouth daily. 90 tablet 1  . Multiple Vitamin (MULTIVITAMIN) tablet Take 1 tablet by mouth daily.    . Omega-3 Fatty Acids (FISH OIL) 1000 MG CPDR Take 2 capsules by mouth daily.    . phenylephrine (SUDAFED PE CONGESTION) 10 MG TABS tablet Take 10 mg by mouth every 4 (four) hours as needed.    . vitamin B-12 (CYANOCOBALAMIN) 1000 MCG tablet Take 1,000 mcg by mouth daily.     Marland Kitchen amLODipine (NORVASC) 5 MG tablet Take 1 tablet (5 mg total) by mouth daily. 30 tablet 11   No current facility-administered medications on file prior to visit.    Allergies  Allergen Reactions  . Asa [Aspirin] Swelling  . Other Swelling    CASHEWS  . Penicillins Rash  . Sulfa Antibiotics Rash   Social History   Socioeconomic History  . Marital status: Married    Spouse name: Not on file  . Number of children: Not on file  . Years of education: Not on file  . Highest education level: Not on file  Occupational History  . Occupation: Database administrator, Conservation officer, nature    Comment: [employer]  Social Needs  . Financial resource strain: Not on file  . Food insecurity:    Worry: Not on file    Inability: Not on file  . Transportation needs:    Medical: Not on file    Non-medical: Not on file  Tobacco Use  . Smoking status: Former Smoker    Last attempt to quit: 04/04/1983    Years since quitting: 35.6  . Smokeless tobacco: Never Used  Substance and Sexual Activity  . Alcohol use: Yes    Alcohol/week: 1.0 standard drinks    Types: 1 drink(s) per week  . Drug use: No  . Sexual activity: Not on file  Lifestyle  . Physical activity:    Days per week: Not on file    Minutes per session: Not on file  . Stress: Not on file  Relationships  . Social connections:    Talks on phone: Not on file    Gets together: Not on file    Attends religious service: Not on file    Active  member of club or organization: Not on file    Attends meetings of clubs or organizations: Not on file    Relationship status: Not on file  . Intimate partner violence:    Fear of current or ex partner: Not on file    Emotionally abused: Not on file    Physically abused: Not on file    Forced sexual activity: Not on file  Other Topics Concern  . 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   At certain times of the year he works long hours   but then at certain times of the year works a few hours.     Review of Systems  All other systems reviewed and are negative.      Objective:   Physical Exam Vitals signs reviewed.  Constitutional:      Appearance: Normal appearance.  Cardiovascular:     Rate and Rhythm: Normal rate and regular rhythm.  Pulmonary:     Effort: Pulmonary effort is normal.     Breath sounds: Normal breath sounds.  Musculoskeletal:     Left foot: Tenderness, bony tenderness, swelling and deformity present.       Feet:  Neurological:     Mental Status: He is alert.           Assessment & Plan:  Essential hypertension  Idiopathic gout, unspecified chronicity, unspecified site  I spent 25 minutes today with the patient discussing his medical diagnosis and answering his questions.  I explained the natural history of gout.  I recommended increasing his allopurinol to 300 mg a day.  I explained that this will drive the uric acid level lowering his blood.  I however also explained that the drop in uric acid can also trigger a gout attack until the uric acid concentration is reached steady state.  Therefore I will start the patient also on colchicine 0.6 mg daily.  He will continue these 2 medications for the next 2 months and then we will recheck a uric acid level.  We will continue to make adjustments until his uric acid level is less than 6  and hopefully in the 3-4 range.  I also recommended discontinuation of hydrochlorothiazide as this can exacerbate gout and I have recommended replacing that by increasing his lisinopril to 40  mg a day.  He will email me in 2 weeks and give me an update on his blood pressures and we will make permanent changes to his medical record regarding his medication if the 40 mg of lisinopril is working.  If that is insufficient, we could then next increase his amlodipine to 10 mg a day.

## 2018-11-20 ENCOUNTER — Encounter: Payer: Self-pay | Admitting: Family Medicine

## 2018-12-11 ENCOUNTER — Other Ambulatory Visit: Payer: Self-pay | Admitting: Family Medicine

## 2018-12-11 DIAGNOSIS — I10 Essential (primary) hypertension: Secondary | ICD-10-CM

## 2018-12-31 ENCOUNTER — Other Ambulatory Visit: Payer: Self-pay

## 2019-01-01 ENCOUNTER — Other Ambulatory Visit: Payer: Medicare Other

## 2019-01-01 ENCOUNTER — Other Ambulatory Visit: Payer: Self-pay

## 2019-01-01 DIAGNOSIS — M1000 Idiopathic gout, unspecified site: Secondary | ICD-10-CM

## 2019-01-01 DIAGNOSIS — I10 Essential (primary) hypertension: Secondary | ICD-10-CM

## 2019-01-01 DIAGNOSIS — R739 Hyperglycemia, unspecified: Secondary | ICD-10-CM

## 2019-01-01 DIAGNOSIS — E785 Hyperlipidemia, unspecified: Secondary | ICD-10-CM

## 2019-01-02 LAB — CBC WITH DIFFERENTIAL/PLATELET
Absolute Monocytes: 647 cells/uL (ref 200–950)
Basophils Absolute: 39 cells/uL (ref 0–200)
Basophils Relative: 0.5 %
Eosinophils Absolute: 343 cells/uL (ref 15–500)
Eosinophils Relative: 4.4 %
HCT: 46.9 % (ref 38.5–50.0)
Hemoglobin: 15.8 g/dL (ref 13.2–17.1)
Lymphs Abs: 2730 cells/uL (ref 850–3900)
MCH: 30.6 pg (ref 27.0–33.0)
MCHC: 33.7 g/dL (ref 32.0–36.0)
MCV: 90.7 fL (ref 80.0–100.0)
MPV: 11.5 fL (ref 7.5–12.5)
Monocytes Relative: 8.3 %
Neutro Abs: 4040 cells/uL (ref 1500–7800)
Neutrophils Relative %: 51.8 %
Platelets: 187 10*3/uL (ref 140–400)
RBC: 5.17 10*6/uL (ref 4.20–5.80)
RDW: 14 % (ref 11.0–15.0)
Total Lymphocyte: 35 %
WBC: 7.8 10*3/uL (ref 3.8–10.8)

## 2019-01-02 LAB — COMPREHENSIVE METABOLIC PANEL
AG Ratio: 1.7 (calc) (ref 1.0–2.5)
ALT: 37 U/L (ref 9–46)
AST: 31 U/L (ref 10–35)
Albumin: 4.3 g/dL (ref 3.6–5.1)
Alkaline phosphatase (APISO): 70 U/L (ref 35–144)
BUN: 14 mg/dL (ref 7–25)
CO2: 28 mmol/L (ref 20–32)
Calcium: 9.8 mg/dL (ref 8.6–10.3)
Chloride: 104 mmol/L (ref 98–110)
Creat: 1.11 mg/dL (ref 0.70–1.25)
Globulin: 2.6 g/dL (calc) (ref 1.9–3.7)
Glucose, Bld: 123 mg/dL — ABNORMAL HIGH (ref 65–99)
Potassium: 4.8 mmol/L (ref 3.5–5.3)
Sodium: 140 mmol/L (ref 135–146)
Total Bilirubin: 0.6 mg/dL (ref 0.2–1.2)
Total Protein: 6.9 g/dL (ref 6.1–8.1)

## 2019-01-02 LAB — HEMOGLOBIN A1C
Hgb A1c MFr Bld: 6.2 % of total Hgb — ABNORMAL HIGH (ref <5.7)
Mean Plasma Glucose: 131 (calc)
eAG (mmol/L): 7.3 (calc)

## 2019-01-02 LAB — LIPID PANEL
Cholesterol: 198 mg/dL (ref <200)
HDL: 44 mg/dL (ref >=40)
LDL Cholesterol (Calc): 118 mg/dL (calc) — ABNORMAL HIGH
Non-HDL Cholesterol (Calc): 154 mg/dL (calc) — ABNORMAL HIGH (ref <130)
Total CHOL/HDL Ratio: 4.5 (calc) (ref <5.0)
Triglycerides: 252 mg/dL — ABNORMAL HIGH (ref <150)

## 2019-01-02 LAB — URIC ACID: Uric Acid, Serum: 5.8 mg/dL (ref 4.0–8.0)

## 2019-01-04 ENCOUNTER — Encounter: Payer: Self-pay | Admitting: Family Medicine

## 2019-01-04 ENCOUNTER — Ambulatory Visit (INDEPENDENT_AMBULATORY_CARE_PROVIDER_SITE_OTHER): Payer: Medicare Other | Admitting: Family Medicine

## 2019-01-04 ENCOUNTER — Other Ambulatory Visit: Payer: Self-pay

## 2019-01-04 VITALS — BP 110/68 | HR 66 | Temp 98.4°F | Resp 16 | Ht 69.0 in | Wt 212.0 lb

## 2019-01-04 DIAGNOSIS — E7800 Pure hypercholesterolemia, unspecified: Secondary | ICD-10-CM | POA: Diagnosis not present

## 2019-01-04 DIAGNOSIS — I10 Essential (primary) hypertension: Secondary | ICD-10-CM

## 2019-01-04 DIAGNOSIS — R7303 Prediabetes: Secondary | ICD-10-CM

## 2019-01-04 DIAGNOSIS — M1000 Idiopathic gout, unspecified site: Secondary | ICD-10-CM

## 2019-01-04 NOTE — Progress Notes (Signed)
Subjective:    Patient ID: Daniel Blackwell, male    DOB: 05-03-1951, 68 y.o.   MRN: [id number]  HPI  Patient is here today for a follow-up of his chronic medical conditions.  His blood pressure today is well controlled.  He states that his blood pressure at home is typically in the 409-811 range systolic.  He denies any dizziness.  He denies any chest pain shortness of breath or dyspnea on exertion.  Since increasing allopurinol, his uric acid has fallen from 10-5.8.  He denies any rash or abdominal discomfort.  He is still taking colchicine.  His cholesterol remains elevated.  Patient is a borderline diabetic.  Hemoglobin A1c last year was 6.6.  However through a combination of diet and exercise and weight loss, his hemoglobin A1c has fallen to 6.2.  His most recent lab work is listed below: Lab on 01/01/2019  Component Date Value Ref Range Status  . Cholesterol 01/01/2019 198  <200 mg/dL Final  . HDL 01/01/2019 44  > OR = 40 mg/dL Final  . Triglycerides 01/01/2019 252* <150 mg/dL Final   Comment: . If a non-fasting specimen was collected, consider repeat triglyceride testing on a fasting specimen if clinically indicated.  Yates Decamp et al. J. of Clin. Lipidol. 9147;8:295-621. .   . LDL Cholesterol (Calc) 01/01/2019 118* mg/dL (calc) Final   Comment: Reference range: <100 . Desirable range <100 mg/dL for primary prevention;   <70 mg/dL for patients with CHD or diabetic patients  with > or = 2 CHD risk factors. Marland Kitchen LDL-C is now calculated using the Martin-Hopkins  calculation, which is a validated novel method providing  better accuracy than the Friedewald equation in the  estimation of LDL-C.  Cresenciano Genre et al. Annamaria Helling. 3086;578(46): 2061-2068  (http://education.[facility].com/faq/FAQ164)   . Total CHOL/HDL Ratio 01/01/2019 4.5  <5.0 (calc) Final  . Non-HDL Cholesterol (Calc) 01/01/2019 154* <130 mg/dL (calc) Final   Comment: For patients with diabetes plus 1 major ASCVD risk   factor, treating to a non-HDL-C goal of <100 mg/dL  (LDL-C of <70 mg/dL) is considered a therapeutic  option.   . WBC 01/01/2019 7.8  3.8 - 10.8 Thousand/uL Final  . RBC 01/01/2019 5.17  4.20 - 5.80 Million/uL Final  . Hemoglobin 01/01/2019 15.8  13.2 - 17.1 g/dL Final  . HCT 01/01/2019 46.9  38.5 - 50.0 % Final  . MCV 01/01/2019 90.7  80.0 - 100.0 fL Final  . MCH 01/01/2019 30.6  27.0 - 33.0 pg Final  . MCHC 01/01/2019 33.7  32.0 - 36.0 g/dL Final  . RDW 01/01/2019 14.0  11.0 - 15.0 % Final  . Platelets 01/01/2019 187  140 - 400 Thousand/uL Final  . MPV 01/01/2019 11.5  7.5 - 12.5 fL Final  . Neutro Abs 01/01/2019 4,040  1,500 - 7,800 cells/uL Final  . Lymphs Abs 01/01/2019 2,730  850 - 3,900 cells/uL Final  . Absolute Monocytes 01/01/2019 647  200 - 950 cells/uL Final  . Eosinophils Absolute 01/01/2019 343  15 - 500 cells/uL Final  . Basophils Absolute 01/01/2019 39  0 - 200 cells/uL Final  . Neutrophils Relative % 01/01/2019 51.8  % Final  . Total Lymphocyte 01/01/2019 35.0  % Final  . Monocytes Relative 01/01/2019 8.3  % Final  . Eosinophils Relative 01/01/2019 4.4  % Final  . Basophils Relative 01/01/2019 0.5  % Final  . Glucose, Bld 01/01/2019 123* 65 - 99 mg/dL Final   Comment: .  Fasting reference interval . For someone without known diabetes, a glucose value between 100 and 125 mg/dL is consistent with prediabetes and should be confirmed with a follow-up test. .   . BUN 01/01/2019 14  7 - 25 mg/dL Final  . Creat 01/01/2019 1.11  0.70 - 1.25 mg/dL Final   Comment: For patients >41 years of age, the reference limit for Creatinine is approximately 13% higher for people identified as African-American. .   Havery Moros Ratio 27/78/2423 NOT APPLICABLE  6 - 22 (calc) Final  . Sodium 01/01/2019 140  135 - 146 mmol/L Final  . Potassium 01/01/2019 4.8  3.5 - 5.3 mmol/L Final  . Chloride 01/01/2019 104  98 - 110 mmol/L Final  . CO2 01/01/2019 28  20 - 32  mmol/L Final  . Calcium 01/01/2019 9.8  8.6 - 10.3 mg/dL Final  . Total Protein 01/01/2019 6.9  6.1 - 8.1 g/dL Final  . Albumin 01/01/2019 4.3  3.6 - 5.1 g/dL Final  . Globulin 01/01/2019 2.6  1.9 - 3.7 g/dL (calc) Final  . AG Ratio 01/01/2019 1.7  1.0 - 2.5 (calc) Final  . Total Bilirubin 01/01/2019 0.6  0.2 - 1.2 mg/dL Final  . Alkaline phosphatase (APISO) 01/01/2019 70  35 - 144 U/L Final  . AST 01/01/2019 31  10 - 35 U/L Final  . ALT 01/01/2019 37  9 - 46 U/L Final  . Hgb A1c MFr Bld 01/01/2019 6.2* <5.7 % of total Hgb Final   Comment: For someone without known diabetes, a hemoglobin  A1c value between 5.7% and 6.4% is consistent with prediabetes and should be confirmed with a  follow-up test. . For someone with known diabetes, a value <7% indicates that their diabetes is well controlled. A1c targets should be individualized based on duration of diabetes, age, comorbid conditions, and other considerations. . This assay result is consistent with an increased risk of diabetes. . Currently, no consensus exists regarding use of hemoglobin A1c for diagnosis of diabetes for children. .   . Mean Plasma Glucose 01/01/2019 131  (calc) Final  . eAG (mmol/L) 01/01/2019 7.3  (calc) Final  . Uric Acid, Serum 01/01/2019 5.8  4.0 - 8.0 mg/dL Final   Comment: Therapeutic target for gout patients: <6.0 mg/dL .     Past Medical History:  Diagnosis Date  . Allergy    mold/yeast  . Bipolar 1 disorder ([place])   . Colon polyps   . Hyperglycemia   . Hyperlipidemia   . Hypertension    Past Surgical History:  Procedure Laterality Date  . EYE SURGERY     laser eye surgery mar 2018, and april 2018   Current Outpatient Medications on File Prior to Visit  Medication Sig Dispense Refill  . allopurinol (ZYLOPRIM) 300 MG tablet Take 1 tablet (300 mg total) by mouth daily. 30 tablet 6  . aspirin 81 MG tablet Take 81 mg by mouth daily.    . cetirizine (ZYRTEC) 10 MG tablet Take 10 mg by mouth  daily. Reported on 06/19/2015    . colchicine 0.6 MG tablet Take 1 tablet (0.6 mg total) by mouth daily. 30 tablet 2  . indomethacin (INDOCIN) 50 MG capsule Take 1 capsule (50 mg total) by mouth 3 (three) times daily with meals. 21 capsule 0  . lisinopril (ZESTRIL) 20 MG tablet TAKE 1 TABLET(20 MG) BY MOUTH DAILY 90 tablet 2  . Multiple Vitamin (MULTIVITAMIN) tablet Take 1 tablet by mouth daily.    . Omega-3 Fatty  Acids (FISH OIL) 1000 MG CPDR Take 2 capsules by mouth daily.    . phenylephrine (SUDAFED PE CONGESTION) 10 MG TABS tablet Take 10 mg by mouth every 4 (four) hours as needed.    . vitamin B-12 (CYANOCOBALAMIN) 1000 MCG tablet Take 1,000 mcg by mouth daily.     Marland Kitchen amLODipine (NORVASC) 5 MG tablet Take 1 tablet (5 mg total) by mouth daily. 30 tablet 11   No current facility-administered medications on file prior to visit.    Allergies  Allergen Reactions  . Asa [Aspirin] Swelling  . Other Swelling    CASHEWS  . Penicillins Rash  . Sulfa Antibiotics Rash   Social History   Socioeconomic History  . Marital status: Married    Spouse name: Not on file  . Number of children: Not on file  . Years of education: Not on file  . Highest education level: Not on file  Occupational History  . Occupation: Database administrator, Conservation officer, nature    Comment: [employer]  Social Needs  . Financial resource strain: Not on file  . Food insecurity    Worry: Not on file    Inability: Not on file  . Transportation needs    Medical: Not on file    Non-medical: Not on file  Tobacco Use  . Smoking status: Former Smoker    Quit date: 04/04/1983    Years since quitting: 35.7  . Smokeless tobacco: Never Used  Substance and Sexual Activity  . Alcohol use: Yes    Alcohol/week: 1.0 standard drinks    Types: 1 drink(s) per week  . Drug use: No  . Sexual activity: Not on file  Lifestyle  . Physical activity    Days per week: Not on file    Minutes per session: Not on file  . Stress: Not on file   Relationships  . Social Herbalist on phone: Not on file    Gets together: Not on file    Attends religious service: Not on file    Active member of club or organization: Not on file    Attends meetings of clubs or organizations: Not on file    Relationship status: Not on file  . Intimate partner violence    Fear of current or ex partner: Not on file    Emotionally abused: Not on file    Physically abused: Not on file    Forced sexual activity: Not on file  Other Topics Concern  . 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   At certain times of the year he works long hours   but then at certain times of the year works a few hours.     Review of Systems  All other systems reviewed and are negative.      Objective:   Physical Exam Vitals signs reviewed.  Constitutional:      Appearance: Normal appearance.  Cardiovascular:     Rate and Rhythm: Normal rate and regular rhythm.  Pulmonary:     Effort: Pulmonary effort is normal.     Breath sounds: Normal breath sounds.  Neurological:     Mental Status: He is alert.           Assessment & Plan:  The primary encounter diagnosis was Essential hypertension. Diagnoses of Idiopathic gout, unspecified chronicity, unspecified site, Pure  hypercholesterolemia, and Prediabetes were also pertinent to this visit. Blood pressure today is well controlled.  I will make no changes in his antihypertensives.  His uric acid level is now less than 6.  Continue allopurinol 300 mg a day and discontinue colchicine and only use this for flares.  Patient does have an LDL cholesterol greater than 100.  He is also prediabetic.  Therefore a statin medication would be justified however the patient is hesitant to take any additional cholesterol medication and would like to continue to work on diet exercise and weight loss to  manage his dyslipidemia.  Prediabetes has improved dramatically.  I recommended a low carbohydrate diet.  Recheck fasting lab work in 6 months.

## 2019-01-25 ENCOUNTER — Other Ambulatory Visit: Payer: Self-pay | Admitting: Family Medicine

## 2019-02-14 ENCOUNTER — Encounter: Payer: Self-pay | Admitting: Family Medicine

## 2019-04-26 ENCOUNTER — Other Ambulatory Visit: Payer: Self-pay | Admitting: Family Medicine

## 2019-05-03 ENCOUNTER — Other Ambulatory Visit: Payer: Self-pay | Admitting: Family Medicine

## 2019-05-03 MED ORDER — ALLOPURINOL 300 MG PO TABS: ORAL_TABLET | ORAL | 6 refills | Status: DC

## 2019-05-14 ENCOUNTER — Encounter: Payer: Self-pay | Admitting: Family Medicine

## 2019-05-14 DIAGNOSIS — I10 Essential (primary) hypertension: Secondary | ICD-10-CM

## 2019-05-14 MED ORDER — AMLODIPINE BESYLATE 5 MG PO TABS: 5.0000 mg | ORAL_TABLET | Freq: Every day | ORAL | 11 refills | Status: DC

## 2019-07-10 ENCOUNTER — Encounter: Payer: Self-pay | Admitting: Family Medicine

## 2019-07-15 HISTORY — PX: COLONOSCOPY: SHX174

## 2019-07-19 ENCOUNTER — Other Ambulatory Visit: Payer: Self-pay

## 2019-07-19 ENCOUNTER — Ambulatory Visit (INDEPENDENT_AMBULATORY_CARE_PROVIDER_SITE_OTHER): Payer: Medicare PPO | Admitting: Family Medicine

## 2019-07-19 VITALS — BP 150/90 | HR 60 | Temp 97.4°F | Resp 14 | Ht 69.0 in | Wt 216.0 lb

## 2019-07-19 DIAGNOSIS — T7840XA Allergy, unspecified, initial encounter: Secondary | ICD-10-CM | POA: Diagnosis not present

## 2019-07-19 DIAGNOSIS — L509 Urticaria, unspecified: Secondary | ICD-10-CM

## 2019-07-19 MED ORDER — EPINEPHRINE 0.3 MG/0.3ML IJ SOAJ: 0.3000 mg | INTRAMUSCULAR | 1 refills | Status: AC | PRN

## 2019-07-19 MED ORDER — FAMOTIDINE 40 MG PO TABS: 40.0000 mg | ORAL_TABLET | Freq: Every day | ORAL | 1 refills | Status: DC

## 2019-07-19 MED ORDER — LEVOCETIRIZINE DIHYDROCHLORIDE 5 MG PO TABS: 5.0000 mg | ORAL_TABLET | Freq: Every evening | ORAL | 0 refills | Status: DC

## 2019-07-19 NOTE — Progress Notes (Signed)
Subjective:    Patient ID: Daniel Blackwell, male    DOB: 04-01-1951, 69 y.o.   MRN: [id number]  HPI Patient is a 69 year old Caucasian male who presents today with an intermittent rash.  He reports developing hives and urticaria in his axilla and along his arms randomly for no reason.  He has also developed hives on his flanks and on his lower abdomen.  They itch.  He describes them as welts.  They come and go without provocation.  They happen several times a week.  He is also developed swelling and itching in his lips.  Says he contacted me I had the patient discontinue lisinopril however he continues to have intermittent urticaria.  He has not had any further angioedema since he stopped lisinopril however that was relatively recently so were not sure if this is the true cause. Past Medical History:  Diagnosis Date  . Allergy    mold/yeast  . Bipolar 1 disorder ([place])   . Colon polyps   . Hyperglycemia   . Hyperlipidemia   . Hypertension    Past Surgical History:  Procedure Laterality Date  . EYE SURGERY     laser eye surgery mar 2018, and april 2018   Current Outpatient Medications on File Prior to Visit  Medication Sig Dispense Refill  . allopurinol (ZYLOPRIM) 300 MG tablet TAKE 1 TABLET(300 MG) BY MOUTH DAILY 30 tablet 6  . amLODipine (NORVASC) 5 MG tablet Take 1 tablet (5 mg total) by mouth daily. 30 tablet 11  . aspirin 81 MG tablet Take 81 mg by mouth daily.    . cetirizine (ZYRTEC) 10 MG tablet Take 10 mg by mouth daily. Reported on 06/19/2015    . colchicine 0.6 MG tablet TAKE 1 TABLET(0.6 MG) BY MOUTH DAILY 30 tablet 2  . indomethacin (INDOCIN) 50 MG capsule Take 1 capsule (50 mg total) by mouth 3 (three) times daily with meals. 21 capsule 0  . lisinopril (ZESTRIL) 20 MG tablet TAKE 1 TABLET(20 MG) BY MOUTH DAILY 90 tablet 2  . Multiple Vitamin (MULTIVITAMIN) tablet Take 1 tablet by mouth daily.    . Omega-3 Fatty Acids (FISH OIL) 1000 MG CPDR Take 2 capsules by mouth daily.     . phenylephrine (SUDAFED PE CONGESTION) 10 MG TABS tablet Take 10 mg by mouth every 4 (four) hours as needed.    . vitamin B-12 (CYANOCOBALAMIN) 1000 MCG tablet Take 1,000 mcg by mouth daily.      No current facility-administered medications on file prior to visit.   Allergies  Allergen Reactions  . Asa [Aspirin] Swelling  . Other Swelling    CASHEWS  . Penicillins Rash  . Sulfa Antibiotics Rash   Social History   Socioeconomic History  . Marital status: Married    Spouse name: Not on file  . Number of children: Not on file  . Years of education: Not on file  . Highest education level: Not on file  Occupational History  . Occupation: State, Conservation officer, nature    Comment: School System  Tobacco Use  . Smoking status: Former Smoker    Quit date: 04/04/1983    Years since quitting: 36.3  . Smokeless tobacco: Never Used  Substance and Sexual Activity  . Alcohol use: Yes    Alcohol/week: 1.0 standard drinks    Types: 1 drink(s) per week  . Drug use: No  . Sexual activity: Not on file  Other Topics Concern  . 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   At certain times of the year he works long hours   but then at certain times of the year works a few hours.   Social Determinants of Health   Financial Resource Strain:   . Difficulty of Paying Living Expenses: Not on file  Food Insecurity:   . Worried About Charity fundraiser in the Last Year: Not on file  . Ran Out of Food in the Last Year: Not on file  Transportation Needs:   . Lack of Transportation (Medical): Not on file  . Lack of Transportation (Non-Medical): Not on file  Physical Activity:   . Days of Exercise per Week: Not on file  . Minutes of Exercise per Session: Not on file  Stress:   . Feeling of Stress : Not on file  Social Connections:   . Frequency of Communication with Friends and  Family: Not on file  . Frequency of Social Gatherings with Friends and Family: Not on file  . Attends Religious Services: Not on file  . Active Member of Clubs or Organizations: Not on file  . Attends Archivist Meetings: Not on file  . Marital Status: Not on file  Intimate Partner Violence:   . Fear of Current or Ex-Partner: Not on file  . Emotionally Abused: Not on file  . Physically Abused: Not on file  . Sexually Abused: Not on file     Review of Systems  All other systems reviewed and are negative.      Objective:   Physical Exam Vitals reviewed.  Constitutional:      Appearance: Normal appearance.  Cardiovascular:     Rate and Rhythm: Normal rate and regular rhythm.  Pulmonary:     Effort: Pulmonary effort is normal.     Breath sounds: Normal breath sounds.  Neurological:     Mental Status: He is alert.           Assessment & Plan:  Urticaria - Plan: Alpha-Gal Panel, Allergy Panel 19, Seafood Group, Allergy Panel 18, Nut Mix Group, levocetirizine (XYZAL) 5 MG tablet, famotidine (PEPCID) 40 MG tablet, EPINEPHrine 0.3 mg/0.3 mL IJ SOAJ injection  Given the fact the patient is having urticaria and angioedema I am concerned about allergic reactions.  Given this new onset, I have recommended screen the patient for alpha gal.  I will also screen the patient for seafood allergy as well as none allergies.  Meanwhile I have recommended that he continue to refrain from using lisinopril.  We will start Xyzal 5 mg and discontinue Zyrtec.  We will start Pepcid 40 mg a day.  I did give the patient a prescription for an EpiPen in case he develops difficulty breathing.  Await labs

## 2019-07-24 LAB — ALLERGY PANEL 18, NUT MIX GROUP
Almonds: <0.1 kU/L
CLASS: 0
CLASS: 0
CLASS: 0
CLASS: 0
CLASS: 0
Cashew IgE: <0.1 kU/L
Class: 0
Coconut: <0.1 kU/L
Hazelnut: <0.1 kU/L
Peanut IgE: <0.1 kU/L
Pecan Nut: <0.1 kU/L
Sesame Seed f10: 0.11 kU/L — ABNORMAL HIGH

## 2019-07-24 LAB — ALLERGY PANEL 19, SEAFOOD GROUP
Allergen, Salmon, f41: <0.1 kU/L
CLASS: 0
CLASS: 0
CLASS: 0
CLASS: 1
CLASS: 1
Class: 2
Crab: 0.41 kU/L — ABNORMAL HIGH
Fish Cod: <0.1 kU/L
Lobster: 0.45 kU/L — ABNORMAL HIGH
Shrimp IgE: 0.73 kU/L — ABNORMAL HIGH
Tuna IgE: <0.1 kU/L

## 2019-07-24 LAB — ALPHA-GAL PANEL
Beef IgE: 0.51 kU/L — ABNORMAL HIGH (ref <0.35)
Class: 1
Galactose-alpha-1,3-galactose IgE: 1.49 kU/L — ABNORMAL HIGH (ref <0.10)
LAMB/MUTTON IGE: 0.12 kU/L (ref <0.35)
Pork IgE: 0.19 kU/L (ref <0.35)

## 2019-07-24 LAB — INTERPRETATION:

## 2019-07-25 ENCOUNTER — Encounter: Payer: Self-pay | Admitting: Family Medicine

## 2019-07-25 DIAGNOSIS — T781XXA Other adverse food reactions, not elsewhere classified, initial encounter: Secondary | ICD-10-CM | POA: Insufficient documentation

## 2019-07-31 DIAGNOSIS — D120 Benign neoplasm of cecum: Secondary | ICD-10-CM | POA: Diagnosis not present

## 2019-07-31 DIAGNOSIS — K640 First degree hemorrhoids: Secondary | ICD-10-CM | POA: Diagnosis not present

## 2019-07-31 DIAGNOSIS — Z86010 Personal history of colonic polyps: Secondary | ICD-10-CM | POA: Diagnosis not present

## 2019-08-02 ENCOUNTER — Emergency Department (HOSPITAL_COMMUNITY): Payer: Medicare PPO | Admitting: Anesthesiology

## 2019-08-02 ENCOUNTER — Inpatient Hospital Stay (HOSPITAL_COMMUNITY)
Admission: EM | Admit: 2019-08-02 | Discharge: 2019-08-07 | DRG: 329 | Disposition: A | Discharge status: Home / Self Care | Payer: Medicare PPO | Attending: General Surgery | Admitting: General Surgery

## 2019-08-02 ENCOUNTER — Encounter (HOSPITAL_COMMUNITY): Payer: Self-pay

## 2019-08-02 ENCOUNTER — Encounter (HOSPITAL_COMMUNITY): Admission: EM | Disposition: A | Discharge status: Home / Self Care | Payer: Self-pay | Source: Home / Self Care

## 2019-08-02 ENCOUNTER — Emergency Department (HOSPITAL_COMMUNITY): Payer: Medicare PPO

## 2019-08-02 ENCOUNTER — Other Ambulatory Visit: Payer: Self-pay

## 2019-08-02 DIAGNOSIS — E669 Obesity, unspecified: Secondary | ICD-10-CM | POA: Diagnosis present

## 2019-08-02 DIAGNOSIS — I10 Essential (primary) hypertension: Secondary | ICD-10-CM | POA: Diagnosis present

## 2019-08-02 DIAGNOSIS — Z7982 Long term (current) use of aspirin: Secondary | ICD-10-CM

## 2019-08-02 DIAGNOSIS — E785 Hyperlipidemia, unspecified: Secondary | ICD-10-CM | POA: Diagnosis not present

## 2019-08-02 DIAGNOSIS — Z880 Allergy status to penicillin: Secondary | ICD-10-CM | POA: Diagnosis not present

## 2019-08-02 DIAGNOSIS — Z886 Allergy status to analgesic agent status: Secondary | ICD-10-CM | POA: Diagnosis not present

## 2019-08-02 DIAGNOSIS — R1084 Generalized abdominal pain: Secondary | ICD-10-CM

## 2019-08-02 DIAGNOSIS — Z882 Allergy status to sulfonamides status: Secondary | ICD-10-CM | POA: Diagnosis not present

## 2019-08-02 DIAGNOSIS — Z86010 Personal history of colonic polyps: Secondary | ICD-10-CM

## 2019-08-02 DIAGNOSIS — Z03818 Encounter for observation for suspected exposure to other biological agents ruled out: Secondary | ICD-10-CM | POA: Diagnosis not present

## 2019-08-02 DIAGNOSIS — Z87891 Personal history of nicotine dependence: Secondary | ICD-10-CM

## 2019-08-02 DIAGNOSIS — Z6832 Body mass index (BMI) 32.0-32.9, adult: Secondary | ICD-10-CM

## 2019-08-02 DIAGNOSIS — K668 Other specified disorders of peritoneum: Secondary | ICD-10-CM | POA: Diagnosis not present

## 2019-08-02 DIAGNOSIS — Z79899 Other long term (current) drug therapy: Secondary | ICD-10-CM | POA: Diagnosis not present

## 2019-08-02 DIAGNOSIS — R000 Tachycardia, unspecified: Secondary | ICD-10-CM | POA: Diagnosis not present

## 2019-08-02 DIAGNOSIS — Z20822 Contact with and (suspected) exposure to covid-19: Secondary | ICD-10-CM | POA: Diagnosis not present

## 2019-08-02 DIAGNOSIS — K55029 Acute infarction of small intestine, extent unspecified: Secondary | ICD-10-CM | POA: Diagnosis not present

## 2019-08-02 DIAGNOSIS — K55049 Acute infarction of large intestine, extent unspecified: Secondary | ICD-10-CM | POA: Diagnosis not present

## 2019-08-02 DIAGNOSIS — K631 Perforation of intestine (nontraumatic): Secondary | ICD-10-CM | POA: Diagnosis not present

## 2019-08-02 DIAGNOSIS — M109 Gout, unspecified: Secondary | ICD-10-CM | POA: Diagnosis present

## 2019-08-02 DIAGNOSIS — K658 Other peritonitis: Secondary | ICD-10-CM | POA: Diagnosis not present

## 2019-08-02 DIAGNOSIS — K635 Polyp of colon: Secondary | ICD-10-CM | POA: Diagnosis present

## 2019-08-02 DIAGNOSIS — F319 Bipolar disorder, unspecified: Secondary | ICD-10-CM | POA: Diagnosis present

## 2019-08-02 DIAGNOSIS — R109 Unspecified abdominal pain: Secondary | ICD-10-CM | POA: Diagnosis not present

## 2019-08-02 DIAGNOSIS — K650 Generalized (acute) peritonitis: Secondary | ICD-10-CM | POA: Diagnosis not present

## 2019-08-02 DIAGNOSIS — Z91018 Allergy to other foods: Secondary | ICD-10-CM | POA: Diagnosis not present

## 2019-08-02 HISTORY — PX: LAPAROTOMY: SHX154

## 2019-08-02 HISTORY — PX: COLON SURGERY: SHX602

## 2019-08-02 LAB — SAMPLE TO BLOOD BANK

## 2019-08-02 LAB — COMPREHENSIVE METABOLIC PANEL
ALT: 26 U/L (ref 0–44)
AST: 19 U/L (ref 15–41)
Albumin: 3.8 g/dL (ref 3.5–5.0)
Alkaline Phosphatase: 68 U/L (ref 38–126)
Anion gap: 12 (ref 5–15)
BUN: 11 mg/dL (ref 8–23)
CO2: 24 mmol/L (ref 22–32)
Calcium: 9.1 mg/dL (ref 8.9–10.3)
Chloride: 98 mmol/L (ref 98–111)
Creatinine, Ser: 1.15 mg/dL (ref 0.61–1.24)
GFR calc Af Amer: >60 mL/min (ref >60)
GFR calc non Af Amer: >60 mL/min (ref >60)
Glucose, Bld: 186 mg/dL — ABNORMAL HIGH (ref 70–99)
Potassium: 3.9 mmol/L (ref 3.5–5.1)
Sodium: 134 mmol/L — ABNORMAL LOW (ref 135–145)
Total Bilirubin: 1.5 mg/dL — ABNORMAL HIGH (ref 0.3–1.2)
Total Protein: 7.6 g/dL (ref 6.5–8.1)

## 2019-08-02 LAB — CBC
HCT: 45.1 % (ref 39.0–52.0)
Hemoglobin: 15.3 g/dL (ref 13.0–17.0)
MCH: 31.1 pg (ref 26.0–34.0)
MCHC: 33.9 g/dL (ref 30.0–36.0)
MCV: 91.7 fL (ref 80.0–100.0)
Platelets: 186 10*3/uL (ref 150–400)
RBC: 4.92 MIL/uL (ref 4.22–5.81)
RDW: 13.3 % (ref 11.5–15.5)
WBC: 12.5 10*3/uL — ABNORMAL HIGH (ref 4.0–10.5)
nRBC: 0 % (ref 0.0–0.2)

## 2019-08-02 LAB — URINALYSIS, ROUTINE W REFLEX MICROSCOPIC
Bilirubin Urine: NEGATIVE
Glucose, UA: NEGATIVE mg/dL
Hgb urine dipstick: NEGATIVE
Ketones, ur: NEGATIVE mg/dL
Leukocytes,Ua: NEGATIVE
Nitrite: NEGATIVE
Protein, ur: NEGATIVE mg/dL
Specific Gravity, Urine: 1.018 (ref 1.005–1.030)
pH: 5 (ref 5.0–8.0)

## 2019-08-02 LAB — RESPIRATORY PANEL BY RT PCR (FLU A&B, COVID)
Influenza A by PCR: NEGATIVE
Influenza B by PCR: NEGATIVE
SARS Coronavirus 2 by RT PCR: NEGATIVE

## 2019-08-02 LAB — LIPASE, BLOOD: Lipase: 21 U/L (ref 11–51)

## 2019-08-02 SURGERY — LAPAROTOMY, EXPLORATORY
Anesthesia: General

## 2019-08-02 MED ORDER — CHLORHEXIDINE GLUCONATE CLOTH 2 % EX PADS: 6.0000 | MEDICATED_PAD | Freq: Every day | CUTANEOUS | Status: DC

## 2019-08-02 MED ORDER — SUGAMMADEX SODIUM 200 MG/2ML IV SOLN
INTRAVENOUS | Status: DC | PRN
  Administered 2019-08-02: 200 mg via INTRAVENOUS

## 2019-08-02 MED ORDER — MIDAZOLAM HCL 2 MG/2ML IJ SOLN
INTRAMUSCULAR | Status: AC
  Filled 2019-08-02: qty 2

## 2019-08-02 MED ORDER — METRONIDAZOLE IN NACL 5-0.79 MG/ML-% IV SOLN
500.0000 mg | Freq: Once | INTRAVENOUS | Status: AC
  Administered 2019-08-02: 15:00:00 500 mg via INTRAVENOUS
  Filled 2019-08-02: qty 100

## 2019-08-02 MED ORDER — ACETAMINOPHEN 10 MG/ML IV SOLN
1000.0000 mg | Freq: Four times a day (QID) | INTRAVENOUS | Status: AC
  Administered 2019-08-02 – 2019-08-03 (×4): 1000 mg via INTRAVENOUS
  Filled 2019-08-02 (×4): qty 100

## 2019-08-02 MED ORDER — PIPERACILLIN-TAZOBACTAM 3.375 G IVPB
3.3750 g | Freq: Three times a day (TID) | INTRAVENOUS | Status: DC
  Administered 2019-08-02 – 2019-08-06 (×11): 3.375 g via INTRAVENOUS
  Filled 2019-08-02 (×11): qty 50

## 2019-08-02 MED ORDER — FENTANYL CITRATE (PF) 100 MCG/2ML IJ SOLN
INTRAMUSCULAR | Status: DC | PRN
  Administered 2019-08-02: 50 ug via INTRAVENOUS
  Administered 2019-08-02: 100 ug via INTRAVENOUS

## 2019-08-02 MED ORDER — ONDANSETRON HCL 4 MG/2ML IJ SOLN
INTRAMUSCULAR | Status: DC | PRN
  Administered 2019-08-02: 4 mg via INTRAVENOUS

## 2019-08-02 MED ORDER — SODIUM CHLORIDE 0.9 % IV BOLUS: 500.0000 mL | Freq: Once | INTRAVENOUS | Status: DC

## 2019-08-02 MED ORDER — FENTANYL CITRATE (PF) 250 MCG/5ML IJ SOLN
INTRAMUSCULAR | Status: AC
  Filled 2019-08-02: qty 5

## 2019-08-02 MED ORDER — LIDOCAINE 2% (20 MG/ML) 5 ML SYRINGE
INTRAMUSCULAR | Status: DC | PRN
  Administered 2019-08-02: 100 mg via INTRAVENOUS

## 2019-08-02 MED ORDER — ONDANSETRON HCL 4 MG PO TABS: 4.0000 mg | ORAL_TABLET | Freq: Four times a day (QID) | ORAL | Status: DC | PRN

## 2019-08-02 MED ORDER — METOPROLOL TARTRATE 5 MG/5ML IV SOLN: 5.0000 mg | Freq: Four times a day (QID) | INTRAVENOUS | Status: DC | PRN

## 2019-08-02 MED ORDER — SUCCINYLCHOLINE CHLORIDE 20 MG/ML IJ SOLN
INTRAMUSCULAR | Status: DC | PRN
  Administered 2019-08-02: 140 mg via INTRAVENOUS

## 2019-08-02 MED ORDER — MIDAZOLAM HCL 5 MG/5ML IJ SOLN
INTRAMUSCULAR | Status: DC | PRN
  Administered 2019-08-02: 2 mg via INTRAVENOUS

## 2019-08-02 MED ORDER — ONDANSETRON HCL 4 MG/2ML IJ SOLN: 4.0000 mg | Freq: Four times a day (QID) | INTRAMUSCULAR | Status: DC | PRN

## 2019-08-02 MED ORDER — LACTATED RINGERS IV SOLN: INTRAVENOUS | Status: DC | PRN

## 2019-08-02 MED ORDER — IOHEXOL 300 MG/ML  SOLN
100.0000 mL | Freq: Once | INTRAMUSCULAR | Status: AC | PRN
  Administered 2019-08-02: 13:00:00 100 mL via INTRAVENOUS

## 2019-08-02 MED ORDER — 0.9 % SODIUM CHLORIDE (POUR BTL) OPTIME
TOPICAL | Status: DC | PRN
  Administered 2019-08-02: 2000 mL
  Administered 2019-08-02: 1000 mL
  Administered 2019-08-02: 2000 mL
  Administered 2019-08-02: 1000 mL

## 2019-08-02 MED ORDER — ENSURE SURGERY PO LIQD
237.0000 mL | Freq: Two times a day (BID) | ORAL | Status: DC
  Administered 2019-08-03 – 2019-08-05 (×3): 237 mL via ORAL
  Filled 2019-08-02 (×10): qty 237

## 2019-08-02 MED ORDER — PROPOFOL 10 MG/ML IV BOLUS
INTRAVENOUS | Status: AC
  Filled 2019-08-02: qty 20

## 2019-08-02 MED ORDER — AMLODIPINE BESYLATE 5 MG PO TABS
5.0000 mg | ORAL_TABLET | Freq: Every day | ORAL | Status: DC
  Administered 2019-08-03 – 2019-08-07 (×5): 5 mg via ORAL
  Filled 2019-08-02 (×5): qty 1

## 2019-08-02 MED ORDER — ENOXAPARIN SODIUM 40 MG/0.4ML ~~LOC~~ SOLN
40.0000 mg | SUBCUTANEOUS | Status: DC
  Administered 2019-08-03 – 2019-08-07 (×5): 40 mg via SUBCUTANEOUS
  Filled 2019-08-02 (×5): qty 0.4

## 2019-08-02 MED ORDER — LISINOPRIL 20 MG PO TABS
20.0000 mg | ORAL_TABLET | Freq: Every day | ORAL | Status: DC
  Administered 2019-08-03 – 2019-08-07 (×5): 20 mg via ORAL
  Filled 2019-08-02 (×5): qty 1

## 2019-08-02 MED ORDER — METHOCARBAMOL 1000 MG/10ML IJ SOLN
500.0000 mg | Freq: Three times a day (TID) | INTRAVENOUS | Status: DC
  Administered 2019-08-02 – 2019-08-05 (×8): 500 mg via INTRAVENOUS
  Filled 2019-08-02: qty 500
  Filled 2019-08-02 (×7): qty 5
  Filled 2019-08-02 (×2): qty 500

## 2019-08-02 MED ORDER — ALLOPURINOL 300 MG PO TABS
300.0000 mg | ORAL_TABLET | Freq: Every day | ORAL | Status: DC
  Administered 2019-08-03 – 2019-08-07 (×5): 300 mg via ORAL
  Filled 2019-08-02 (×5): qty 1

## 2019-08-02 MED ORDER — PROPOFOL 10 MG/ML IV BOLUS
INTRAVENOUS | Status: DC | PRN
  Administered 2019-08-02: 150 mg via INTRAVENOUS

## 2019-08-02 MED ORDER — CIPROFLOXACIN IN D5W 400 MG/200ML IV SOLN
400.0000 mg | Freq: Once | INTRAVENOUS | Status: AC
  Administered 2019-08-02: 14:00:00 400 mg via INTRAVENOUS
  Filled 2019-08-02: qty 200

## 2019-08-02 MED ORDER — SODIUM CHLORIDE 0.9 % IV BOLUS
500.0000 mL | Freq: Once | INTRAVENOUS | Status: AC
  Administered 2019-08-02: 11:00:00 500 mL via INTRAVENOUS

## 2019-08-02 MED ORDER — FENTANYL CITRATE (PF) 100 MCG/2ML IJ SOLN
50.0000 ug | INTRAMUSCULAR | Status: DC | PRN
  Administered 2019-08-02: 50 ug via INTRAVENOUS
  Filled 2019-08-02: qty 2

## 2019-08-02 MED ORDER — FAMOTIDINE 20 MG PO TABS
40.0000 mg | ORAL_TABLET | Freq: Every day | ORAL | Status: DC
  Administered 2019-08-02 – 2019-08-07 (×6): 40 mg via ORAL
  Filled 2019-08-02 (×6): qty 2

## 2019-08-02 MED ORDER — ROCURONIUM BROMIDE 50 MG/5ML IV SOSY
PREFILLED_SYRINGE | INTRAVENOUS | Status: DC | PRN
  Administered 2019-08-02: 60 mg via INTRAVENOUS
  Administered 2019-08-02: 20 mg via INTRAVENOUS

## 2019-08-02 MED ORDER — SODIUM CHLORIDE 0.9 % IV SOLN: INTRAVENOUS | Status: DC

## 2019-08-02 MED ORDER — HYDROMORPHONE HCL 1 MG/ML IJ SOLN
1.0000 mg | INTRAMUSCULAR | Status: DC | PRN
  Administered 2019-08-02 – 2019-08-05 (×6): 1 mg via INTRAVENOUS
  Filled 2019-08-02 (×7): qty 1

## 2019-08-02 SURGICAL SUPPLY — 48 items
APL PRP STRL LF DISP 70% ISPRP (MISCELLANEOUS) ×1
BLADE CLIPPER SURG (BLADE) ×2 IMPLANT
BNDG GAUZE ELAST 4 BULKY (GAUZE/BANDAGES/DRESSINGS) ×2 IMPLANT
CANISTER SUCT 3000ML PPV (MISCELLANEOUS) ×3 IMPLANT
CHLORAPREP W/TINT 26 (MISCELLANEOUS) ×3 IMPLANT
COVER SURGICAL LIGHT HANDLE (MISCELLANEOUS) ×3 IMPLANT
DRAPE LAPAROSCOPIC ABDOMINAL (DRAPES) ×3 IMPLANT
DRAPE WARM FLUID 44X44 (DRAPES) ×3 IMPLANT
DRSG OPSITE POSTOP 4X10 (GAUZE/BANDAGES/DRESSINGS) IMPLANT
DRSG OPSITE POSTOP 4X8 (GAUZE/BANDAGES/DRESSINGS) IMPLANT
ELECT BLADE 6.5 EXT (BLADE) ×2 IMPLANT
ELECT CAUTERY BLADE 6.4 (BLADE) ×3 IMPLANT
ELECT REM PT RETURN 9FT ADLT (ELECTROSURGICAL) ×3
ELECTRODE REM PT RTRN 9FT ADLT (ELECTROSURGICAL) ×1 IMPLANT
GAUZE SPONGE 4X4 12PLY STRL LF (GAUZE/BANDAGES/DRESSINGS) ×2 IMPLANT
GLOVE BIO SURGEON STRL SZ7.5 (GLOVE) ×3 IMPLANT
GLOVE BIOGEL PI IND STRL 8 (GLOVE) ×1 IMPLANT
GLOVE BIOGEL PI INDICATOR 8 (GLOVE) ×2
GOWN STRL REUS W/ TWL LRG LVL3 (GOWN DISPOSABLE) ×1 IMPLANT
GOWN STRL REUS W/ TWL XL LVL3 (GOWN DISPOSABLE) ×1 IMPLANT
GOWN STRL REUS W/TWL LRG LVL3 (GOWN DISPOSABLE) ×3
GOWN STRL REUS W/TWL XL LVL3 (GOWN DISPOSABLE) ×3
HANDLE SUCTION POOLE (INSTRUMENTS) ×1 IMPLANT
KIT BASIN OR (CUSTOM PROCEDURE TRAY) ×3 IMPLANT
KIT TURNOVER KIT B (KITS) ×3 IMPLANT
LIGASURE IMPACT 36 18CM CVD LR (INSTRUMENTS) IMPLANT
NS IRRIG 1000ML POUR BTL (IV SOLUTION) ×6 IMPLANT
PACK GENERAL/GYN (CUSTOM PROCEDURE TRAY) ×3 IMPLANT
PAD ABD 8X10 STRL (GAUZE/BANDAGES/DRESSINGS) ×4 IMPLANT
PAD ARMBOARD 7.5X6 YLW CONV (MISCELLANEOUS) ×3 IMPLANT
PENCIL SMOKE EVACUATOR (MISCELLANEOUS) ×3 IMPLANT
RELOAD PROXIMATE 75MM BLUE (ENDOMECHANICALS) ×3 IMPLANT
RELOAD STAPLE 75 3.8 BLU REG (ENDOMECHANICALS) IMPLANT
SPECIMEN JAR LARGE (MISCELLANEOUS) IMPLANT
SPONGE LAP 18X18 RF (DISPOSABLE) IMPLANT
STAPLER PROXIMATE 75MM BLUE (STAPLE) ×2 IMPLANT
STAPLER VISISTAT 35W (STAPLE) ×1 IMPLANT
SUCTION POOLE HANDLE (INSTRUMENTS) ×3
SUT PDS AB 1 TP1 54 (SUTURE) ×6 IMPLANT
SUT SILK 2 0 SH CR/8 (SUTURE) ×3 IMPLANT
SUT SILK 2 0 TIES 10X30 (SUTURE) ×3 IMPLANT
SUT SILK 3 0 SH CR/8 (SUTURE) ×3 IMPLANT
SUT SILK 3 0 TIES 10X30 (SUTURE) ×3 IMPLANT
SUT SILK 3 0SH CR/8 30 (SUTURE) ×2 IMPLANT
TAPE CLOTH SURG 6X10 WHT LF (GAUZE/BANDAGES/DRESSINGS) ×2 IMPLANT
TOWEL GREEN STERILE (TOWEL DISPOSABLE) ×3 IMPLANT
TRAY FOLEY MTR SLVR 16FR STAT (SET/KITS/TRAYS/PACK) ×3 IMPLANT
YANKAUER SUCT BULB TIP NO VENT (SUCTIONS) ×2 IMPLANT

## 2019-08-02 NOTE — H&P (Addendum)
Daniel Blackwell 02-08-1951  [id number].    Requesting MD: Dr. Reather Converse Chief Complaint/Reason for Consult: Abdominal pain/Free air on CT scan  HPI: Daniel Blackwell is a 69 y.o. male with history of hypertension, and hyperlipidemia who presented to [facility] ED for abdominal pain earlier today.  Patient reports that he had a colonoscopy on Wednesday, 2/17, by Dr. Erlene Quan in [place] where he had a 20 mm polyp removed and clipped in the cecum.  After the procedure he began having abdominal pain that was generalized but worse on the right.  His abdominal pain has become constant and worsened over the last few days.  This morning his abdominal pain became more severe so he presented to the ED.  His symptoms were worsened by bumps in the road on the way over. Nothing has made his symptoms better. He notes associated fever of 100.4.  He reports that he get screening colonoscopies every 5 years for polyps.  In the ED he is noted to be tachycardic in the 110s.  Afebrile and without hypotension.  WBC 12.5.  CT with extensive free air concerning for bowel perforation.  There is thickening of the wall of the cecum with a small amount of fluid surrounding the cecum and extending into the upper right side of the pelvis.  General surgery was asked to see.  Patient last p.o. intake was at 9 AM this morning. No prior abdominal surgeries. He is not on any blood thinners.   ROS: Review of Systems  Constitutional: Positive for fever. Negative for chills.  Respiratory: Negative for cough and shortness of breath.   Cardiovascular: Negative for chest pain.  Gastrointestinal: Positive for abdominal pain. Negative for blood in stool, constipation, diarrhea, melena, nausea and vomiting.  Genitourinary: Positive for frequency.  Psychiatric/Behavioral: Negative for substance abuse.  All other systems reviewed and are negative.   History reviewed. No pertinent family history.  Past Medical History:  Diagnosis Date  .  Allergic reaction to alpha-gal   . Allergy    mold/yeast  . Bipolar 1 disorder ([place])   . Colon polyps   . Hyperglycemia   . Hyperlipidemia   . Hypertension     Past Surgical History:  Procedure Laterality Date  . EYE SURGERY     laser eye surgery mar 2018, and april 2018    Social History:  reports that he quit smoking about 36 years ago. He has never used smokeless tobacco. He reports current alcohol use of about 1.0 standard drinks of alcohol per week. He reports that he does not use drugs.  Prior tobacco use.  Quit about 36 years ago.  No current tobacco use. Occasional alcohol use No illicit drug use Lives at home with his wife Retired.  Previously worked in the school system.  Allergies:  Allergies  Allergen Reactions  . Asa [Aspirin] Swelling  . Other Swelling    CASHEWS  . Penicillins Rash    Did it involve swelling of the face/tongue/throat, SOB, or low BP? No Did it involve sudden or severe rash/hives, skin peeling, or any reaction on the inside of your mouth or nose? No Did you need to seek medical attention at a hospital or doctor's office? No When did it last happen?50 years ago If all above answers are "NO", may proceed with cephalosporin use.   . Sulfa Antibiotics Rash    (Not in a hospital admission)    Physical Exam: Blood pressure (!) 156/87, pulse (!) 113, temperature 98.7  F (37.1 C), temperature source Oral, resp. rate 18, SpO2 97 %. General: pleasant, WD/WN white male who is laying in bed in NAD HEENT: head is normocephalic, atraumatic.  Sclera are noninjected.  PERRL.  Ears and nose without any masses or lesions.  Mouth is pink and moist. Dentition fair Heart: Tachycardic with regular rhythm.  Normal s1,s2. No obvious murmurs, gallops, or rubs noted.  Palpable pedal and radial pulses bilaterally.   Lungs: CTAB, no wheezes, rhonchi, or rales noted.  Respiratory effort nonlabored Abd: Mild distention.  Generalized tenderness with guarding  on the right.  Hypoactive bowel sounds.  No noted hernias, or organomegaly MS: no BUE/BLE edema, calves soft and nontender Skin: warm and dry with no masses, lesions, or rashes Psych: A&Ox4 with an appropriate affect Neuro: cranial nerves grossly intact, equal strength in BUE/BLE bilaterally, normal speech, though process intact   Results for orders placed or performed during the hospital encounter of 08/02/19 (from the past 48 hour(s))  Lipase, blood     Status: None   Collection Time: 08/02/19 10:55 AM  Result Value Ref Range   Lipase 21 11 - 51 U/L    Comment: Performed at [facility], [address], [place], [place] [address]  Comprehensive metabolic panel     Status: Abnormal   Collection Time: 08/02/19 10:55 AM  Result Value Ref Range   Sodium 134 (L) 135 - 145 mmol/L   Potassium 3.9 3.5 - 5.1 mmol/L   Chloride 98 98 - 111 mmol/L   CO2 24 22 - 32 mmol/L   Glucose, Bld 186 (H) 70 - 99 mg/dL   BUN 11 8 - 23 mg/dL   Creatinine, Ser 1.15 0.61 - 1.24 mg/dL   Calcium 9.1 8.9 - 10.3 mg/dL   Total Protein 7.6 6.5 - 8.1 g/dL   Albumin 3.8 3.5 - 5.0 g/dL   AST 19 15 - 41 U/L   ALT 26 0 - 44 U/L   Alkaline Phosphatase 68 38 - 126 U/L   Total Bilirubin 1.5 (H) 0.3 - 1.2 mg/dL   GFR calc non Af Amer >60 >60 mL/min   GFR calc Af Amer >60 >60 mL/min   Anion gap 12 5 - 15    Comment: Performed at [facility] [address], [place], [place] [address]  CBC     Status: Abnormal   Collection Time: 08/02/19 10:55 AM  Result Value Ref Range   WBC 12.5 (H) 4.0 - 10.5 K/uL   RBC 4.92 4.22 - 5.81 MIL/uL   Hemoglobin 15.3 13.0 - 17.0 g/dL   HCT 45.1 39.0 - 52.0 %   MCV 91.7 80.0 - 100.0 fL   MCH 31.1 26.0 - 34.0 pg   MCHC 33.9 30.0 - 36.0 g/dL   RDW 13.3 11.5 - 15.5 %   Platelets 186 150 - 400 K/uL   nRBC 0.0 0.0 - 0.2 %    Comment: Performed at [facility], [place] [address] [place], [place] [address]  Urinalysis, Routine w reflex microscopic     Status: None    Collection Time: 08/02/19  1:20 PM  Result Value Ref Range   Color, Urine YELLOW YELLOW   APPearance CLEAR CLEAR   Specific Gravity, Urine 1.018 1.005 - 1.030   pH 5.0 5.0 - 8.0   Glucose, UA NEGATIVE NEGATIVE mg/dL   Hgb urine dipstick NEGATIVE NEGATIVE   Bilirubin Urine NEGATIVE NEGATIVE   Ketones, ur NEGATIVE NEGATIVE mg/dL  Protein, ur NEGATIVE NEGATIVE mg/dL   Nitrite NEGATIVE NEGATIVE   Leukocytes,Ua NEGATIVE NEGATIVE    Comment: Performed at [facility], [place] [address] [place], [place] [address]   CT ABDOMEN PELVIS W CONTRAST  Result Date: 08/02/2019 CLINICAL DATA:  Abdominal pain after colonoscopy. EXAM: CT ABDOMEN AND PELVIS WITH CONTRAST TECHNIQUE: Multidetector CT imaging of the abdomen and pelvis was performed using the standard protocol following bolus administration of intravenous contrast. CONTRAST:  164mL OMNIPAQUE IOHEXOL 300 MG/ML  SOLN COMPARISON:  CT scan dated 02/11/2015 FINDINGS: Lower chest: Minimal atelectasis at the lung bases posteriorly, right more than left. Hepatobiliary: 9 mm solitary calcified gallstone. Gallbladder is not distended. No gallbladder wall thickening. Hepatic parenchyma is normal. No dilated bile ducts. Pancreas: Unremarkable. No pancreatic ductal dilatation or surrounding inflammatory changes. Spleen: Normal in size without focal abnormality. Adrenals/Urinary Tract: Adrenal glands are normal. 11 mm cyst in the mid right kidney. Exophytic 22 mm cyst in the mid left kidney. Adjacent 8 mm cyst in the mid left kidney. No hydronephrosis. Bladder is normal. Stomach/Bowel: There is extensive free air in the abdomen. There is slight thickening of the wall of the cecum. There is a 15 mm long metallic device in the cecal lumen which may be a marker for previous polypectomy. There is a small amount of fluid surrounding the cecum with a small amount of fluid extending around the adjacent mesentery and into the upper right side of the pelvis. There is a  small radiodensity in the posterior wall of the ascending colon, 4 mm in size, etiology indeterminate. The colon is not distended. Stomach appears normal. Minimally distended small bowel loops in the lower abdomen. Terminal ileum appears normal. Vascular/Lymphatic: Aortic atherosclerosis. No enlarged abdominal or pelvic lymph nodes. Reproductive: Prostate is unremarkable. Other: No abdominal wall hernias. Musculoskeletal: No acute abnormalities. 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 IMPRESSION: 1. Extensive free air in the abdomen consistent with bowel perforation. 2. Slight thickening of the wall of the cecum with a small amount of fluid surrounding the cecum and extending into the upper right side of the pelvis. I suspect that the cecum is the site of perforation. There is a 15 mm linear metallic device in the cecum. Did the patient have a clip placed in the cecum at the time of colonoscopy? 3. 4 mm radiodensity in the posterior wall of the ascending colon, etiology indeterminate. 4. Cholelithiasis. 5. Critical Value/emergent results were called by telephone at the time of interpretation on 08/02/2019 at 1:39 pm to provider [facility] , who verbally acknowledged these results. Aortic Atherosclerosis (ICD10-I70.0). Electronically Signed   By: Lorriane Shire M.D.   On: 08/02/2019 13:39   Anti-infectives (From admission, onward)   Start     Dose/Rate Route Frequency Ordered Stop   08/02/19 1400  ciprofloxacin (CIPRO) IVPB 400 mg     400 mg 200 mL/hr over 60 Minutes Intravenous  Once 08/02/19 1347 08/02/19 1512   08/02/19 1400  metroNIDAZOLE (FLAGYL) IVPB 500 mg     500 mg 100 mL/hr over 60 Minutes Intravenous  Once 08/02/19 1347        Assessment/Plan HTN HLD  Right sided abdominal pain Free air concerning for bowel perforation Peritonitis  -Patient CT with extensive free air concerning for bowel perforation.  Suspect this is in the cecum where a polyp was removed and  clipped during colonoscopy.  Will plan for ex lap emergently with Dr. Rosendo Gros.  Risks and benefits were  discussed with the patient.  Patient was made aware of possible colostomy.  - Admit to inpatient   FEN - NPO for OR VTE - SCDs ID - Cipro/Flagyl in ED  Jillyn Ledger, [facility] 08/02/2019, 3:17 PM Please see Amion for pager number during day hours 7:00am-4:30pm

## 2019-08-02 NOTE — Op Note (Signed)
08/02/2019  5:00 PM  PATIENT:  Daniel Blackwell  69 y.o. male  PRE-OPERATIVE DIAGNOSIS: Perforated viscus  POST-OPERATIVE DIAGNOSIS: Feculent peritonitis, perforated cecum polypectomy  PROCEDURE:  Procedure(s): EXPLORATORY LAPAROTOMY, ILEOCECTOMY (N/A)  SURGEON:  Surgeon(s) and Role:    Ralene Ok, MD - Primary  PHYSICIAN ASSISTANT: Alferd Apa, PA-C, who was essential for help with retraction, mobilization and dissection of the anatomy.  ANESTHESIA:   local and general  EBL:  25cc  BLOOD ADMINISTERED:none  DRAINS: none   LOCAL MEDICATIONS USED:  NONE  SPECIMEN:  Source of Specimen:   Ileocectomy  DISPOSITION OF SPECIMEN:  PATHOLOGY  COUNTS:  YES  TOURNIQUET:  * No tourniquets in log *  DICTATION: .Dragon Dictation Indication for procedure Patient is a 70 M with a h/o colonoscopy 2 days ago and underwent polypectomy.  Patient patient began having pain the night of colonoscopy.  He states that the pain continued over the next 2 days.  He states that secondary to pain he presented to the ER.  Upon evaluation the ER patient underwent CT scan which revealed large amount of free air likely origin being the cecum.  Patient was taken back to the operating room emergently for ex lap.  Findings: Patient had a punctate perforation at the lateral wall of the cecum.  There is large amount of feculent peritonitis.  Patient underwent ileocolectomy with a 75 GIA blue load stapler.  This was oversewed with interrupted 3 oh silks.  Details of procedure: After the patient was consented he was taken back to the OR and placed in the supine position with bilateral SCDs in place.  Underwent general trach intubation.  A Foley cath was placed.  Patient was then prepped and draped in standard fashion.  A timeout was called and all facts verified.  10.  Blade was used to make a midline incision.  Cautery was used to maintain hemostasis and dissection was taken down to the midline fascia.   This was elevated between 2 Kocher clamps.  At this time the peritoneum was entered bluntly.  There is large amount of air within the abdominal cavity.  At this time the fascia was then incised and like the skin incision.  At this time it was evident there was large amount of feculent peritonitis within the abdominal cavity.  A Balfour abdominal wall retractor was in place.  I then proceeded to mobilize the small bowel away from the cecum.  It was apparent that there was some loose adhesions to the right lower quadrant area.  The small bowel was eviscerated.  Fecal contamination could be seen coming from the cecum.  At this time the white line of [place] was incised and the right colon was mobilized medially.  At this time visualizing the cecum it was evident that there was a lateral wall cecal perforation that was punctate, less than 0.5 cm.  At this time this was stitched together to help prevent further contamination with 3 oh silks.  Once the right colon was fully mobilized medially.  I proceeded with an ileocecectomy using a 75 GIA blue load stapler.  This required 2 firings.  At this time the staple line was oversewed using 3 oh silks in interrupted fashion.  At this time the abdominal cavity was irrigated out with multiple liters of sterile saline to the effluent was clear.  This time the omentum was brought over the midline the fascia was then reapproximated using #1 PDS in a single stranded running fashion.  The skin was left open.  This was packed with saline soaked Kerlix.  The patient tired procedure well was taken to the recovery in stable condition.   PLAN OF CARE: Admit to inpatient   PATIENT DISPOSITION:  PACU - hemodynamically stable.   Delay start of Pharmacological VTE agent (>24hrs) due to surgical blood loss or risk of bleeding: no

## 2019-08-02 NOTE — ED Provider Notes (Signed)
[facility] EMERGENCY DEPARTMENT Provider Note   CSN: [id number] Arrival date & time: 08/02/19  1007     History Chief Complaint  Patient presents with  . Abdominal Pain    Daniel Blackwell is a 69 y.o. male.  Patient with history of bipolar, high blood pressure presents with worsening abdominal pain since having a colonoscopy on Wednesday 2 days prior.  Patient denies any gross bleeding.  Initially the pain was mild in the right lower quadrant and now it is more severe central epigastric as well as left lower.  Patient did have a few episodes of feeling diaphoretic no fever or cough.  Patient's had decreased bowel movements over the past 24 hours and did not pass gas today.  No history of bowel obstruction or abdominal pelvic surgeries.  Patient had Covid vaccine second round completed day before colonoscopy.  Patient had a polyp that was removed 2 cm and cauterized in the cecum.        Past Medical History:  Diagnosis Date  . Allergic reaction to alpha-gal   . Allergy    mold/yeast  . Bipolar 1 disorder ([place])   . Colon polyps   . Hyperglycemia   . Hyperlipidemia   . Hypertension     Patient Active Problem List   Diagnosis Date Noted  . Allergic reaction to alpha-gal   . Hyperlipidemia   . Hyperglycemia   . Multiple allergies 12/01/2010  . Bipolar 1 disorder ([place]) 12/01/2010  . Hypertension 12/01/2010  . Colon polyps 12/01/2010    Past Surgical History:  Procedure Laterality Date  . EYE SURGERY     laser eye surgery mar 2018, and april 2018       No family history on file.  Social History   Tobacco Use  . Smoking status: Former Smoker    Quit date: 04/04/1983    Years since quitting: 36.3  . Smokeless tobacco: Never Used  Substance Use Topics  . Alcohol use: Yes    Alcohol/week: 1.0 standard drinks    Types: 1 drink(s) per week  . Drug use: No    Home Medications Prior to Admission medications   Medication Sig Start Date End  Date Taking? Authorizing Provider  allopurinol (ZYLOPRIM) 300 MG tablet TAKE 1 TABLET(300 MG) BY MOUTH DAILY Patient taking differently: Take 300 mg by mouth daily.  05/03/19  Yes Susy Frizzle, MD  amLODipine (NORVASC) 5 MG tablet Take 1 tablet (5 mg total) by mouth daily. 05/14/19 05/13/20 Yes Susy Frizzle, MD  aspirin 81 MG tablet Take 81 mg by mouth daily.   Yes [provider]  colchicine 0.6 MG tablet TAKE 1 TABLET(0.6 MG) BY MOUTH DAILY Patient taking differently: Take 0.6 mg by mouth daily as needed (gout attack).  01/25/19  Yes Susy Frizzle, MD  EPINEPHrine 0.3 mg/0.3 mL IJ SOAJ injection Inject 0.3 mLs (0.3 mg total) into the muscle as needed for anaphylaxis. 07/19/19  Yes Susy Frizzle, MD  famotidine (PEPCID) 40 MG tablet Take 1 tablet (40 mg total) by mouth daily. 07/19/19  Yes Susy Frizzle, MD  indomethacin (INDOCIN) 50 MG capsule Take 1 capsule (50 mg total) by mouth 3 (three) times daily with meals. Patient taking differently: Take 50 mg by mouth daily as needed for mild pain.  09/17/18  Yes Susy Frizzle, MD  levocetirizine (XYZAL) 5 MG tablet Take 1 tablet (5 mg total) by mouth every evening. 07/19/19  Yes Susy Frizzle, MD  lisinopril (ZESTRIL) 20 MG tablet TAKE 1 TABLET(20 MG) BY MOUTH DAILY Patient taking differently: Take 20 mg by mouth daily.  12/12/18  Yes Susy Frizzle, MD  Multiple Vitamin (MULTIVITAMIN) tablet Take 1 tablet by mouth daily.   Yes [provider]  Omega-3 Fatty Acids (FISH OIL) 1000 MG CPDR Take 2 capsules by mouth See admin instructions. 2-3 times weekly   Yes [provider]  vitamin B-12 (CYANOCOBALAMIN) 1000 MCG tablet Take 1,000 mcg by mouth daily.    Yes [provider]    Allergies    Asa [aspirin], Other, Penicillins, and Sulfa antibiotics  Review of Systems   Review of Systems  Constitutional: Positive for appetite change. Negative for chills and fever.  HENT: Negative for congestion.    Eyes: Negative for visual disturbance.  Respiratory: Negative for shortness of breath.   Cardiovascular: Negative for chest pain.  Gastrointestinal: Positive for abdominal pain. Negative for vomiting.  Genitourinary: Negative for dysuria and flank pain.  Musculoskeletal: Negative for back pain, neck pain and neck stiffness.  Skin: Negative for rash.  Neurological: Negative for light-headedness and headaches.    Physical Exam Updated Vital Signs BP (!) 156/87   Pulse (!) 113   Temp 98.7 F (37.1 C) (Oral)   Resp 18   SpO2 97%   Physical Exam Vitals and nursing note reviewed.  Constitutional:      Appearance: He is well-developed.  HENT:     Head: Normocephalic and atraumatic.  Eyes:     General:        Right eye: No discharge.        Left eye: No discharge.     Conjunctiva/sclera: Conjunctivae normal.  Neck:     Trachea: No tracheal deviation.  Cardiovascular:     Rate and Rhythm: Normal rate and regular rhythm.  Pulmonary:     Effort: Pulmonary effort is normal.     Breath sounds: Normal breath sounds.  Abdominal:     General: There is no distension.     Palpations: Abdomen is soft.     Tenderness: There is generalized abdominal tenderness. There is guarding.  Musculoskeletal:     Cervical back: Normal range of motion and neck supple.  Skin:    General: Skin is warm.     Findings: No rash.  Neurological:     Mental Status: He is alert and oriented to person, place, and time.     ED Results / Procedures / Treatments   Labs (all labs ordered are listed, but only abnormal results are displayed) Labs Reviewed  COMPREHENSIVE METABOLIC PANEL - Abnormal; Notable for the following components:      Result Value   Sodium 134 (*)    Glucose, Bld 186 (*)    Total Bilirubin 1.5 (*)    All other components within normal limits  CBC - Abnormal; Notable for the following components:   WBC 12.5 (*)    All other components within normal limits  RESPIRATORY PANEL BY RT  PCR (FLU A&B, COVID)  LIPASE, BLOOD  URINALYSIS, ROUTINE W REFLEX MICROSCOPIC    EKG EKG Interpretation  Date/Time:  Friday August 02 2019 10:53:36 EST Ventricular Rate:  110 PR Interval:    QRS Duration: 106 QT Interval:  339 QTC Calculation: 459 R Axis:   -47 Text Interpretation: Sinus tachycardia Incomplete RBBB and LAFB Abnormal R-wave progression, late transition Confirmed by Elnora Morrison [phone]) on 08/02/2019 10:58:11 AM   Rad[facility] [place]  Result Date: 08/02/2019 CLINICAL DATA:  Abdominal pain after colonoscopy. EXAM: CT ABDOMEN AND PELVIS WITH CONTRAST TECHNIQUE: Multidetector CT imaging of the abdomen and pelvis was performed using the standard protocol following bolus administration of intravenous contrast. CONTRAST:  136mL OMNIPAQUE IOHEXOL 300 MG/ML  SOLN COMPARISON:  CT scan dated 02/11/2015 FINDINGS: Lower chest: Minimal atelectasis at the lung bases posteriorly, right more than left. Hepatobiliary: 9 mm solitary calcified gallstone. Gallbladder is not distended. No gallbladder wall thickening. Hepatic parenchyma is normal. No dilated bile ducts. Pancreas: Unremarkable. No pancreatic ductal dilatation or surrounding inflammatory changes. Spleen: Normal in size without focal abnormality. Adrenals/Urinary Tract: Adrenal glands are normal. 11 mm cyst in the mid right kidney. Exophytic 22 mm cyst in the mid left kidney. Adjacent 8 mm cyst in the mid left kidney. No hydronephrosis. Bladder is normal. Stomach/Bowel: There is extensive free air in the abdomen. There is slight thickening of the wall of the cecum. There is a 15 mm long metallic device in the cecal lumen which may be a marker for previous polypectomy. There is a small amount of fluid surrounding the cecum with a small amount of fluid extending around the adjacent mesentery and into the upper right side of the pelvis. There is a small radiodensity in the posterior wall of the ascending colon, 4 mm in  size, etiology indeterminate. The colon is not distended. Stomach appears normal. Minimally distended small bowel loops in the lower abdomen. Terminal ileum appears normal. Vascular/Lymphatic: Aortic atherosclerosis. No enlarged abdominal or pelvic lymph nodes. Reproductive: Prostate is unremarkable. Other: No abdominal wall hernias. Musculoskeletal: No acute abnormalities. 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 IMPRESSION: 1. Extensive free air in the abdomen consistent with bowel perforation. 2. Slight thickening of the wall of the cecum with a small amount of fluid surrounding the cecum and extending into the upper right side of the pelvis. I suspect that the cecum is the site of perforation. There is a 15 mm linear metallic device in the cecum. Did the patient have a clip placed in the cecum at the time of colonoscopy? 3. 4 mm radiodensity in the posterior wall of the ascending colon, etiology indeterminate. 4. Cholelithiasis. 5. Critical Value/emergent results were called by telephone at the time of interpretation on 08/02/2019 at 1:39 pm to provider [facility] , who verbally acknowledged these results. Aortic Atherosclerosis (ICD10-I70.0). Electronically Signed   By: Lorriane Shire M.D.   On: 08/02/2019 13:39    Procedures .Critical Care Performed by: Elnora Morrison, MD Authorized by: Elnora Morrison, MD   Critical care provider statement:    Critical care time (minutes):  75   Critical care start time:  08/02/2019 1:00 PM   Critical care end time:  08/02/2019 2:15 PM   Critical care time was exclusive of:  Separately billable procedures and treating other patients and teaching time   Critical care was time spent personally by me on the following activities:  Discussions with consultants, evaluation of patient's response to treatment, examination of patient, ordering and performing treatments and interventions, ordering and review of laboratory studies, ordering and review  of radiographic studies, pulse oximetry, re-evaluation of patient's condition, obtaining history from patient or surrogate and review of old charts Comments:     Colon perforation   (including critical care time)  Medications Ordered in ED Medications  fentaNYL (SUBLIMAZE) injection 50 mcg (50 mcg Intravenous Given 08/02/19 1118)  ciprofloxacin (CIPRO) IVPB 400 mg (400 mg Intravenous New Bag/Given 08/02/19 1409)  metroNIDAZOLE (FLAGYL) IVPB 500 mg (has no administration in time range)  sodium chloride 0.9 % bolus 500 mL (has no administration in time range)  sodium chloride 0.9 % bolus 500 mL (0 mLs Intravenous Stopped 08/02/19 1215)  iohexol (OMNIPAQUE) 300 MG/ML solution 100 mL (100 mLs Intravenous Contrast Given 08/02/19 1317)    ED Course  I have reviewed the triage vital signs and the nursing notes.  Pertinent labs & imaging results that were available during my care of the patient were reviewed by me and considered in my medical decision making (see chart for details).    MDM Rules/Calculators/A&P                     Patient presents with worsening abdominal pain and decreased bowel movement since colonoscopy on Wednesday.  Discussed differential including possibility of perforation/ileus/other.  Blood work ordered and reviewed hemoglobin normal, white blood cell count mild elevated 12.5.  Kidney function normal.  CT scan pending.  Pain meds given.  IV fluids given.  CT scan results ordered and reviewed with radiology consistent with perforation small amount of fluid and free air.  IV antibiotics ordered.  Paged general surgery.  Updated patient on plan.  IV antibiotics running, repeat pain meds and IV fluids ordered.  Discussed with surgery for admission/possible surgery and they will evaluate the patient. COVID pending. The patients results and plan were reviewed and discussed.   Any x-rays performed were independently reviewed by myself.   Differential diagnosis were considered  with the presenting HPI.  Medications  fentaNYL (SUBLIMAZE) injection 50 mcg (50 mcg Intravenous Given 08/02/19 1118)  ciprofloxacin (CIPRO) IVPB 400 mg (400 mg Intravenous New Bag/Given 08/02/19 1409)  metroNIDAZOLE (FLAGYL) IVPB 500 mg (has no administration in time range)  sodium chloride 0.9 % bolus 500 mL (has no administration in time range)  sodium chloride 0.9 % bolus 500 mL (0 mLs Intravenous Stopped 08/02/19 1215)  iohexol (OMNIPAQUE) 300 MG/ML solution 100 mL (100 mLs Intravenous Contrast Given 08/02/19 1317)    Vitals:   08/02/19 1200 08/02/19 1230 08/02/19 1245 08/02/19 1348  BP: 135/78 (!) 146/85 (!) 143/96 (!) 156/87  Pulse: 92 100 (!) 106 (!) 113  Resp: 18 18 20 18   Temp:      TempSrc:      SpO2: 96% 98% 98% 97%    Final diagnoses:  Abdominal pain, generalized  Colon perforation ([place])    Admission/ observation were discussed with the admitting physician, patient and/or family and they are comfortable with the plan. \  Final Clinical Impression(s) / ED Diagnoses Final diagnoses:  Abdominal pain, generalized  Colon perforation [facility])    Rx / DC Orders ED Discharge Orders    None       Elnora Morrison, MD 08/02/19 1448

## 2019-08-02 NOTE — Anesthesia Preprocedure Evaluation (Signed)
Anesthesia Evaluation  Patient identified by MRN, date of birth, ID band Patient awake    Reviewed: Allergy & Precautions, NPO status , Patient's Chart, lab work & pertinent test results  Airway Mallampati: II  TM Distance: >3 FB Neck ROM: Full    Dental no notable dental hx.    Pulmonary neg pulmonary ROS, former smoker,    Pulmonary exam normal breath sounds clear to auscultation       Cardiovascular hypertension, Pt. on medications negative cardio ROS Normal cardiovascular exam Rhythm:Regular Rate:Normal     Neuro/Psych Bipolar Disorder negative neurological ROS  negative psych ROS   GI/Hepatic negative GI ROS, Neg liver ROS,   Endo/Other  negative endocrine ROS  Renal/GU negative Renal ROS  negative genitourinary   Musculoskeletal negative musculoskeletal ROS (+)   Abdominal   Peds negative pediatric ROS (+)  Hematology negative hematology ROS (+)   Anesthesia Other Findings Free air in abdomen  Reproductive/Obstetrics negative OB ROS                             Anesthesia Physical Anesthesia Plan  ASA: II and emergent  Anesthesia Plan: General   Post-op Pain Management:    Induction: Intravenous, Rapid sequence and Cricoid pressure planned  PONV Risk Score and Plan: 2 and Ondansetron, Midazolam and Treatment may vary due to age or medical condition  Airway Management Planned: Oral ETT  Additional Equipment:   Intra-op Plan:   Post-operative Plan: Extubation in OR  Informed Consent: I have reviewed the patients History and Physical, chart, labs and discussed the procedure including the risks, benefits and alternatives for the proposed anesthesia with the patient or authorized representative who has indicated his/her understanding and acceptance.     Dental advisory given  Plan Discussed with: CRNA  Anesthesia Plan Comments:         Anesthesia Quick  Evaluation

## 2019-08-02 NOTE — Transfer of Care (Signed)
Immediate Anesthesia Transfer of Care Note  Patient: Daniel Blackwell  Procedure(s) Performed: EXPLORATORY LAPAROTOMY, ILEOCECTOMY (N/A )  Patient Location: PACU  Anesthesia Type:General  Level of Consciousness: awake, alert , oriented and patient cooperative  Airway & Oxygen Therapy: Patient Spontanous Breathing and Patient connected to face mask oxygen  Post-op Assessment: Report given to RN and Post -op Vital signs reviewed and stable  Post vital signs: Reviewed and stable  Last Vitals:  Vitals Value Taken Time  BP 150/76 08/02/19 1717  Temp    Pulse 95 08/02/19 1717  Resp 24 08/02/19 1721  SpO2 88 % 08/02/19 1717  Vitals shown include unvalidated device data.  Last Pain:  Vitals:   08/02/19 1345  TempSrc:   PainSc: 3          Complications: No apparent anesthesia complications

## 2019-08-02 NOTE — ED Notes (Signed)
Daniel [phone] like pt updates.

## 2019-08-02 NOTE — ED Triage Notes (Signed)
Pt reports RLQ pain since having a colonoscopy 2 days ago. Pt denies any rectal bleeding or n/v. States he is unable to button his pants today and he can normally button them without difficulty. Pt arrives a/o , ambulatory.

## 2019-08-03 LAB — BASIC METABOLIC PANEL
Anion gap: 11 (ref 5–15)
BUN: 12 mg/dL (ref 8–23)
CO2: 24 mmol/L (ref 22–32)
Calcium: 8 mg/dL — ABNORMAL LOW (ref 8.9–10.3)
Chloride: 100 mmol/L (ref 98–111)
Creatinine, Ser: 1.24 mg/dL (ref 0.61–1.24)
GFR calc Af Amer: >60 mL/min (ref >60)
GFR calc non Af Amer: 59 mL/min — ABNORMAL LOW (ref >60)
Glucose, Bld: 153 mg/dL — ABNORMAL HIGH (ref 70–99)
Potassium: 4.3 mmol/L (ref 3.5–5.1)
Sodium: 135 mmol/L (ref 135–145)

## 2019-08-03 LAB — CBC
HCT: 39.4 % (ref 39.0–52.0)
Hemoglobin: 13.5 g/dL (ref 13.0–17.0)
MCH: 31.1 pg (ref 26.0–34.0)
MCHC: 34.3 g/dL (ref 30.0–36.0)
MCV: 90.8 fL (ref 80.0–100.0)
Platelets: 152 10*3/uL (ref 150–400)
RBC: 4.34 MIL/uL (ref 4.22–5.81)
RDW: 13.4 % (ref 11.5–15.5)
WBC: 11.2 10*3/uL — ABNORMAL HIGH (ref 4.0–10.5)
nRBC: 0 % (ref 0.0–0.2)

## 2019-08-03 NOTE — Anesthesia Postprocedure Evaluation (Signed)
Anesthesia Post Note  Patient: Daniel Blackwell  Procedure(s) Performed: EXPLORATORY LAPAROTOMY, ILEOCECTOMY (N/A )     Patient location during evaluation: PACU Anesthesia Type: General Level of consciousness: awake and alert Pain management: pain level controlled Vital Signs Assessment: post-procedure vital signs reviewed and stable Respiratory status: spontaneous breathing, nonlabored ventilation and respiratory function stable Cardiovascular status: blood pressure returned to baseline and stable Postop Assessment: no apparent nausea or vomiting Anesthetic complications: no    Last Vitals:  Vitals:   08/03/19 0155 08/03/19 0544  BP: 112/68 121/73  Pulse: 73 67  Resp: 17 20  Temp: 36.5 C 37 C  SpO2: 96% 96%    Last Pain:  Vitals:   08/03/19 0544  TempSrc: Oral  PainSc: 2                  Lynda Rainwater

## 2019-08-03 NOTE — Progress Notes (Signed)
Patient ID: Daniel Blackwell, male   DOB: 1950-07-31, 69 y.o.   MRN: [id number] [facility] Progress Note:   1 Day Post-Op  Subjective: Mental status is alert and oriented.  Path on cecal polyp was benign.   Objective: Vital signs in last 24 hours: Temp:  [97.7 F (36.5 C)-101.2 F (38.4 C)] 98.6 F (37 C) (02/20 0544) Pulse Rate:  [67-126] 67 (02/20 0544) Resp:  [17-22] 20 (02/20 0544) BP: (102-156)/(57-97) 121/73 (02/20 0544) SpO2:  [93 %-100 %] 96 % (02/20 0544) Weight:  [105.3 kg] 105.3 kg (02/20 0500)  Intake/Output from previous day: 02/19 0701 - 02/20 0700 In: 2328.5 [P.O.:240; I.V.:1738.5; IV Piggyback:350] Out: [id number] [Urine:1575; Blood:100] Intake/Output this shift: No intake/output data recorded.  Physical Exam: Work of breathing is not labored.  Wound covered.  Will begin BID wet to dry.  No flatus but taking clears  Lab Results:  Results for orders placed or performed during the hospital encounter of 08/02/19 (from the past 48 hour(s))  Lipase, blood     Status: None   Collection Time: 08/02/19 10:55 AM  Result Value Ref Range   Lipase 21 11 - 51 U/L    Comment: Performed at [facility], [place] [address] [place], [place] [address]  Comprehensive metabolic panel     Status: Abnormal   Collection Time: 08/02/19 10:55 AM  Result Value Ref Range   Sodium 134 (L) 135 - 145 mmol/L   Potassium 3.9 3.5 - 5.1 mmol/L   Chloride 98 98 - 111 mmol/L   CO2 24 22 - 32 mmol/L   Glucose, Bld 186 (H) 70 - 99 mg/dL   BUN 11 8 - 23 mg/dL   Creatinine, Ser 1.15 0.61 - 1.24 mg/dL   Calcium 9.1 8.9 - 10.3 mg/dL   Total Protein 7.6 6.5 - 8.1 g/dL   Albumin 3.8 3.5 - 5.0 g/dL   AST 19 15 - 41 U/L   ALT 26 0 - 44 U/L   Alkaline Phosphatase 68 38 - 126 U/L   Total Bilirubin 1.5 (H) 0.3 - 1.2 mg/dL   GFR calc non Af Amer >60 >60 mL/min   GFR calc Af Amer >60 >60 mL/min   Anion gap 12 5 - 15    Comment: Performed at [facility] [address],  [place], [place] [address]  CBC     Status: Abnormal   Collection Time: 08/02/19 10:55 AM  Result Value Ref Range   WBC 12.5 (H) 4.0 - 10.5 K/uL   RBC 4.92 4.22 - 5.81 MIL/uL   Hemoglobin 15.3 13.0 - 17.0 g/dL   HCT 45.1 39.0 - 52.0 %   MCV 91.7 80.0 - 100.0 fL   MCH 31.1 26.0 - 34.0 pg   MCHC 33.9 30.0 - 36.0 g/dL   RDW 13.3 11.5 - 15.5 %   Platelets 186 150 - 400 K/uL   nRBC 0.0 0.0 - 0.2 %    Comment: Performed at [facility], [place] [address] [place], [place] [address]  Urinalysis, Routine w reflex microscopic     Status: None   Collection Time: 08/02/19  1:20 PM  Result Value Ref Range   Color, Urine YELLOW YELLOW   APPearance CLEAR CLEAR   Specific Gravity, Urine 1.018 1.005 - 1.030   pH 5.0 5.0 - 8.0   Glucose, UA NEGATIVE NEGATIVE mg/dL   Hgb urine dipstick NEGATIVE NEGATIVE   Bilirubin Urine NEGATIVE NEGATIVE   Ketones, ur  NEGATIVE NEGATIVE mg/dL   Protein, ur NEGATIVE NEGATIVE mg/dL   Nitrite NEGATIVE NEGATIVE   Leukocytes,Ua NEGATIVE NEGATIVE    Comment: Performed at [facility] [address] [place], [place] [address]  Respiratory Panel by RT PCR (Flu A&B, Covid) - Nasopharyngeal Swab     Status: None   Collection Time: 08/02/19  3:15 PM   Specimen: Nasopharyngeal Swab  Result Value Ref Range   SARS Coronavirus 2 by RT PCR NEGATIVE NEGATIVE    Comment: (NOTE) SARS-CoV-2 target nucleic acids are NOT DETECTED. The SARS-CoV-2 RNA is generally detectable in upper respiratoy specimens during the acute phase of infection. The lowest concentration of SARS-CoV-2 viral copies this assay can detect is 131 copies/mL. A negative result does not preclude SARS-Cov-2 infection and should not be used as the sole basis for treatment or other patient management decisions. A negative result may occur with  improper specimen collection/handling, submission of specimen other than nasopharyngeal swab, presence of viral mutation(s) within the areas targeted by this assay,  and inadequate number of viral copies (<131 copies/mL). A negative result must be combined with clinical observations, patient history, and epidemiological information. The expected result is Negative. Fact Sheet for Patients:  PinkCheek.be Fact Sheet for Healthcare Providers:  GravelBags.it This test is not yet ap proved or cleared by the [place] FDA and  has been authorized for detection and/or diagnosis of SARS-CoV-2 by FDA under an Emergency Use Authorization (EUA). This EUA will remain  in effect (meaning this test can be used) for the duration of the COVID-19 declaration under Section 564(b)(1) of the Act, 21 U.S.C. section 360bbb-3(b)(1), unless the authorization is terminated or revoked sooner.    Influenza A by PCR NEGATIVE NEGATIVE   Influenza B by PCR NEGATIVE NEGATIVE    Comment: (NOTE) The Xpert Xpress SARS-CoV-2/FLU/RSV assay is intended as an aid in  the diagnosis of influenza from Nasopharyngeal swab specimens and  should not be used as a sole basis for treatment. Nasal washings and  aspirates are unacceptable for Xpert Xpress SARS-CoV-2/FLU/RSV  testing. Fact Sheet for Patients: PinkCheek.be Fact Sheet for Healthcare Providers: GravelBags.it This test is not yet approved or cleared by the [place] FDA and  has been authorized for detection and/or diagnosis of SARS-CoV-2 by  FDA under an Emergency Use Authorization (EUA). This EUA will remain  in effect (meaning this test can be used) for the duration of the  Covid-19 declaration under Section 564(b)(1) of the Act, 21  U.S.C. section 360bbb-3(b)(1), unless the authorization is  terminated or revoked. Performed at [facility], [place] [address] [place], [place] [address]   Sample to Blood Bank     Status: None   Collection Time: 08/02/19  6:07 PM  Result Value Ref Range   Blood Bank  Specimen SAMPLE AVAILABLE FOR TESTING    Sample Expiration      08/05/2019,2359 Performed at [facility], [place] [address] [place], [place] [id number]   Basic metabolic panel     Status: Abnormal   Collection Time: 08/03/19  2:40 AM  Result Value Ref Range   Sodium 135 135 - 145 mmol/L   Potassium 4.3 3.5 - 5.1 mmol/L   Chloride 100 98 - 111 mmol/L   CO2 24 22 - 32 mmol/L   Glucose, Bld 153 (H) 70 - 99 mg/dL   BUN 12 8 - 23 mg/dL   Creatinine, Ser 1.24 0.61 - 1.24 mg/dL   Calcium 8.0 (L)  8.9 - 10.3 mg/dL   GFR calc non Af Amer 59 (L) >60 mL/min   GFR calc Af Amer >60 >60 mL/min   Anion gap 11 5 - 15    Comment: Performed at [facility] [address] [place], [place] [address]  CBC     Status: Abnormal   Collection Time: 08/03/19  2:40 AM  Result Value Ref Range   WBC 11.2 (H) 4.0 - 10.5 K/uL   RBC 4.34 4.22 - 5.81 MIL/uL   Hemoglobin 13.5 13.0 - 17.0 g/dL   HCT 39.4 39.0 - 52.0 %   MCV 90.8 80.0 - 100.0 fL   MCH 31.1 26.0 - 34.0 pg   MCHC 34.3 30.0 - 36.0 g/dL   RDW 13.4 11.5 - 15.5 %   Platelets 152 150 - 400 K/uL   nRBC 0.0 0.0 - 0.2 %    Comment: Performed at [facility], [place] [address], [place], [place] [address]    Radiology/Results: CT ABDOMEN PELVIS W CONTRAST  Result Date: 08/02/2019 CLINICAL DATA:  Abdominal pain after colonoscopy. EXAM: CT ABDOMEN AND PELVIS WITH CONTRAST TECHNIQUE: Multidetector CT imaging of the abdomen and pelvis was performed using the standard protocol following bolus administration of intravenous contrast. CONTRAST:  165mL OMNIPAQUE IOHEXOL 300 MG/ML  SOLN COMPARISON:  CT scan dated 02/11/2015 FINDINGS: Lower chest: Minimal atelectasis at the lung bases posteriorly, right more than left. Hepatobiliary: 9 mm solitary calcified gallstone. Gallbladder is not distended. No gallbladder wall thickening. Hepatic parenchyma is normal. No dilated bile ducts. Pancreas: Unremarkable. No pancreatic ductal dilatation or surrounding  inflammatory changes. Spleen: Normal in size without focal abnormality. Adrenals/Urinary Tract: Adrenal glands are normal. 11 mm cyst in the mid right kidney. Exophytic 22 mm cyst in the mid left kidney. Adjacent 8 mm cyst in the mid left kidney. No hydronephrosis. Bladder is normal. Stomach/Bowel: There is extensive free air in the abdomen. There is slight thickening of the wall of the cecum. There is a 15 mm long metallic device in the cecal lumen which may be a marker for previous polypectomy. There is a small amount of fluid surrounding the cecum with a small amount of fluid extending around the adjacent mesentery and into the upper right side of the pelvis. There is a small radiodensity in the posterior wall of the ascending colon, 4 mm in size, etiology indeterminate. The colon is not distended. Stomach appears normal. Minimally distended small bowel loops in the lower abdomen. Terminal ileum appears normal. Vascular/Lymphatic: Aortic atherosclerosis. No enlarged abdominal or pelvic lymph nodes. Reproductive: Prostate is unremarkable. Other: No abdominal wall hernias. Musculoskeletal: No acute abnormalities. 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 IMPRESSION: 1. Extensive free air in the abdomen consistent with bowel perforation. 2. Slight thickening of the wall of the cecum with a small amount of fluid surrounding the cecum and extending into the upper right side of the pelvis. I suspect that the cecum is the site of perforation. There is a 15 mm linear metallic device in the cecum. Did the patient have a clip placed in the cecum at the time of colonoscopy? 3. 4 mm radiodensity in the posterior wall of the ascending colon, etiology indeterminate. 4. Cholelithiasis. 5. Critical Value/emergent results were called by telephone at the time of interpretation on 08/02/2019 at 1:39 pm to provider [facility] , who verbally acknowledged these results. Aortic Atherosclerosis (ICD10-I70.0).  Electronically Signed   By: Lorriane Shire M.D.   On:  08/02/2019 13:39    Anti-infectives: Anti-infectives (From admission, onward)   Start     Dose/Rate Route Frequency Ordered Stop   08/02/19 2200  piperacillin-tazobactam (ZOSYN) IVPB 3.375 g     3.375 g 12.5 mL/hr over 240 Minutes Intravenous Every 8 hours 08/02/19 1823 08/07/19 2159   08/02/19 1400  ciprofloxacin (CIPRO) IVPB 400 mg     400 mg 200 mL/hr over 60 Minutes Intravenous  Once 08/02/19 1347 08/02/19 1512   08/02/19 1400  metroNIDAZOLE (FLAGYL) IVPB 500 mg     500 mg 100 mL/hr over 60 Minutes Intravenous  Once 08/02/19 1347 08/02/19 1618      Assessment/Plan: Problem List: Patient Active Problem List   Diagnosis Date Noted  . Intra-abdominal free air of unknown etiology 08/02/2019  . Allergic reaction to alpha-gal   . Hyperlipidemia   . Hyperglycemia   . Multiple allergies 12/01/2010  . Bipolar 1 disorder ([place]) 12/01/2010  . Hypertension 12/01/2010  . Colon polyps 12/01/2010    Continue clear liquids PO.   1 Day Post-Op    LOS: 1 day   Matt B. Hassell Done, MD, [facility] [phone] beeper [phone]  08/03/2019 9:02 AM

## 2019-08-04 NOTE — Progress Notes (Signed)
Patient ID: Daniel Blackwell, male   DOB: 28-Aug-1950, 69 y.o.   MRN: [id number] [facility] Progress Note:   2 Days Post-Op  Subjective: Mental status is alert with towel over head.   Objective: Vital signs in last 24 hours: Temp:  [97.9 F (36.6 C)-100.4 F (38 C)] 98.6 F (37 C) (02/21 0513) Pulse Rate:  [73-89] 87 (02/21 0513) Resp:  [16-20] 16 (02/21 0513) BP: (106-138)/(65-88) 117/68 (02/21 0513) SpO2:  [92 %-95 %] 93 % (02/21 0513)  Intake/Output from previous day: 02/20 0701 - 02/21 0700 In: 720 [P.O.:720] Out: 2075 [Urine:2075] Intake/Output this shift: No intake/output data recorded.  Physical Exam: Work of breathing is normal.  Tmax ~ 100.  Wound pic shows beefy red.  Passing flatus.  Lab Results:  Results for orders placed or performed during the hospital encounter of 08/02/19 (from the past 48 hour(s))  Lipase, blood     Status: None   Collection Time: 08/02/19 10:55 AM  Result Value Ref Range   Lipase 21 11 - 51 U/L    Comment: Performed at [facility], [place] [address] [place], [place] [address]  Comprehensive metabolic panel     Status: Abnormal   Collection Time: 08/02/19 10:55 AM  Result Value Ref Range   Sodium 134 (L) 135 - 145 mmol/L   Potassium 3.9 3.5 - 5.1 mmol/L   Chloride 98 98 - 111 mmol/L   CO2 24 22 - 32 mmol/L   Glucose, Bld 186 (H) 70 - 99 mg/dL   BUN 11 8 - 23 mg/dL   Creatinine, Ser 1.15 0.61 - 1.24 mg/dL   Calcium 9.1 8.9 - 10.3 mg/dL   Total Protein 7.6 6.5 - 8.1 g/dL   Albumin 3.8 3.5 - 5.0 g/dL   AST 19 15 - 41 U/L   ALT 26 0 - 44 U/L   Alkaline Phosphatase 68 38 - 126 U/L   Total Bilirubin 1.5 (H) 0.3 - 1.2 mg/dL   GFR calc non Af Amer >60 >60 mL/min   GFR calc Af Amer >60 >60 mL/min   Anion gap 12 5 - 15    Comment: Performed at [facility] [address] [place], [place] [address]  CBC     Status: Abnormal   Collection Time: 08/02/19 10:55 AM  Result Value Ref Range   WBC 12.5 (H) 4.0 - 10.5 K/uL   RBC 4.92 4.22 - 5.81 MIL/uL   Hemoglobin 15.3 13.0 - 17.0 g/dL   HCT 45.1 39.0 - 52.0 %   MCV 91.7 80.0 - 100.0 fL   MCH 31.1 26.0 - 34.0 pg   MCHC 33.9 30.0 - 36.0 g/dL   RDW 13.3 11.5 - 15.5 %   Platelets 186 150 - 400 K/uL   nRBC 0.0 0.0 - 0.2 %    Comment: Performed at [facility], [place] [address] [place],  [address]  Urinalysis, Routine w reflex microscopic     Status: None   Collection Time: 08/02/19  1:20 PM  Result Value Ref Range   Color, Urine YELLOW YELLOW   APPearance CLEAR CLEAR   Specific Gravity, Urine 1.018 1.005 - 1.030   pH 5.0 5.0 - 8.0   Glucose, UA NEGATIVE NEGATIVE mg/dL   Hgb urine dipstick NEGATIVE NEGATIVE   Bilirubin Urine NEGATIVE NEGATIVE   Ketones, ur NEGATIVE NEGATIVE mg/dL   Protein, ur NEGATIVE NEGATIVE mg/dL   Nitrite NEGATIVE NEGATIVE   Leukocytes,Ua NEGATIVE NEGATIVE  Comment: Performed at [facility], [place] [address] [place], [place] [address]  Respiratory Panel by RT PCR (Flu A&B, Covid) - Nasopharyngeal Swab     Status: None   Collection Time: 08/02/19  3:15 PM   Specimen: Nasopharyngeal Swab  Result Value Ref Range   SARS Coronavirus 2 by RT PCR NEGATIVE NEGATIVE    Comment: (NOTE) SARS-CoV-2 target nucleic acids are NOT DETECTED. The SARS-CoV-2 RNA is generally detectable in upper respiratoy specimens during the acute phase of infection. The lowest concentration of SARS-CoV-2 viral copies this assay can detect is 131 copies/mL. A negative result does not preclude SARS-Cov-2 infection and should not be used as the sole basis for treatment or other patient management decisions. A negative result may occur with  improper specimen collection/handling, submission of specimen other than nasopharyngeal swab, presence of viral mutation(s) within the areas targeted by this assay, and inadequate number of viral copies (<131 copies/mL). A negative result must be combined with clinical observations, patient history, and  epidemiological information. The expected result is Negative. Fact Sheet for Patients:  PinkCheek.be Fact Sheet for Healthcare Providers:  GravelBags.it This test is not yet ap proved or cleared by the [place] FDA and  has been authorized for detection and/or diagnosis of SARS-CoV-2 by FDA under an Emergency Use Authorization (EUA). This EUA will remain  in effect (meaning this test can be used) for the duration of the COVID-19 declaration under Section 564(b)(1) of the Act, 21 U.S.C. section 360bbb-3(b)(1), unless the authorization is terminated or revoked sooner.    Influenza A by PCR NEGATIVE NEGATIVE   Influenza B by PCR NEGATIVE NEGATIVE    Comment: (NOTE) The Xpert Xpress SARS-CoV-2/FLU/RSV assay is intended as an aid in  the diagnosis of influenza from Nasopharyngeal swab specimens and  should not be used as a sole basis for treatment. Nasal washings and  aspirates are unacceptable for Xpert Xpress SARS-CoV-2/FLU/RSV  testing. Fact Sheet for Patients: PinkCheek.be Fact Sheet for Healthcare Providers: GravelBags.it This test is not yet approved or cleared by the [place] FDA and  has been authorized for detection and/or diagnosis of SARS-CoV-2 by  FDA under an Emergency Use Authorization (EUA). This EUA will remain  in effect (meaning this test can be used) for the duration of the  Covid-19 declaration under Section 564(b)(1) of the Act, 21  U.S.C. section 360bbb-3(b)(1), unless the authorization is  terminated or revoked. Performed at [facility], [place] [address], [place], [place] [address]   Sample to Blood Bank     Status: None   Collection Time: 08/02/19  6:07 PM  Result Value Ref Range   Blood Bank Specimen SAMPLE AVAILABLE FOR TESTING    Sample Expiration      08/05/2019,2359 Performed at [facility], [place] [address],  [place], [place] [id number]   Basic metabolic panel     Status: Abnormal   Collection Time: 08/03/19  2:40 AM  Result Value Ref Range   Sodium 135 135 - 145 mmol/L   Potassium 4.3 3.5 - 5.1 mmol/L   Chloride 100 98 - 111 mmol/L   CO2 24 22 - 32 mmol/L   Glucose, Bld 153 (H) 70 - 99 mg/dL   BUN 12 8 - 23 mg/dL   Creatinine, Ser 1.24 0.61 - 1.24 mg/dL   Calcium 8.0 (L) 8.9 - 10.3 mg/dL   GFR calc non Af Amer 59 (L) >60 mL/min   GFR calc Af Amer >60 >60  mL/min   Anion gap 11 5 - 15    Comment: Performed at [facility] [address], [place], [place] [address]  CBC     Status: Abnormal   Collection Time: 08/03/19  2:40 AM  Result Value Ref Range   WBC 11.2 (H) 4.0 - 10.5 K/uL   RBC 4.34 4.22 - 5.81 MIL/uL   Hemoglobin 13.5 13.0 - 17.0 g/dL   HCT 39.4 39.0 - 52.0 %   MCV 90.8 80.0 - 100.0 fL   MCH 31.1 26.0 - 34.0 pg   MCHC 34.3 30.0 - 36.0 g/dL   RDW 13.4 11.5 - 15.5 %   Platelets 152 150 - 400 K/uL   nRBC 0.0 0.0 - 0.2 %    Comment: Performed at [facility], [place] [address] [place],  [address]    Radiology/Results: CT ABDOMEN PELVIS W CONTRAST  Result Date: 08/02/2019 CLINICAL DATA:  Abdominal pain after colonoscopy. EXAM: CT ABDOMEN AND PELVIS WITH CONTRAST TECHNIQUE: Multidetector CT imaging of the abdomen and pelvis was performed using the standard protocol following bolus administration of intravenous contrast. CONTRAST:  126mL OMNIPAQUE IOHEXOL 300 MG/ML  SOLN COMPARISON:  CT scan dated 02/11/2015 FINDINGS: Lower chest: Minimal atelectasis at the lung bases posteriorly, right more than left. Hepatobiliary: 9 mm solitary calcified gallstone. Gallbladder is not distended. No gallbladder wall thickening. Hepatic parenchyma is normal. No dilated bile ducts. Pancreas: Unremarkable. No pancreatic ductal dilatation or surrounding inflammatory changes. Spleen: Normal in size without focal abnormality. Adrenals/Urinary Tract: Adrenal glands are normal. 11 mm cyst in the  mid right kidney. Exophytic 22 mm cyst in the mid left kidney. Adjacent 8 mm cyst in the mid left kidney. No hydronephrosis. Bladder is normal. Stomach/Bowel: There is extensive free air in the abdomen. There is slight thickening of the wall of the cecum. There is a 15 mm long metallic device in the cecal lumen which may be a marker for previous polypectomy. There is a small amount of fluid surrounding the cecum with a small amount of fluid extending around the adjacent mesentery and into the upper right side of the pelvis. There is a small radiodensity in the posterior wall of the ascending colon, 4 mm in size, etiology indeterminate. The colon is not distended. Stomach appears normal. Minimally distended small bowel loops in the lower abdomen. Terminal ileum appears normal. Vascular/Lymphatic: Aortic atherosclerosis. No enlarged abdominal or pelvic lymph nodes. Reproductive: Prostate is unremarkable. Other: No abdominal wall hernias. Musculoskeletal: No acute abnormalities. 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 IMPRESSION: 1. Extensive free air in the abdomen consistent with bowel perforation. 2. Slight thickening of the wall of the cecum with a small amount of fluid surrounding the cecum and extending into the upper right side of the pelvis. I suspect that the cecum is the site of perforation. There is a 15 mm linear metallic device in the cecum. Did the patient have a clip placed in the cecum at the time of colonoscopy? 3. 4 mm radiodensity in the posterior wall of the ascending colon, etiology indeterminate. 4. Cholelithiasis. 5. Critical Value/emergent results were called by telephone at the time of interpretation on 08/02/2019 at 1:39 pm to provider [facility] , who verbally acknowledged these results. Aortic Atherosclerosis (ICD10-I70.0). Electronically Signed   By: Lorriane Shire M.D.   On: 08/02/2019 13:39    Anti-infectives: Anti-infectives (From admission, onward)   Start      Dose/Rate Route Frequency Ordered Stop  08/02/19 2200  piperacillin-tazobactam (ZOSYN) IVPB 3.375 g     3.375 g 12.5 mL/hr over 240 Minutes Intravenous Every 8 hours 08/02/19 1823 08/07/19 2159   08/02/19 1400  ciprofloxacin (CIPRO) IVPB 400 mg     400 mg 200 mL/hr over 60 Minutes Intravenous  Once 08/02/19 1347 08/02/19 1512   08/02/19 1400  metroNIDAZOLE (FLAGYL) IVPB 500 mg     500 mg 100 mL/hr over 60 Minutes Intravenous  Once 08/02/19 1347 08/02/19 1618      Assessment/Plan: Problem List: Patient Active Problem List   Diagnosis Date Noted  . Intra-abdominal free air of unknown etiology 08/02/2019  . Allergic reaction to alpha-gal   . Hyperlipidemia   . Hyperglycemia   . Multiple allergies 12/01/2010  . Bipolar 1 disorder ([place]) 12/01/2010  . Hypertension 12/01/2010  . Colon polyps 12/01/2010    Will advance diet.  Check CBC in am.   2 Days Post-Op    LOS: 2 days   Matt B. Hassell Done, MD, [facility] [phone] beeper [phone]  08/04/2019 9:35 AM

## 2019-08-05 LAB — CBC WITH DIFFERENTIAL/PLATELET
Abs Immature Granulocytes: 0.07 10*3/uL (ref 0.00–0.07)
Basophils Absolute: 0 10*3/uL (ref 0.0–0.1)
Basophils Relative: 0 %
Eosinophils Absolute: 0.3 10*3/uL (ref 0.0–0.5)
Eosinophils Relative: 2 %
HCT: 37.8 % — ABNORMAL LOW (ref 39.0–52.0)
Hemoglobin: 12.9 g/dL — ABNORMAL LOW (ref 13.0–17.0)
Immature Granulocytes: 1 %
Lymphocytes Relative: 16 %
Lymphs Abs: 1.8 10*3/uL (ref 0.7–4.0)
MCH: 30.9 pg (ref 26.0–34.0)
MCHC: 34.1 g/dL (ref 30.0–36.0)
MCV: 90.6 fL (ref 80.0–100.0)
Monocytes Absolute: 0.8 10*3/uL (ref 0.1–1.0)
Monocytes Relative: 7 %
Neutro Abs: 8.1 10*3/uL — ABNORMAL HIGH (ref 1.7–7.7)
Neutrophils Relative %: 74 %
Platelets: 198 10*3/uL (ref 150–400)
RBC: 4.17 MIL/uL — ABNORMAL LOW (ref 4.22–5.81)
RDW: 13.4 % (ref 11.5–15.5)
WBC: 11 10*3/uL — ABNORMAL HIGH (ref 4.0–10.5)
nRBC: 0 % (ref 0.0–0.2)

## 2019-08-05 LAB — SURGICAL PATHOLOGY

## 2019-08-05 MED ORDER — ACETAMINOPHEN 325 MG PO TABS
650.0000 mg | ORAL_TABLET | Freq: Four times a day (QID) | ORAL | Status: DC | PRN
  Administered 2019-08-06 – 2019-08-07 (×3): 650 mg via ORAL
  Filled 2019-08-05 (×3): qty 2

## 2019-08-05 MED ORDER — DOCUSATE SODIUM 100 MG PO CAPS
100.0000 mg | ORAL_CAPSULE | Freq: Two times a day (BID) | ORAL | Status: DC
  Administered 2019-08-05 – 2019-08-06 (×3): 100 mg via ORAL
  Filled 2019-08-05 (×5): qty 1

## 2019-08-05 MED ORDER — OXYCODONE HCL 5 MG PO TABS
5.0000 mg | ORAL_TABLET | ORAL | Status: DC | PRN
  Administered 2019-08-05 (×2): 10 mg via ORAL
  Administered 2019-08-06 (×2): 5 mg via ORAL
  Filled 2019-08-05 (×2): qty 1
  Filled 2019-08-05 (×2): qty 2

## 2019-08-05 MED ORDER — METHOCARBAMOL 500 MG PO TABS
500.0000 mg | ORAL_TABLET | Freq: Three times a day (TID) | ORAL | Status: DC | PRN
  Administered 2019-08-05 – 2019-08-06 (×3): 500 mg via ORAL
  Filled 2019-08-05 (×3): qty 1

## 2019-08-05 MED ORDER — HYDROMORPHONE HCL 1 MG/ML IJ SOLN: 1.0000 mg | INTRAMUSCULAR | Status: DC | PRN

## 2019-08-05 NOTE — Progress Notes (Signed)
3 Days Post-Op  Subjective: CC: Abdominal soreness  Doing well. Some soreness of the RLQ and around midline incision. Patient is tolerating FLD without n/v. He reports that he is passing a large amount of flatus. No BM. He is ambulating in the halls. Voiding without difficulty.   Objective: Vital signs in last 24 hours: Temp:  [98.4 F (36.9 C)-99.5 F (37.5 C)] 99.5 F (37.5 C) (02/22 0509) Pulse Rate:  [78-91] 78 (02/22 0509) Resp:  [17-20] 17 (02/22 0509) BP: (137-170)/(76-92) 160/92 (02/22 0509) SpO2:  [95 %-100 %] 100 % (02/22 0509) Weight:  [100.5 kg] 100.5 kg (02/22 0648) Last BM Date: 08/02/19  Intake/Output from previous day: 02/21 0701 - 02/22 0700 In: 1320 [P.O.:1320] Out: 975 [Urine:975] Intake/Output this shift: No intake/output data recorded.  PE: Gen:  Alert, NAD, pleasant Pulm: Rate and effort normal  Abd: Soft, mild distension, tenderness of the RLQ that is mild along with around midline wound that is appropriate. Midline wound with healthy appaearing granulation tissue at the base. No signs of infection or dehiscence. +BS Ext:  No LE edema  Psych: A&Ox3  Skin: no rashes noted, warm and dry  Lab Results:  Recent Labs    08/03/19 0240 08/05/19 0244  WBC 11.2* 11.0*  HGB 13.5 12.9*  HCT 39.4 37.8*  PLT 152 198   BMET Recent Labs    08/02/19 1055 08/03/19 0240  NA 134* 135  K 3.9 4.3  CL 98 100  CO2 24 24  GLUCOSE 186* 153*  BUN 11 12  CREATININE 1.15 1.24  CALCIUM 9.1 8.0*   PT/INR No results for input(s): LABPROT, INR in the last 72 hours. CMP     Component Value Date/Time   NA 135 08/03/2019 0240   K 4.3 08/03/2019 0240   CL 100 08/03/2019 0240   CO2 24 08/03/2019 0240   GLUCOSE 153 (H) 08/03/2019 0240   BUN 12 08/03/2019 0240   CREATININE 1.24 08/03/2019 0240   CREATININE 1.11 01/01/2019 0818   CALCIUM 8.0 (L) 08/03/2019 0240   PROT 7.6 08/02/2019 1055   ALBUMIN 3.8 08/02/2019 1055   AST 19 08/02/2019 1055   ALT 26  08/02/2019 1055   ALKPHOS 68 08/02/2019 1055   BILITOT 1.5 (H) 08/02/2019 1055   GFRNONAA 59 (L) 08/03/2019 0240   GFRNONAA 61 11/20/2017 0800   GFRAA >60 08/03/2019 0240   GFRAA 70 11/20/2017 0800   Lipase     Component Value Date/Time   LIPASE 21 08/02/2019 1055       Studies/Results: No results found.  Anti-infectives: Anti-infectives (From admission, onward)   Start     Dose/Rate Route Frequency Ordered Stop   08/02/19 2200  piperacillin-tazobactam (ZOSYN) IVPB 3.375 g     3.375 g 12.5 mL/hr over 240 Minutes Intravenous Every 8 hours 08/02/19 1823 08/07/19 2159   08/02/19 1400  ciprofloxacin (CIPRO) IVPB 400 mg     400 mg 200 mL/hr over 60 Minutes Intravenous  Once 08/02/19 1347 08/02/19 1512   08/02/19 1400  metroNIDAZOLE (FLAGYL) IVPB 500 mg     500 mg 100 mL/hr over 60 Minutes Intravenous  Once 08/02/19 1347 08/02/19 1618       Assessment/Plan HTN - home meds  HLD   Feculent peritonitis, perforated cecum polypectomy - s/p ex lap, ileocecectomy - Dr. Rosendo Gros  - 08/02/2019 - POD #3 - Adv to soft diet - Start on oral pain medication  - Mobilize and IS  - Patient reports Dr. Erlene Quan  called with path from polypectomy and it was benign. Unable to see in care everywhere.   FEN - Soft, SLIV, Colace  VTE - SCDs, Lovenox  ID - Cipro/Flagyl pre-op. Zosyn 2/19 >>   LOS: 3 days    Jillyn Ledger , [facility] 08/05/2019, 10:38 AM Please see Amion for pager number during day hours 7:00am-4:30pm

## 2019-08-06 LAB — URINALYSIS, ROUTINE W REFLEX MICROSCOPIC
Bilirubin Urine: NEGATIVE
Glucose, UA: NEGATIVE mg/dL
Hgb urine dipstick: NEGATIVE
Ketones, ur: NEGATIVE mg/dL
Leukocytes,Ua: NEGATIVE
Nitrite: NEGATIVE
Protein, ur: NEGATIVE mg/dL
Specific Gravity, Urine: 1.014 (ref 1.005–1.030)
pH: 5 (ref 5.0–8.0)

## 2019-08-06 LAB — BASIC METABOLIC PANEL
Anion gap: 13 (ref 5–15)
BUN: 11 mg/dL (ref 8–23)
CO2: 23 mmol/L (ref 22–32)
Calcium: 8.6 mg/dL — ABNORMAL LOW (ref 8.9–10.3)
Chloride: 101 mmol/L (ref 98–111)
Creatinine, Ser: 1.1 mg/dL (ref 0.61–1.24)
GFR calc Af Amer: >60 mL/min (ref >60)
GFR calc non Af Amer: >60 mL/min (ref >60)
Glucose, Bld: 151 mg/dL — ABNORMAL HIGH (ref 70–99)
Potassium: 3.4 mmol/L — ABNORMAL LOW (ref 3.5–5.1)
Sodium: 137 mmol/L (ref 135–145)

## 2019-08-06 LAB — CBC
HCT: 38.2 % — ABNORMAL LOW (ref 39.0–52.0)
Hemoglobin: 13.3 g/dL (ref 13.0–17.0)
MCH: 30.9 pg (ref 26.0–34.0)
MCHC: 34.8 g/dL (ref 30.0–36.0)
MCV: 88.8 fL (ref 80.0–100.0)
Platelets: 212 10*3/uL (ref 150–400)
RBC: 4.3 MIL/uL (ref 4.22–5.81)
RDW: 13.3 % (ref 11.5–15.5)
WBC: 10.8 10*3/uL — ABNORMAL HIGH (ref 4.0–10.5)
nRBC: 0 % (ref 0.0–0.2)

## 2019-08-06 MED ORDER — BISACODYL 10 MG RE SUPP
10.0000 mg | Freq: Once | RECTAL | Status: AC
  Administered 2019-08-06: 13:00:00 10 mg via RECTAL
  Filled 2019-08-06: qty 1

## 2019-08-06 MED ORDER — AMOXICILLIN-POT CLAVULANATE 875-125 MG PO TABS
1.0000 | ORAL_TABLET | Freq: Two times a day (BID) | ORAL | Status: DC
  Administered 2019-08-06 – 2019-08-07 (×3): 1 via ORAL
  Filled 2019-08-06 (×4): qty 1

## 2019-08-06 NOTE — Progress Notes (Signed)
4 Days Post-Op    CC: Abdominal pain post colonoscopy  Subjective: He is tolerating soft diet well.  No BM so far.  He also complained of some pain with voiding earlier.  His midline incision looks fine.  I did a teaching session with his wife for doing the dressing change.  Overall I think he is making very good progress.  Objective: Vital signs in last 24 hours: Temp:  [98.3 F (36.8 C)-99.8 F (37.7 C)] 98.3 F (36.8 C) (02/23 0549) Pulse Rate:  [73-90] 73 (02/23 0549) Resp:  [15-18] 16 (02/23 0549) BP: (149-159)/(87-91) 159/91 (02/23 0549) SpO2:  [93 %-96 %] 95 % (02/23 0549) Weight:  [100.8 kg-102.4 kg] 102.4 kg (02/23 0500) Last BM Date: 08/02/19 1957 p.o. 450 IV 975 urine No BM recorded T-max 99.8 Blood pressure diastolic slightly elevated. K+ 3.4 WBC 10.8 Intake/Output from previous day: 02/22 0701 - 02/23 0700 In: 2407 [P.O.:1957; IV Piggyback:450] Out: 975 [Urine:975] Intake/Output this shift: Total I/O In: 420 [P.O.:420] Out: -   General appearance: alert, cooperative and no distress Resp: clear to auscultation bilaterally GI: Open abdominal wound is clean and looks good.  Positive bowel sounds no BM.  Some flatus. Extremities: extremities normal, atraumatic, no cyanosis or edema  Lab Results:  Recent Labs    08/05/19 0244 08/06/19 0219  WBC 11.0* 10.8*  HGB 12.9* 13.3  HCT 37.8* 38.2*  PLT 198 212    BMET Recent Labs    08/06/19 0219  NA 137  K 3.4*  CL 101  CO2 23  GLUCOSE 151*  BUN 11  CREATININE 1.10  CALCIUM 8.6*   PT/INR No results for input(s): LABPROT, INR in the last 72 hours.  Recent Labs  Lab 08/02/19 1055  AST 19  ALT 26  ALKPHOS 68  BILITOT 1.5*  PROT 7.6  ALBUMIN 3.8     Lipase     Component Value Date/Time   LIPASE 21 08/02/2019 1055     Medications: . allopurinol  300 mg Oral Daily  . amLODipine  5 mg Oral Daily  . docusate sodium  100 mg Oral BID  . enoxaparin (LOVENOX) injection  40 mg Subcutaneous  Q24H  . famotidine  40 mg Oral Daily  . feeding supplement  237 mL Oral BID BM  . lisinopril  20 mg Oral Daily   . piperacillin-tazobactam (ZOSYN)  IV 3.375 g (08/06/19 0545)  . sodium chloride      Assessment/Plan HLD   Feculent peritonitis, perforated cecum polypectomy - s/p ex lap, ileocecectomy - Dr. Rosendo Gros  - 08/02/2019 - POD #4 - Adv to soft diet - Start on oral pain medication  - Mobilize and IS  - Patient reports Dr. Erlene Quan called with path from polypectomy and it was benign. Unable to see in care everywhere.   FEN - Soft, SLIV, Colace  VTE - SCDs, Lovenox  ID - Cipro/Flagyl pre-op. Zosyn 2/19 >> day 5    Plan: I will check a urinalysis on him.  Give him a Dulcolax suppository.  Saline lock his IV and switch him over to Augmentin.  Aim for discharge later today or tomorrow after he is ambulated more and he has had a bowel movement.  LOS: 4 days    Daniel Blackwell 08/06/2019 Please see Amion

## 2019-08-07 MED ORDER — ACETAMINOPHEN 325 MG PO TABS: 650.0000 mg | ORAL_TABLET | Freq: Four times a day (QID) | ORAL | Status: DC | PRN

## 2019-08-07 MED ORDER — ONDANSETRON HCL 4 MG PO TABS: 4.0000 mg | ORAL_TABLET | Freq: Four times a day (QID) | ORAL | 0 refills | Status: DC | PRN

## 2019-08-07 MED ORDER — DOCUSATE SODIUM 100 MG PO CAPS: 100.0000 mg | ORAL_CAPSULE | Freq: Two times a day (BID) | ORAL | 0 refills | Status: DC | PRN

## 2019-08-07 MED ORDER — METHOCARBAMOL 500 MG PO TABS: 500.0000 mg | ORAL_TABLET | Freq: Three times a day (TID) | ORAL | 0 refills | Status: DC | PRN

## 2019-08-07 MED ORDER — AMOXICILLIN-POT CLAVULANATE 875-125 MG PO TABS: 1.0000 | ORAL_TABLET | Freq: Two times a day (BID) | ORAL | 0 refills | Status: DC

## 2019-08-07 MED ORDER — OXYCODONE HCL 5 MG PO TABS: 5.0000 mg | ORAL_TABLET | Freq: Four times a day (QID) | ORAL | 0 refills | Status: DC | PRN

## 2019-08-07 NOTE — Discharge Summary (Signed)
Patient ID: Daniel Blackwell:[id number] 1950/07/10 69 y.o.  Admit date: 08/02/2019 Discharge date: 08/07/2019  Admitting Diagnosis: Right sided abdominal pain Free air concerning for bowel perforation Peritonitis   Discharge Diagnosis Feculent peritonitis, perforated cecum after polypectomy HTN HLD Bipolar 1 disorder Gout   Consultants None  H&P: Daniel Blackwell is a 69 y.o. male with history of hypertension, and hyperlipidemia who presented to [facility] ED for abdominal pain earlier today.  Patient reports that he had a colonoscopy on Wednesday, 2/17, by Dr. Erlene Quan in [place] where he had a 20 mm polyp removed and clipped in the cecum.  After the procedure he began having abdominal pain that was generalized but worse on the right.  His abdominal pain has become constant and worsened over the last few days.  This morning his abdominal pain became more severe so he presented to the ED.  His symptoms were worsened by bumps in the road on the way over. Nothing has made his symptoms better. He notes associated fever of 100.4.  He reports that he get screening colonoscopies every 5 years for polyps.  In the ED he is noted to be tachycardic in the 110s.  Afebrile and without hypotension.  WBC 12.5.  CT with extensive free air concerning for bowel perforation.  There is thickening of the wall of the cecum with a small amount of fluid surrounding the cecum and extending into the upper right side of the pelvis.  General surgery was asked to see.  Patient last p.o. intake was at 9 AM this morning. No prior abdominal surgeries. He is not on any blood thinners.   Procedures Dr. Rosendo Gros - Exploratory Laparotomy, Ileocecectomy - 08/02/2019   Hospital Course:  Patient was taken emergently to the OR by Dr. Rosendo Gros where he was found to have feculent peritonitis, perforated cecum after polypectomy for which he underwent an ileocecectomy for. Patient tolerated the procedure well and was transferred back  to the floor. Patients diet was advanced and tolerated. He reports he spoke with his GI doctor who told him the path on his polypectomy was benign. On POD 5, the patient was voiding well, having bowel function, tolerating diet, ambulating well, pain well controlled, vital signs stable, midline wound healthy appearing and felt stable for discharge home. Follow up arranged as below.   Physical Exam: Gen:  Alert, NAD, pleasant Card: RRR Lungs: CTA b/l, normal rate and effort Abd: Soft, mild distension, minimal tenderness along midline wound that is appropriate. Midline wound with healthy beefy red granulation tissue at the base. No signs of infection or dehiscence. +BS Ext:  No LE edema  Psych: A&Ox3  Skin: no rashes noted, warm and dry  Allergies as of 08/07/2019      Reactions   Asa [aspirin] Swelling   Other Swelling   CASHEWS   Penicillins Rash   Did it involve swelling of the face/tongue/throat, SOB, or low BP? No Did it involve sudden or severe rash/hives, skin peeling, or any reaction on the inside of your mouth or nose? No Did you need to seek medical attention at a hospital or doctor's office? No When did it last happen?50 years ago If all above answers are "NO", may proceed with cephalosporin use.   Sulfa Antibiotics Rash      Medication List    TAKE these medications   acetaminophen 325 MG tablet Commonly known as: TYLENOL Take 2 tablets (650 mg total) by mouth every 6 (six) hours as needed for  mild pain.   allopurinol 300 MG tablet Commonly known as: ZYLOPRIM TAKE 1 TABLET(300 MG) BY MOUTH DAILY What changed:   how much to take  how to take this  when to take this  additional instructions   amLODipine 5 MG tablet Commonly known as: NORVASC Take 1 tablet (5 mg total) by mouth daily.   amoxicillin-clavulanate 875-125 MG tablet Commonly known as: AUGMENTIN Take 1 tablet by mouth every 12 (twelve) hours.   aspirin 81 MG tablet Take 81 mg by mouth  daily.   colchicine 0.6 MG tablet TAKE 1 TABLET(0.6 MG) BY MOUTH DAILY What changed: See the new instructions.   docusate sodium 100 MG capsule Commonly known as: COLACE Take 1 capsule (100 mg total) by mouth 2 (two) times daily as needed for mild constipation.   EPINEPHrine 0.3 mg/0.3 mL Soaj injection Commonly known as: EPI-PEN Inject 0.3 mLs (0.3 mg total) into the muscle as needed for anaphylaxis.   famotidine 40 MG tablet Commonly known as: Pepcid Take 1 tablet (40 mg total) by mouth daily.   Fish Oil 1000 MG Cpdr Take 2 capsules by mouth See admin instructions. 2-3 times weekly   indomethacin 50 MG capsule Commonly known as: INDOCIN Take 1 capsule (50 mg total) by mouth 3 (three) times daily with meals. What changed:   when to take this  reasons to take this   levocetirizine 5 MG tablet Commonly known as: Xyzal Take 1 tablet (5 mg total) by mouth every evening.   lisinopril 20 MG tablet Commonly known as: ZESTRIL TAKE 1 TABLET(20 MG) BY MOUTH DAILY What changed: See the new instructions.   methocarbamol 500 MG tablet Commonly known as: ROBAXIN Take 1 tablet (500 mg total) by mouth every 8 (eight) hours as needed for muscle spasms.   multivitamin tablet Take 1 tablet by mouth daily.   ondansetron 4 MG tablet Commonly known as: ZOFRAN Take 1 tablet (4 mg total) by mouth every 6 (six) hours as needed for nausea.   oxyCODONE 5 MG immediate release tablet Commonly known as: Oxy IR/ROXICODONE Take 1 tablet (5 mg total) by mouth every 6 (six) hours as needed for breakthrough pain.   vitamin B-12 1000 MCG tablet Commonly known as: CYANOCOBALAMIN Take 1,000 mcg by mouth daily.            Discharge Care Instructions  (From admission, onward)         Start     Ordered   08/07/19 0000  Discharge wound care:    Comments: Wet to Dry WOUND CARE: - Change dressing twice daily - Supplies: sterile saline, kerlex, scissors, ABD pads, tape  Remove dressing  and all packing carefully, moistening with sterile saline as needed to avoid packing/internal dressing sticking to the wound. 2.   Clean edges of skin around the wound with water/gauze, making sure there is no tape debris or leakage left on skin that could cause skin irritation or breakdown. 3.   Dampen and clean kerlex with sterile saline and pack wound from wound base to skin level, making sure to take note of any possible areas of wound tracking, tunneling and packing appropriately. Wound can be packed loosely. Trim kerlex to size if a whole kerlex is not required. 4.   Cover wound with a dry ABD pad and secure with tape.  5.   Write the date/time on the dry dressing/tape to better track when the last dressing change occurred. - apply any skin protectant/powder if recommended by clinician  to protect skin/skin folds. - change dressing as needed if leakage occurs, wound gets contaminated, or patient requests to shower. - You may shower daily with wound open and following the shower the wound should be dried and a clean dressing placed.  - Medical grade tape as well as packing supplies can be found at [facility] on [address]. The remaining supplies can be found at your local drug store, [employer] etc.   08/07/19 0953           Follow-up Information    Breck Coons, MD Follow up.   Specialty: Internal Medicine Why: Call for follow-up appointment Contact information: [place] [address] [phone]        Ralene Ok, MD Follow up on 08/20/2019.   Specialty: General Surgery Why: Your appointment is at 11:40 AM.  Be at the office 30 minutes early for check in. Bring photo ID and insurance information with you.   Contact information: [place] [address] [phone]        Susy Frizzle, MD Follow up.   Specialty: Family Medicine Why: Call and let him know what happened and follow-up for medical  issues. Contact information: [address] [place] [address] [phone]           Signed: Alferd Apa, [facility] 08/07/2019, 9:54 AM Please see Amion for pager number during day hours 7:00am-4:30pm

## 2019-08-07 NOTE — Progress Notes (Signed)
Daniel Blackwell to be D/C'd  per MD order. Discussed with the patient and all questions fully answered.  VSS, Skin clean, dry and intact without evidence of skin break down, no evidence of skin tears noted.  IV catheter discontinued intact. Site without signs and symptoms of complications. Dressing and pressure applied.  An After Visit Summary was printed and given to the patient. Patient received prescription.  D/c education completed with patient/family including follow up instructions, medication list, d/c activities limitations if indicated, with other d/c instructions as indicated by MD - patient able to verbalize understanding, all questions fully answered.   Patient instructed to return to ED, call 911, or call MD for any changes in condition.   Patient to be escorted via [place], and D/C home via private auto.

## 2019-08-07 NOTE — Discharge Instructions (Addendum)
[facility]      [facility] [phone]  OPEN ABDOMINAL SURGERY: POST OP INSTRUCTIONS  Always review your discharge instruction sheet given to you by the facility where your surgery was performed.  IF YOU HAVE DISABILITY OR FAMILY LEAVE FORMS, YOU MUST BRING THEM TO THE OFFICE FOR PROCESSING.  PLEASE DO NOT GIVE THEM TO YOUR DOCTOR.  1. A prescription for pain medication may be given to you upon discharge.  Take your pain medication as prescribed, if needed.  If narcotic pain medicine is not needed, then you may take acetaminophen (Tylenol) or ibuprofen (Advil) as needed. 2. Take your usually prescribed medications unless otherwise directed. 3. If you need a refill on your pain medication, please contact your pharmacy. They will contact our office to request authorization.  Prescriptions will not be filled after 5pm or on week-ends. 4. You should follow a light diet the first few days after arrival home, such as soup and crackers, pudding, etc.unless your doctor has advised otherwise. A high-fiber, low fat diet can be resumed as tolerated.   Be sure to include lots of fluids daily. Most patients will experience some swelling and bruising on the chest and neck area.  Ice packs will help.  Swelling and bruising can take several days to resolve 5. Most patients will experience some swelling and bruising in the area of the incision. Ice pack will help. Swelling and bruising can take several days to resolve..  6. It is common to experience some constipation if taking pain medication after surgery.  Increasing fluid intake and taking a stool softener will usually help or prevent this problem from occurring.  A mild laxative (Milk of Magnesia or Miralax) should be taken according to package directions if there are no bowel movements after 48 hours. 7.  You may have steri-strips (small skin tapes) in place directly over the incision.  These strips should be left on the skin for 7-10 days.  If your  surgeon used skin glue on the incision, you may shower in 24 hours.  The glue will flake off over the next 2-3 weeks.  Any sutures or staples will be removed at the office during your follow-up visit. You may find that a light gauze bandage over your incision may keep your staples from being rubbed or pulled. You may shower and replace the bandage daily. 8. ACTIVITIES:  You may resume regular (light) daily activities beginning the next day--such as daily self-care, walking, climbing stairs--gradually increasing activities as tolerated.  You may have sexual intercourse when it is comfortable.  Refrain from any heavy lifting or straining until approved by your doctor. a. You may drive when you no longer are taking prescription pain medication, you can comfortably wear a seatbelt, and you can safely maneuver your car and apply brakes b. Return to Work: ___________________________________ 9. You should see your doctor in the office for a follow-up appointment approximately two weeks after your surgery.  Make sure that you call for this appointment within a day or two after you arrive home to insure a convenient appointment time. OTHER INSTRUCTIONS:  _____________________________________________________________ _____________________________________________________________  WHEN TO CALL YOUR DOCTOR: 1. Fever over 101.0 2. Inability to urinate 3. Nausea and/or vomiting 4. Extreme swelling or bruising 5. Continued bleeding from incision. 6. Increased pain, redness, or drainage from the incision. 7. Difficulty swallowing or breathing 8. Muscle cramping or spasms. 9. Numbness or tingling in hands or feet or around lips.  The clinic staff is available to   answer your questions during regular business hours.  Please don't hesitate to call and ask to speak to one of the nurses if you have concerns.  For further questions, please visit [email or web address]   Wet to Dry WOUND CARE: - Change  dressing twice daily - Supplies: sterile saline, kerlex, scissors, ABD pads, tape  1. Remove dressing and all packing carefully, moistening with sterile saline as needed to avoid packing/internal dressing sticking to the wound. 2.   Clean edges of skin around the wound with water/gauze, making sure there is no tape debris or leakage left on skin that could cause skin irritation or breakdown. 3.   Dampen and clean kerlex with sterile saline and pack wound from wound base to skin level, making sure to take note of any possible areas of wound tracking, tunneling and packing appropriately. Wound can be packed loosely. Trim kerlex to size if a whole kerlex is not required. 4.   Cover wound with a dry ABD pad and secure with tape.  5.   Write the date/time on the dry dressing/tape to better track when the last dressing change occurred. - apply any skin protectant/powder if recommended by clinician to protect skin/skin folds. - change dressing as needed if leakage occurs, wound gets contaminated, or patient requests to shower. - You may shower daily with wound open and following the shower the wound should be dried and a clean dressing placed.  - Medical grade tape as well as packing supplies can be found at [facility] on [address]. The remaining supplies can be found at your local drug store, [employer] etc.   Mechanical Wound Debridement Mechanical wound debridement is a treatment to remove dead tissue from a wound. This helps the wound heal. The treatment involves cleaning the wound by using mechanical force. This may include:  Wound irrigation. This method involves rinsing the wound out with fluid.  Wet-to-dry dressing. This method involves putting wet dressings into the wound, allowing them to dry, then removing the dry dressings to remove debris and tissue from the wound.  Using a pad or gauze to remove dead tissue and debris from the wound.  Pulsatile lavage. This method involves cleaning  the wound with a pressurized stream of fluid. Depending on the wound, you may need to repeat this procedure or change to another form of debridement as your wound starts to heal. Tell a health care provider about:  Any allergies you have.  All medicines you are taking, including vitamins, herbs, eye drops, creams, and over-the-counter medicines.  Any blood disorders you have.  Any medical conditions you have, or have had, especially conditions that cause a decrease in blood flow to the wound area, such as peripheral vascular disease, or conditions that affect your body's defense (immune) system or white blood count.  Any surgeries you have had.  Whether you are pregnant or may be pregnant. What are the risks? Generally, this is a safe procedure. However, problems may occur, including:  Infection.  Bleeding.  Damage to healthy tissue in and around your wound.  Soreness or pain.  Failure of the wound to heal.  Scarring. What happens during the procedure?   Your health care provider may apply a numbing medicine (topical anesthetic) to the wound.  Your health care provider may irrigate your wound with a germ-free salt solution (saline). This helps to loosen or remove debris, bacteria, and dead tissue.  Depending on the type of mechanical wound debridement you are having, your health care provider  may do one of the following: ? Put a dressing on your wound. You may have a dry gauze pad placed into the wound. Your health care provider will remove the gauze after the wound is dry. Any dead tissue and debris that has dried into the gauze will be lifted out of the wound (wet-to-dry debridement). ? Use a type of pad (monofilament fiber debridement pad) that has a fluffy surface on one side that picks up dead tissue and debris from your wound. Your health care provider wets the pad and wipes it over your wound for several minutes. ? Flush (irrigate) your wound with a pressurized stream of  solution such as saline or water (pulsatile lavage).  Once your health care provider is finished, he or she may apply a light dressing to your wound. The procedure may vary among health care providers and hospitals. What happens after the procedure?  You may get medicine for pain. Summary  Mechanical wound debridement is a treatment to remove dead tissue from a wound. This helps the wound heal.  Depending on the wound, you may need to repeat this procedure or change to another form of debridement as your wound starts to heal.  This treatment may include: wound irrigation, wet-to-dry dressing, using a pad or gauze to remove dead tissue and debris from the wound, or pulsatile lavage. This information is not intended to replace advice given to you by your health care provider. Make sure you discuss any questions you have with your health care provider. Document Revised: 05/07/2018 Document Reviewed: 05/07/2018 Elsevier Patient Education  2020 [employer].

## 2019-08-07 NOTE — Plan of Care (Signed)
  Problem: Education: Goal: Required Educational Video(s) Outcome: Completed/Met   Problem: Clinical Measurements: Goal: Ability to maintain clinical measurements within normal limits will improve Outcome: Completed/Met Goal: Postoperative complications will be avoided or minimized Outcome: Completed/Met   Problem: Skin Integrity: Goal: Demonstration of wound healing without infection will improve Outcome: Completed/Met   Problem: Education: Goal: Knowledge of General Education information will improve Description: Including pain rating scale, medication(s)/side effects and non-pharmacologic comfort measures Outcome: Completed/Met   Problem: Health Behavior/Discharge Planning: Goal: Ability to manage health-related needs will improve Outcome: Completed/Met   Problem: Clinical Measurements: Goal: Ability to maintain clinical measurements within normal limits will improve Outcome: Completed/Met Goal: Will remain free from infection Outcome: Completed/Met Goal: Diagnostic test results will improve Outcome: Completed/Met Goal: Respiratory complications will improve Outcome: Completed/Met Goal: Cardiovascular complication will be avoided Outcome: Completed/Met   Problem: Activity: Goal: Risk for activity intolerance will decrease Outcome: Completed/Met   Problem: Nutrition: Goal: Adequate nutrition will be maintained Outcome: Completed/Met   Problem: Coping: Goal: Level of anxiety will decrease Outcome: Completed/Met   Problem: Elimination: Goal: Will not experience complications related to bowel motility Outcome: Completed/Met Goal: Will not experience complications related to urinary retention Outcome: Completed/Met   Problem: Pain Managment: Goal: General experience of comfort will improve Outcome: Completed/Met   Problem: Safety: Goal: Ability to remain free from injury will improve Outcome: Completed/Met   Problem: Skin Integrity: Goal: Risk for impaired  skin integrity will decrease Outcome: Completed/Met   

## 2019-08-12 ENCOUNTER — Other Ambulatory Visit: Payer: Self-pay | Admitting: Family Medicine

## 2019-08-12 DIAGNOSIS — L509 Urticaria, unspecified: Secondary | ICD-10-CM

## 2019-08-14 ENCOUNTER — Encounter: Payer: Self-pay | Admitting: Family Medicine

## 2019-08-23 ENCOUNTER — Other Ambulatory Visit: Payer: Self-pay | Admitting: Family Medicine

## 2019-08-23 DIAGNOSIS — I10 Essential (primary) hypertension: Secondary | ICD-10-CM

## 2019-09-08 ENCOUNTER — Other Ambulatory Visit: Payer: Self-pay | Admitting: Family Medicine

## 2019-09-08 DIAGNOSIS — L509 Urticaria, unspecified: Secondary | ICD-10-CM

## 2019-09-09 ENCOUNTER — Other Ambulatory Visit: Payer: Self-pay | Admitting: Family Medicine

## 2019-09-09 DIAGNOSIS — L509 Urticaria, unspecified: Secondary | ICD-10-CM

## 2019-11-07 ENCOUNTER — Other Ambulatory Visit: Payer: Self-pay | Admitting: Family Medicine

## 2019-12-12 DIAGNOSIS — T781XXA Other adverse food reactions, not elsewhere classified, initial encounter: Secondary | ICD-10-CM

## 2019-12-12 HISTORY — DX: Other adverse food reactions, not elsewhere classified, initial encounter: T78.1XXA

## 2020-01-02 DIAGNOSIS — L821 Other seborrheic keratosis: Secondary | ICD-10-CM | POA: Diagnosis not present

## 2020-01-02 DIAGNOSIS — L905 Scar conditions and fibrosis of skin: Secondary | ICD-10-CM | POA: Diagnosis not present

## 2020-01-02 DIAGNOSIS — D225 Melanocytic nevi of trunk: Secondary | ICD-10-CM | POA: Diagnosis not present

## 2020-01-02 DIAGNOSIS — L820 Inflamed seborrheic keratosis: Secondary | ICD-10-CM | POA: Diagnosis not present

## 2020-01-02 DIAGNOSIS — D485 Neoplasm of uncertain behavior of skin: Secondary | ICD-10-CM | POA: Diagnosis not present

## 2020-02-25 ENCOUNTER — Other Ambulatory Visit: Payer: Self-pay | Admitting: Family Medicine

## 2020-02-25 DIAGNOSIS — L509 Urticaria, unspecified: Secondary | ICD-10-CM

## 2020-03-26 ENCOUNTER — Other Ambulatory Visit: Payer: Self-pay | Admitting: Family Medicine

## 2020-03-26 DIAGNOSIS — L509 Urticaria, unspecified: Secondary | ICD-10-CM

## 2020-04-27 ENCOUNTER — Ambulatory Visit: Payer: Medicare PPO | Admitting: Allergy

## 2020-04-27 ENCOUNTER — Other Ambulatory Visit: Payer: Self-pay

## 2020-04-27 ENCOUNTER — Encounter: Payer: Self-pay | Admitting: Allergy

## 2020-04-27 VITALS — BP 128/74 | HR 78 | Temp 98.7°F | Resp 16 | Ht 68.5 in | Wt 216.0 lb

## 2020-04-27 DIAGNOSIS — T781XXD Other adverse food reactions, not elsewhere classified, subsequent encounter: Secondary | ICD-10-CM

## 2020-04-27 DIAGNOSIS — T783XXD Angioneurotic edema, subsequent encounter: Secondary | ICD-10-CM | POA: Diagnosis not present

## 2020-04-27 DIAGNOSIS — Z889 Allergy status to unspecified drugs, medicaments and biological substances status: Secondary | ICD-10-CM | POA: Diagnosis not present

## 2020-04-27 DIAGNOSIS — L509 Urticaria, unspecified: Secondary | ICD-10-CM

## 2020-04-27 DIAGNOSIS — Z91018 Allergy to other foods: Secondary | ICD-10-CM

## 2020-04-27 DIAGNOSIS — J310 Chronic rhinitis: Secondary | ICD-10-CM

## 2020-04-27 DIAGNOSIS — T783XXA Angioneurotic edema, initial encounter: Secondary | ICD-10-CM | POA: Insufficient documentation

## 2020-04-27 DIAGNOSIS — T7819XD Other adverse food reactions, not elsewhere classified, subsequent encounter: Secondary | ICD-10-CM

## 2020-04-27 DIAGNOSIS — T781XXA Other adverse food reactions, not elsewhere classified, initial encounter: Secondary | ICD-10-CM | POA: Insufficient documentation

## 2020-04-27 MED ORDER — FLUTICASONE PROPIONATE 50 MCG/ACT NA SUSP: 1.0000 | Freq: Two times a day (BID) | NASAL | 5 refills | Status: DC

## 2020-04-27 NOTE — Patient Instructions (Addendum)
Today's skin testing showed: Borderline positive to pork.  Alpha gal allergy:  Continue to avoid red meat - beef, pork lamb.  For mild symptoms you can take over the counter antihistamines such as Benadryl and monitor symptoms closely. If symptoms worsen or if you have severe symptoms including breathing issues, throat closure, significant swelling, whole body hives, severe diarrhea and vomiting, lightheadedness then inject epinephrine and seek immediate medical care afterwards.  Emergency action plan given.   Hives/lip swelling:  Keep track of symptoms and episodes.  If you have lip swelling without the hives - may need to stop lisinopril Get bloodwork to rule out other causes.  We are ordering labs, so please allow 1-2 weeks for the results to come back. With the newly implemented Cures Act, the labs might be visible to you at the same time that they become visible to me. However, I will not address the results until all of the results are back, so please be patient.  In the meantime, continue recommendations in your patient instructions, including avoidance measures (if applicable), until you hear from me.  Rhinitis:   May use over the counter antihistamines such as Zyrtec (cetirizine), Claritin (loratadine), Allegra (fexofenadine), or Xyzal (levocetirizine) daily as needed.  May use Flonase (fluticasone) nasal spray 1 spray per nostril twice a day as needed for nasal congestion.   See if it helps with the headaches, earfullness  If no benefit will refer to ENT next.   Nasal saline spray (i.e., Simply Saline) or nasal saline lavage (i.e., NeilMed) is recommended as needed and prior to medicated nasal sprays.  Drug allergy:  Continue avoid medications on your allergy list.   Food:  Continue to avoid cashew for now.   Follow up in 2 months or sooner if needed.

## 2020-04-27 NOTE — Assessment & Plan Note (Signed)
   Continue to avoid red meat - beef, pork lamb.  For mild symptoms you can take over the counter antihistamines such as Benadryl and monitor symptoms closely. If symptoms worsen or if you have severe symptoms including breathing issues, throat closure, significant swelling, whole body hives, severe diarrhea and vomiting, lightheadedness then inject epinephrine and seek immediate medical care afterwards.  Emergency action plan given.   Recheck alpha-gal level.

## 2020-04-27 NOTE — Assessment & Plan Note (Signed)
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 He was doing well but recently had a hive outbreak with lip swelling. Thinks the food he ate may have had pork in it but not sure. Concerned if he's developing new allergies.  Today's skin testing showed: Borderline positive to pork. Negative to environmental allergies. Slightly dermatographic on exam.   Keep track of symptoms and episodes. Get bloodwork as below rule out other etiologies.

## 2020-04-27 NOTE — Assessment & Plan Note (Signed)
Perennial rhino conjunctivitis symptoms for 50+ years. Main concerns today are the headaches and ear fullness. Skin testing over 30 years ago showed multiple positives and was on AIT for less than 1 year at that time with unknown benefit.   Today's skin testing showed: negative to environmental allergies.   Will double check via bloodwork instead of intradermals due to some mild dermatographism.   May use over the counter antihistamines such as Zyrtec (cetirizine), Claritin (loratadine), Allegra (fexofenadine), or Xyzal (levocetirizine) daily as needed.  May use Flonase (fluticasone) nasal spray 1 spray per nostril twice a day as needed for nasal congestion.   See if it helps with the headaches, earfullness  If no benefit will refer to ENT next.   Nasal saline spray (i.e., Simply Saline) or nasal saline lavage (i.e., NeilMed) is recommended as needed and prior to medicated nasal sprays.

## 2020-04-27 NOTE — Assessment & Plan Note (Signed)
   Continue avoid medications on your allergy list - penicillin, aspiring and sulfa.  Consider penicillin skin testing in future.

## 2020-04-27 NOTE — Assessment & Plan Note (Signed)
Cashews caused rash in the past. 2021 bloodwork was negative to cashew.  Today's skin testing was negative to cashew.  Continue to avoid cashew for now.   Will discuss re-introduction at next visit.

## 2020-04-27 NOTE — Progress Notes (Signed)
New Patient Note  RE: Daniel Blackwell MRN: [id number] DOB: 10/03/1950 Date of Office Visit: 04/27/2020  Referring provider: Susy Frizzle, MD Primary care provider: Susy Frizzle, MD  Chief Complaint: Allergy Testing (Says he woke up saturday morning and his lips were swollen. he had issues with welps under arms and on waist. beef allergy is known and he does ot recall having any)  History of Present Illness: I had the pleasure of seeing Daniel Blackwell for initial evaluation at the [facility] on 04/27/2020. He is a 69 y.o. male, who is self-referred here for the evaluation of lip swelling.  Hives:  Rash started about 1 year ago. Mainly occurs on his armpits and around his beltline. Describes them as itchy, red and raised. Individual rashes lasts about 1 day. No ecchymosis upon resolution. Associated symptoms include: none. Denies any fevers, chills, changes in medications, foods, personal care products or recent infections. He has tried the following therapies: Allegra daily with some benefit.  Previous work up includes: diagnosed with alpha-gal allergy about 1 year ago due to above symptoms and had recent tick bites at that time.  Patient is up to date with the following cancer screening tests: physical exam, colonoscopy. Saturday morning patient woke up with lower lip swelling and on Friday he had welts under his armpits. This happened after he had [place] food which may have had some pork in it.   Patient has been on lisinopril for over 5 years. No change in dosing. No isolated angioedema episodes.   Rhinitis:  He reports symptoms of headaches, nasal congestion, sneezing, ear congestion, itchy/watery eyes. Symptoms have been going on for 50+ years. The symptoms are present all year around. Other triggers include exposure to none. Anosmia: no. Headache: yes. He has used Human resources officer, zyrtec, Xyzal, Flonase with some improvement in symptoms. Chlorphenamine seems to  work the best. Sinus infections: none. Previous work up includes: yes patient had skin prick testing in the past on multiple times. Last set was about 30 years ago which was positive to dust mites and mold per patient report. Patient was on allergy injections in the past for less than 1 year with unknown benefit.  Previous ENT evaluation: not recently. History of nasal polyps: no. Last eye exam: within the past 2 years. History of reflux: no.  Assessment and Plan: Daniel Blackwell is a 69 y.o. 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 He was doing well but recently had a hive outbreak with lip swelling. Thinks the food he ate may have had pork in it but not sure. Concerned if he's developing new allergies.  Today's skin testing showed: Borderline positive to pork. Negative to environmental allergies. Slightly dermatographic on exam.   Keep track of symptoms and episodes. Get bloodwork as below rule out other etiologies.   Angioedema of lips One episode of lower lip swelling with associated hives the day before. Patient has been on lisinopril for over 5 years. No isolated angioedema episodes.  Keep track of symptoms and episodes.  If there's another episode of lip swelling without the hives then may need to stop lisinopril.  Get bloodwork to rule out other causes.   Allergy to alpha-gal  Continue to avoid red meat - beef, pork lamb.  For mild symptoms you can take over the counter antihistamines such as Benadryl and monitor symptoms closely. If symptoms worsen or if you have severe  symptoms including breathing issues, throat closure, significant swelling, whole body hives, severe diarrhea and vomiting, lightheadedness then inject epinephrine and seek immediate medical care afterwards.  Emergency action plan given.   Recheck alpha-gal level.   Chronic rhinitis Perennial rhino conjunctivitis symptoms for 50+ years. Main  concerns today are the headaches and ear fullness. Skin testing over 30 years ago showed multiple positives and was on AIT for less than 1 year at that time with unknown benefit.   Today's skin testing showed: negative to environmental allergies.   Will double check via bloodwork instead of intradermals due to some mild dermatographism.   May use over the counter antihistamines such as Zyrtec (cetirizine), Claritin (loratadine), Allegra (fexofenadine), or Xyzal (levocetirizine) daily as needed.  May use Flonase (fluticasone) nasal spray 1 spray per nostril twice a day as needed for nasal congestion.   See if it helps with the headaches, earfullness  If no benefit will refer to ENT next.   Nasal saline spray (i.e., Simply Saline) or nasal saline lavage (i.e., NeilMed) is recommended as needed and prior to medicated nasal sprays.  Multiple drug allergies  Continue avoid medications on your allergy list - penicillin, aspiring and sulfa.  Consider penicillin skin testing in future.    Adverse food reaction Cashews caused rash in the past. 2021 bloodwork was negative to cashew.  Today's skin testing was negative to cashew.  Continue to avoid cashew for now.   Will discuss re-introduction at next visit.   Return in about 2 months (around 06/27/2020).  Meds ordered this encounter  Medications  . fluticasone (FLONASE) 50 MCG/ACT nasal spray    Sig: Place 1 spray into both nostrils in the morning and at bedtime.    Dispense:  16 g    Refill:  5    Lab Orders     Allergens w/Total IgE Area 2     Alpha-Gal Panel     ANA w/Reflex     C3 and C4     CBC with Differential/Platelet     C1 esterase inhibitor, functional     C1 Esterase Inhibitor     Complement component c1q     Chronic Urticaria     Comprehensive metabolic panel     Tryptase     Thyroid Cascade Profile  Other allergy screening: Asthma: no Food allergy: yes Cashews cause rash Medication allergy: yes  Aspirin  - lip swelling Hymenoptera allergy: no Eczema:no History of recurrent infections suggestive of immunodeficency: no  Diagnostics: Skin Testing: Environmental allergy panel and select foods. Borderline positive to pork. Results discussed with patient/family.  Airborne Adult Perc - 04/27/20 1416    Time Antigen Placed 1416    Allergen Manufacturer Lavella Hammock    Location Back    Number of Test 59    Panel 1 Select    1. Control-Buffer 50% Glycerol Negative    2. Control-Histamine 1 mg/ml 2+    3. Albumin saline Negative    4. Eunice Negative    5. [place] Negative    6. Dula Negative    7. [place] Blue Negative    8. Meadow Fescue Negative    9. Perennial Rye Negative    10. Sweet Vernal Negative    11. Timothy Negative    12. Cocklebur Negative    13. Burweed Marshelder Negative    14. Ragweed, short Negative    15. Ragweed, Giant Negative    16. Plantain,  English Negative    17. Lamb's Quarters Negative  18. Sheep Sorrell Negative    19. Rough Pigweed Negative    20. Marsh Elder, Rough Negative    21. Mugwort, Common Negative    22. Ash mix Negative    23. Birch mix Negative    24. Beech American Negative    25. Box, Elder Negative    26. Cedar, red Negative    27. Cottonwood, [place] Negative    28. Elm mix Negative    29. Hickory Negative    30. Maple mix Negative    31. Oak, [place] mix Negative    32. Pecan Pollen Negative    33. Pine mix Negative    34. Sycamore Eastern Negative    35. [place], Black Pollen Negative    36. Alternaria alternata Negative    37. Cladosporium Herbarum Negative    38. Aspergillus mix Negative    39. Penicillium mix Negative    40. Bipolaris sorokiniana (Helminthosporium) Negative    41. Drechslera spicifera (Curvularia) Negative    42. Mucor plumbeus Negative    43. Fusarium moniliforme Negative    44. Aureobasidium pullulans (pullulara) Negative    45. Rhizopus oryzae Negative    46. Botrytis cinera Negative    47.  Epicoccum nigrum Negative    48. Phoma betae Negative    49. Candida Albicans Negative    50. Trichophyton mentagrophytes Negative    51. Mite, D Farinae  5,000 AU/ml Negative    52. Mite, D Pteronyssinus  5,000 AU/ml Negative    53. Cat Hair 10,000 BAU/ml Negative    54.  Dog Epithelia Negative    55. Mixed Feathers Negative    56. Horse Epithelia Negative    57. Cockroach, German Negative    58. Mouse Negative    59. Tobacco Leaf Negative          Food Adult Perc - 04/27/20 1400    Time Antigen Placed 1416    Allergen Manufacturer Lavella Hammock    Location Back    Number of allergen test 19    Control-Histamine 1 mg/ml 2+    1. Peanut Negative    2. Soybean Negative    3. Wheat Negative    4. Sesame Negative    5. Milk, cow Negative    6. Egg White, Chicken Negative    7. Casein Negative    8. Shellfish Mix Negative    9. Fish Mix Negative    10. Cashew Negative    25. Shrimp Negative    26. Crab Negative    27. Lobster Negative    28. Oyster Negative    29. Scallops Negative    37. Pork --   -/+   40. Beef Negative           Past Medical History: Patient Active Problem List   Diagnosis Date Noted  . Allergy to alpha-gal 04/27/2020  . Urticaria 04/27/2020  . Chronic rhinitis 04/27/2020  . Angioedema of lips 04/27/2020  . Multiple drug allergies 04/27/2020  . Adverse food reaction 04/27/2020  . Intra-abdominal free air of unknown etiology 08/02/2019  . Allergic reaction to alpha-gal   . Hyperlipidemia   . Hyperglycemia   . Multiple allergies 12/01/2010  . Bipolar 1 disorder (Daniel Blackwell) 12/01/2010  . Hypertension 12/01/2010  . Colon polyps 12/01/2010   Past Medical History:  Diagnosis Date  . Allergic reaction to alpha-gal   . Allergy    mold/yeast  . Bipolar 1 disorder ([place])   . Colon polyps   .  Hyperglycemia   . Hyperlipidemia   . Hypertension    Past Surgical History: Past Surgical History:  Procedure Laterality Date  . EYE SURGERY     laser eye  surgery mar 2018, and april 2018  . LAPAROTOMY N/A 08/02/2019   Procedure: EXPLORATORY LAPAROTOMY, ILEOCECTOMY;  Surgeon: Ralene Ok, MD;  Location: [facility];  Service: General;  Laterality: N/A;   Medication List:  Current Outpatient Medications  Medication Sig Dispense Refill  . acetaminophen (TYLENOL) 325 MG tablet Take 2 tablets (650 mg total) by mouth every 6 (six) hours as needed for mild pain.    Marland Kitchen allopurinol (ZYLOPRIM) 300 MG tablet TAKE 1 TABLET(300 MG) BY MOUTH DAILY 30 tablet 6  . amLODipine (NORVASC) 5 MG tablet Take 1 tablet (5 mg total) by mouth daily. 30 tablet 11  . EPINEPHrine 0.3 mg/0.3 mL IJ SOAJ injection Inject 0.3 mLs (0.3 mg total) into the muscle as needed for anaphylaxis. 1 each 1  . famotidine (PEPCID) 40 MG tablet TAKE 1 TABLET(40 MG) BY MOUTH DAILY 90 tablet 1  . levocetirizine (XYZAL) 5 MG tablet TAKE 1 TABLET(5 MG) BY MOUTH EVERY EVENING 30 tablet 0  . lisinopril (ZESTRIL) 20 MG tablet TAKE 1 TABLET(20 MG) BY MOUTH DAILY 90 tablet 2  . Multiple Vitamin (MULTIVITAMIN) tablet Take 1 tablet by mouth daily.    . Omega-3 Fatty Acids (FISH OIL) 1000 MG CPDR Take 2 capsules by mouth See admin instructions. 2-3 times weekly    . vitamin B-12 (CYANOCOBALAMIN) 1000 MCG tablet Take 1,000 mcg by mouth daily.     Marland Kitchen allopurinol (ZYLOPRIM) 300 MG tablet TAKE 1 TABLET(300 MG) BY MOUTH DAILY 30 tablet 6  . amoxicillin-clavulanate (AUGMENTIN) 875-125 MG tablet Take 1 tablet by mouth every 12 (twelve) hours. (Patient not taking: Reported on 04/27/2020) 14 tablet 0  . aspirin 81 MG tablet Take 81 mg by mouth daily. (Patient not taking: Reported on 04/27/2020)    . colchicine 0.6 MG tablet TAKE 1 TABLET(0.6 MG) BY MOUTH DAILY (Patient not taking: Reported on 04/27/2020) 30 tablet 2  . docusate sodium (COLACE) 100 MG capsule Take 1 capsule (100 mg total) by mouth 2 (two) times daily as needed for mild constipation. 10 capsule 0  . fluticasone (FLONASE) 50 MCG/ACT nasal spray Place 1  spray into both nostrils in the morning and at bedtime. 16 g 5  . indomethacin (INDOCIN) 50 MG capsule Take 1 capsule (50 mg total) by mouth 3 (three) times daily with meals. (Patient not taking: Reported on 04/27/2020) 21 capsule 0  . methocarbamol (ROBAXIN) 500 MG tablet Take 1 tablet (500 mg total) by mouth every 8 (eight) hours as needed for muscle spasms. (Patient not taking: Reported on 04/27/2020) 30 tablet 0  . ondansetron (ZOFRAN) 4 MG tablet Take 1 tablet (4 mg total) by mouth every 6 (six) hours as needed for nausea. (Patient not taking: Reported on 04/27/2020) 20 tablet 0  . oxyCODONE (OXY IR/ROXICODONE) 5 MG immediate release tablet Take 1 tablet (5 mg total) by mouth every 6 (six) hours as needed for breakthrough pain. (Patient not taking: Reported on 04/27/2020) 15 tablet 0   No current facility-administered medications for this visit.   Allergies: Allergies  Allergen Reactions  . Asa [Aspirin] Swelling  . Other Swelling    CASHEWS  . Penicillins Rash    Did it involve swelling of the face/tongue/throat, SOB, or low BP? No Did it involve sudden or severe rash/hives, skin peeling, or any reaction on the  inside of your mouth or nose? No Did you need to seek medical attention at a hospital or doctor's office? No When did it last happen?50 years ago If all above answers are "NO", may proceed with cephalosporin use.   . Sulfa Antibiotics Rash   Social History: Social History   Socioeconomic History  . Marital status: Married    Spouse name: Not on file  . Number of children: Not on file  . Years of education: Not on file  . Highest education level: Not on file  Occupational History  . Occupation: State, Conservation officer, nature    Comment: School System  Tobacco Use  . Smoking status: Former Smoker    Quit date: 04/04/1983    Years since quitting: 37.0  . Smokeless tobacco: Never Used  Substance and Sexual Activity  . Alcohol use: Yes    Alcohol/week: 1.0 standard drink     Types: 1 drink(s) per week  . Drug use: No  . Sexual activity: Not on file  Other Topics Concern  . 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   At certain times of the year he works long hours   but then at certain times of the year works a few hours.   Social Determinants of Health   Financial Resource Strain:   . Difficulty of Paying Living Expenses: Not on file  Food Insecurity:   . Worried About Charity fundraiser in the Last Year: Not on file  . Ran Out of Food in the Last Year: Not on file  Transportation Needs:   . Lack of Transportation (Medical): Not on file  . Lack of Transportation (Non-Medical): Not on file  Physical Activity:   . Days of Exercise per Week: Not on file  . Minutes of Exercise per Session: Not on file  Stress:   . Feeling of Stress : Not on file  Social Connections:   . Frequency of Communication with Friends and Family: Not on file  . Frequency of Social Gatherings with Friends and Family: Not on file  . Attends Religious Services: Not on file  . Active Member of Clubs or Organizations: Not on file  . Attends Archivist Meetings: Not on file  . Marital Status: Not on file   Lives in a 72 year old home. Smoking: denies Occupation: retired  Programme researcher, broadcasting/film/video HistoryFreight forwarder in the house: no Charity fundraiser in the family room: no Carpet in the bedroom: no Heating: heat pump Cooling: central Pet: no  Family History: History reviewed. No pertinent family history. Problem                               Relation Asthma                                   No  Eczema                                No  Food allergy                          No  Allergic rhino conjunctivitis  Daughter   Review of Systems  Constitutional: Negative for appetite change, chills, fever and unexpected weight change.  HENT: Positive for  congestion. Negative for rhinorrhea.   Eyes: Positive for itching.  Respiratory: Negative for cough, chest tightness, shortness of breath and wheezing.   Cardiovascular: Negative for chest pain.  Gastrointestinal: Negative for abdominal pain.  Genitourinary: Negative for difficulty urinating.  Skin: Negative for rash.  Allergic/Immunologic: Positive for food allergies.  Neurological: Positive for headaches.   Objective: BP 128/74   Pulse 78   Temp 98.7 F (37.1 C)   Resp 16   Ht 5' 8.5" (1.74 m)   Wt 216 lb (98 kg)   SpO2 98%   BMI 32.36 kg/m  Body mass index is 32.36 kg/m. Physical Exam Vitals and nursing note reviewed.  Constitutional:      Appearance: Normal appearance. He is well-developed.  HENT:     Head: Normocephalic and atraumatic.     Right Ear: Tympanic membrane and external ear normal.     Left Ear: Tympanic membrane and external ear normal.     Nose: Nose normal.     Mouth/Throat:     Mouth: Mucous membranes are moist.     Pharynx: Oropharynx is clear.  Eyes:     Conjunctiva/sclera: Conjunctivae normal.  Cardiovascular:     Rate and Rhythm: Normal rate and regular rhythm.     Heart sounds: Normal heart sounds. No murmur heard.  No friction rub. No gallop.   Pulmonary:     Effort: Pulmonary effort is normal.     Breath sounds: Normal breath sounds. No wheezing, rhonchi or rales.  Musculoskeletal:     Cervical back: Neck supple.  Skin:    General: Skin is warm.     Findings: No rash.  Neurological:     Mental Status: He is alert and oriented to person, place, and time.  Psychiatric:        Behavior: Behavior normal.    The plan was reviewed with the patient/family, and all questions/concerned were addressed.  It was my pleasure to see Daniel Blackwell today and participate in his care. Please feel free to contact me with any questions or concerns.  Sincerely,  Rexene Alberts, DO Allergy & Immunology  [facility]  office: [place] office: [phone]

## 2020-04-27 NOTE — Assessment & Plan Note (Signed)
One episode of lower lip swelling with associated hives the day before. Patient has been on lisinopril for over 5 years. No isolated angioedema episodes.  Keep track of symptoms and episodes.  If there's another episode of lip swelling without the hives then may need to stop lisinopril.  Get bloodwork to rule out other causes.

## 2020-04-28 LAB — ALPHA-GAL PANEL

## 2020-04-29 LAB — ALPHA-GAL PANEL

## 2020-04-30 ENCOUNTER — Other Ambulatory Visit: Payer: Self-pay

## 2020-04-30 ENCOUNTER — Encounter: Payer: Self-pay | Admitting: Family Medicine

## 2020-04-30 DIAGNOSIS — L509 Urticaria, unspecified: Secondary | ICD-10-CM

## 2020-05-03 LAB — ALPHA-GAL PANEL

## 2020-05-05 ENCOUNTER — Other Ambulatory Visit: Payer: Self-pay

## 2020-05-05 ENCOUNTER — Other Ambulatory Visit: Payer: Medicare PPO

## 2020-05-05 DIAGNOSIS — L509 Urticaria, unspecified: Secondary | ICD-10-CM | POA: Diagnosis not present

## 2020-05-06 LAB — URIC ACID: Uric Acid, Serum: 5.2 mg/dL (ref 4.0–8.0)

## 2020-05-13 ENCOUNTER — Other Ambulatory Visit: Payer: Self-pay | Admitting: Allergy

## 2020-05-13 ENCOUNTER — Other Ambulatory Visit: Payer: Self-pay | Admitting: *Deleted

## 2020-05-13 DIAGNOSIS — L509 Urticaria, unspecified: Secondary | ICD-10-CM

## 2020-05-13 DIAGNOSIS — T783XXD Angioneurotic edema, subsequent encounter: Secondary | ICD-10-CM

## 2020-05-13 LAB — ALLERGENS W/TOTAL IGE AREA 2
Alternaria Alternata IgE: <0.1 kU/L
Aspergillus Fumigatus IgE: <0.1 kU/L
Bermuda Grass IgE: <0.1 kU/L
Cat Dander IgE: 0.12 kU/L — AB
Cedar, Mountain IgE: <0.1 kU/L
Cladosporium Herbarum IgE: <0.1 kU/L
Cockroach, German IgE: <0.1 kU/L
Common Silver Birch IgE: <0.1 kU/L
Cottonwood IgE: <0.1 kU/L
D Farinae IgE: 0.13 kU/L — AB
D Pteronyssinus IgE: <0.1 kU/L
Dog Dander IgE: 0.22 kU/L — AB
Elm, American IgE: <0.1 kU/L
IgE (Immunoglobulin E), Serum: 214 IU/mL (ref 6–495)
Johnson Grass IgE: <0.1 kU/L
Maple/Box Elder IgE: <0.1 kU/L
Mouse Urine IgE: <0.1 kU/L
Oak, White IgE: <0.1 kU/L
Pecan, Hickory IgE: <0.1 kU/L
Penicillium Chrysogen IgE: <0.1 kU/L
Pigweed, Rough IgE: <0.1 kU/L
Ragweed, Short IgE: <0.1 kU/L
Sheep Sorrel IgE Qn: <0.1 kU/L
Timothy Grass IgE: <0.1 kU/L
White Mulberry IgE: <0.1 kU/L

## 2020-05-13 LAB — COMPREHENSIVE METABOLIC PANEL
ALT: 36 IU/L (ref 0–44)
AST: 30 IU/L (ref 0–40)
Albumin/Globulin Ratio: 2.1 (ref 1.2–2.2)
Albumin: 5 g/dL — ABNORMAL HIGH (ref 3.7–4.7)
Alkaline Phosphatase: 121 IU/L (ref 44–121)
BUN/Creatinine Ratio: 15 (ref 10–24)
BUN: 13 mg/dL (ref 8–27)
Bilirubin Total: 0.3 mg/dL (ref 0.0–1.2)
CO2: 24 mmol/L (ref 20–29)
Calcium: 9.7 mg/dL (ref 8.6–10.2)
Chloride: 102 mmol/L (ref 96–106)
Creatinine, Ser: 0.89 mg/dL (ref 0.76–1.27)
GFR calc Af Amer: 99 mL/min/{1.73_m2} (ref >59)
GFR calc non Af Amer: 86 mL/min/{1.73_m2} (ref >59)
Globulin, Total: 2.4 g/dL (ref 1.5–4.5)
Glucose: 93 mg/dL (ref 65–99)
Potassium: 4 mmol/L (ref 3.5–5.2)
Sodium: 142 mmol/L (ref 134–144)
Total Protein: 7.4 g/dL (ref 6.0–8.5)

## 2020-05-13 LAB — ALPHA-GAL PANEL

## 2020-05-13 LAB — CBC WITH DIFFERENTIAL/PLATELET
Basophils Absolute: 0.1 10*3/uL (ref 0.0–0.2)
Basos: 1 %
EOS (ABSOLUTE): 0.2 10*3/uL (ref 0.0–0.4)
Eos: 2 %
Hematocrit: 48.6 % (ref 37.5–51.0)
Hemoglobin: 15.9 g/dL (ref 13.0–17.7)
Immature Grans (Abs): 0 10*3/uL (ref 0.0–0.1)
Immature Granulocytes: 0 %
Lymphocytes Absolute: 3.4 10*3/uL — ABNORMAL HIGH (ref 0.7–3.1)
Lymphs: 33 %
MCH: 29.9 pg (ref 26.6–33.0)
MCHC: 32.7 g/dL (ref 31.5–35.7)
MCV: 91 fL (ref 79–97)
Monocytes Absolute: 0.7 10*3/uL (ref 0.1–0.9)
Monocytes: 7 %
Neutrophils Absolute: 5.8 10*3/uL (ref 1.4–7.0)
Neutrophils: 57 %
Platelets: 204 10*3/uL (ref 150–450)
RBC: 5.32 x10E6/uL (ref 4.14–5.80)
RDW: 13.3 % (ref 11.6–15.4)
WBC: 10.2 10*3/uL (ref 3.4–10.8)

## 2020-05-13 LAB — C1 ESTERASE INHIBITOR: C1INH SerPl-mCnc: 32 mg/dL (ref 21–39)

## 2020-05-13 LAB — CHRONIC URTICARIA

## 2020-05-13 LAB — ANA W/REFLEX: Anti Nuclear Antibody (ANA): NEGATIVE

## 2020-05-13 LAB — THYROID CASCADE PROFILE: TSH: 2.69 u[IU]/mL (ref 0.450–4.500)

## 2020-05-13 LAB — C3 AND C4
Complement C3, Serum: 139 mg/dL (ref 82–167)
Complement C4, Serum: 22 mg/dL (ref 12–38)

## 2020-05-13 LAB — COMPLEMENT COMPONENT C1Q: Complement C1Q: 14.4 mg/dL (ref 10.2–20.3)

## 2020-05-13 LAB — TRYPTASE: Tryptase: 5 ug/L (ref 2.2–13.2)

## 2020-05-13 LAB — C1 ESTERASE INHIBITOR, FUNCTIONAL: C1INH Functional/C1INH Total MFr SerPl: >91 %mean normal

## 2020-05-18 ENCOUNTER — Telehealth: Payer: Self-pay

## 2020-05-18 NOTE — Telephone Encounter (Signed)
Called and spoke to his wife and asked for him to call our office back to discuss his labs being redrawn for Alpha gal. His previous lab work did not have enough serum to test for his Alpha Gal allergy and the machine at the lab was not in working condition to test for all his allergy test. Patient needs to have labs redrawn no later than Thursday, May 21, 2020.

## 2020-05-19 NOTE — Telephone Encounter (Signed)
Patient was advise and will come in to get redrawn.

## 2020-05-23 ENCOUNTER — Other Ambulatory Visit: Payer: Self-pay | Admitting: Family Medicine

## 2020-05-23 DIAGNOSIS — I10 Essential (primary) hypertension: Secondary | ICD-10-CM

## 2020-05-28 ENCOUNTER — Encounter: Payer: Self-pay | Admitting: Podiatry

## 2020-05-28 ENCOUNTER — Ambulatory Visit (INDEPENDENT_AMBULATORY_CARE_PROVIDER_SITE_OTHER): Payer: Medicare PPO

## 2020-05-28 ENCOUNTER — Ambulatory Visit: Payer: Medicare PPO | Admitting: Podiatry

## 2020-05-28 ENCOUNTER — Other Ambulatory Visit: Payer: Self-pay

## 2020-05-28 DIAGNOSIS — L84 Corns and callosities: Secondary | ICD-10-CM

## 2020-05-28 DIAGNOSIS — M79672 Pain in left foot: Secondary | ICD-10-CM

## 2020-05-28 DIAGNOSIS — M779 Enthesopathy, unspecified: Secondary | ICD-10-CM | POA: Diagnosis not present

## 2020-05-28 DIAGNOSIS — M21612 Bunion of left foot: Secondary | ICD-10-CM | POA: Diagnosis not present

## 2020-05-28 DIAGNOSIS — M21622 Bunionette of left foot: Secondary | ICD-10-CM | POA: Diagnosis not present

## 2020-05-28 DIAGNOSIS — M1000 Idiopathic gout, unspecified site: Secondary | ICD-10-CM

## 2020-05-28 MED ORDER — TRIAMCINOLONE ACETONIDE 10 MG/ML IJ SUSP
10.0000 mg | Freq: Once | INTRAMUSCULAR | Status: AC
  Administered 2020-05-28: 10 mg

## 2020-05-29 ENCOUNTER — Ambulatory Visit (INDEPENDENT_AMBULATORY_CARE_PROVIDER_SITE_OTHER): Payer: Medicare PPO | Admitting: Family Medicine

## 2020-05-29 VITALS — BP 130/90 | HR 63 | Temp 97.7°F | Ht 68.0 in | Wt 212.0 lb

## 2020-05-29 DIAGNOSIS — Z0000 Encounter for general adult medical examination without abnormal findings: Secondary | ICD-10-CM

## 2020-05-29 DIAGNOSIS — Z23 Encounter for immunization: Secondary | ICD-10-CM

## 2020-05-29 DIAGNOSIS — R7303 Prediabetes: Secondary | ICD-10-CM | POA: Diagnosis not present

## 2020-05-29 DIAGNOSIS — I10 Essential (primary) hypertension: Secondary | ICD-10-CM

## 2020-05-29 DIAGNOSIS — E7800 Pure hypercholesterolemia, unspecified: Secondary | ICD-10-CM | POA: Diagnosis not present

## 2020-05-29 DIAGNOSIS — Z125 Encounter for screening for malignant neoplasm of prostate: Secondary | ICD-10-CM | POA: Diagnosis not present

## 2020-05-29 DIAGNOSIS — M1000 Idiopathic gout, unspecified site: Secondary | ICD-10-CM

## 2020-05-29 DIAGNOSIS — Z0001 Encounter for general adult medical examination with abnormal findings: Secondary | ICD-10-CM

## 2020-05-29 NOTE — Progress Notes (Signed)
Subjective:    Patient ID: Daniel Blackwell, male    DOB: 06/10/1951, 69 y.o.   MRN: [id number]  HPI  Patient is here today for a complete physical exam.  He had a colonoscopy performed in February of this year where he had a polyp removed near the cecum.  He was admitted to the hospital with peritonitis shortly thereafter.  Patient was taken emergently to the OR by Dr. Rosendo Gros where he was found to have feculent peritonitis, perforated cecum after polypectomy for which he underwent an ileocecectomy.  Due to the size of the polyp, they recommended a repeat colonoscopy in 1 year.  Patient does not feel comfortable going back to the gastroenterologist who performed the last colonoscopy.  He would like to see a different GI doctor in the [facility] system.  However he would like to wait on the referral at this time.  He is due for prostate cancer screening.  He does not smoke and therefore does not require AAA screening or lung cancer screening.  He saw allergy specialist in November who checked a CBC and a CMP which were excellent.  He is due for a fasting lipid panel, PSA, and due to his history of prediabetes and A1c.  Blood pressure today is acceptable at 130/90.  He denies any falls, depression, or memory loss  Immunization History  Administered Date(s) Administered  . Fluad Quad(high Dose 65+) 04/05/2019  . Influenza, High Dose Seasonal PF 10/19/2016, 03/31/2017, 04/14/2018  . Influenza,inj,Quad PF,6+ Mos 04/03/2013, 05/18/2016  . Influenza-Unspecified 03/27/2014, 05/15/2015  . PFIZER SARS-COV-2 Vaccination 07/09/2019  . Pneumococcal Conjugate-13 06/11/2014  . Pneumococcal Polysaccharide-23 01/25/2016  . Td 12/11/2001, 12/09/2011  . Zoster Recombinat (Shingrix) 04/05/2019, 06/05/2019    Past Medical History:  Diagnosis Date  . Allergic reaction to alpha-gal   . Allergy    mold/yeast  . Bipolar 1 disorder ([place])   . Colon polyps   . Hyperglycemia   . Hyperlipidemia   . Hypertension     Past Surgical History:  Procedure Laterality Date  . EYE SURGERY     laser eye surgery mar 2018, and april 2018  . LAPAROTOMY N/A 08/02/2019   Procedure: EXPLORATORY LAPAROTOMY, ILEOCECTOMY;  Surgeon: Ralene Ok, MD;  Location: [facility];  Service: General;  Laterality: N/A;    Allergies  Allergen Reactions  . Asa [Aspirin] Swelling  . Other Swelling    CASHEWS  . Penicillins Rash    Did it involve swelling of the face/tongue/throat, SOB, or low BP? No Did it involve sudden or severe rash/hives, skin peeling, or any reaction on the inside of your mouth or nose? No Did you need to seek medical attention at a hospital or doctor's office? No When did it last happen?50 years ago If all above answers are "NO", may proceed with cephalosporin use.   . Sulfa Antibiotics Rash   Social History   Socioeconomic History  . Marital status: Married    Spouse name: Not on file  . Number of children: Not on file  . Years of education: Not on file  . Highest education level: Not on file  Occupational History  . Occupation: State, Conservation officer, nature    Comment: School System  Tobacco Use  . Smoking status: Former Smoker    Quit date: 04/04/1983    Years since quitting: 37.1  . Smokeless tobacco: Never Used  Substance and Sexual Activity  . Alcohol use: Yes    Alcohol/week: 1.0 standard drink    Types:  1 drink(s) per week  . Drug use: No  . Sexual activity: Not on file  Other Topics Concern  . 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   At certain times of the year he works long hours   but then at certain times of the year works a few hours.   Social Determinants of Health   Financial Resource Strain: Not on file  Food Insecurity: Not on file  Transportation Needs: Not on file  Physical Activity: Not on file  Stress: Not on file  Social Connections: Not  on file  Intimate Partner Violence: Not on file   No family history on file.   Review of Systems  All other systems reviewed and are negative.      Objective:   Physical Exam Vitals reviewed.  Constitutional:      General: He is not in acute distress.    Appearance: Normal appearance. He is obese. He is not ill-appearing, toxic-appearing or diaphoretic.  HENT:     Head: Normocephalic and atraumatic.     Right Ear: Tympanic membrane and ear canal normal. There is no impacted cerumen.     Left Ear: Tympanic membrane and ear canal normal. There is no impacted cerumen.     Nose: Nose normal. No congestion or rhinorrhea.     Mouth/Throat:     Mouth: Mucous membranes are moist.     Pharynx: No oropharyngeal exudate or posterior oropharyngeal erythema.  Eyes:     General: No scleral icterus.       Right eye: No discharge.        Left eye: No discharge.     Extraocular Movements: Extraocular movements intact.     Conjunctiva/sclera: Conjunctivae normal.     Pupils: Pupils are equal, round, and reactive to light.  Neck:     Vascular: No carotid bruit.  Cardiovascular:     Rate and Rhythm: Normal rate and regular rhythm.     Pulses: Normal pulses.     Heart sounds: Normal heart sounds. No murmur heard. No friction rub. No gallop.   Pulmonary:     Effort: Pulmonary effort is normal. No respiratory distress.     Breath sounds: Normal breath sounds. No stridor. No wheezing, rhonchi or rales.  Chest:     Chest wall: No tenderness.  Abdominal:     General: Abdomen is flat. Bowel sounds are normal. There is no distension.     Palpations: Abdomen is soft.     Tenderness: There is no abdominal tenderness. There is no guarding or rebound.  Musculoskeletal:        General: Normal range of motion.     Cervical back: Normal range of motion and neck supple. No rigidity or tenderness.     Right lower leg: No edema.     Left lower leg: No edema.  Lymphadenopathy:     Cervical: No  cervical adenopathy.  Skin:    General: Skin is warm.     Coloration: Skin is not jaundiced or pale.     Findings: No bruising, erythema, lesion or rash.  Neurological:     General: No focal deficit present.     Mental Status: He is alert and oriented to person, place, and time. Mental status is at baseline.     Cranial Nerves: No cranial nerve deficit.  Motor: No weakness.     Coordination: Coordination normal.     Gait: Gait normal.     Deep Tendon Reflexes: Reflexes normal.  Psychiatric:        Mood and Affect: Mood normal.        Behavior: Behavior normal.        Thought Content: Thought content normal.        Judgment: Judgment normal.           Assessment & Plan:  Essential hypertension - Plan: Hemoglobin A1c, Lipid panel, Microalbumin, urine, PSA, Medicare, CANCELED: CBC with Differential/Platelet, CANCELED: COMPLETE METABOLIC PANEL WITH GFR  Pure hypercholesterolemia - Plan: Hemoglobin A1c, Lipid panel, Microalbumin, urine, PSA, Medicare, CANCELED: CBC with Differential/Platelet, CANCELED: COMPLETE METABOLIC PANEL WITH GFR  Prediabetes - Plan: Hemoglobin A1c, Lipid panel, Microalbumin, urine, PSA, Medicare, CANCELED: CBC with Differential/Platelet, CANCELED: COMPLETE METABOLIC PANEL WITH GFR  Idiopathic gout, unspecified chronicity, unspecified site  General medical exam - Plan: Hemoglobin A1c, Lipid panel, Microalbumin, urine, PSA, Medicare, PSA, CANCELED: CBC with Differential/Platelet, CANCELED: COMPLETE METABOLIC PANEL WITH GFR  Prostate cancer screening - Plan: PSA, Medicare, PSA  Need for immunization against influenza - Plan: Flu Vaccine QUAD High Dose(Fluad)  Patient's physical exam is outstanding.  His only abnormality is a surgical scar on his abdomen where he had laparotomy earlier this year.  He denies any issues moving his bowels.  Overall he is doing well.  He denies any falls, depression, or memory loss.  He is already had his Covid vaccines x3.  He  is due for a flu shot today.  The remainder of his vaccines are up-to-date.  I will check an A1c, lipid panel, urine microalbumin, and a PSA.  CBC and CMP were checked earlier this year and were excellent.  Colonoscopy is due again in February 2022.  He will call me when he is ready for me to schedule him to see a GI doctor.  The remainder of his preventative care is up-to-date.  His uric acid checked in November was less than 6 at 5.2

## 2020-05-29 NOTE — Progress Notes (Signed)
Subjective:   Patient ID: Daniel Blackwell, male   DOB: 69 y.o.   MRN: [id number]   HPI Patient presents stating he is developed inflammation and pain around the fifth metatarsal of his left foot and also has history of gout wants to discuss that condition   ROS      Objective:  Physical Exam  Neurovascular status intact with inflammation pain around the fifth MPJ left fluid buildup around the joint surface that is painful with keratotic lesion more acute in nature with history of digital deformities consistent with gout with patient now on 300 mg daily     Assessment:  Inflammatory capsulitis with structural deformity fifth MPJ left with tailor's bunion fluid buildup and lesion along with history of gout     Plan:  H&P reviewed gout continue 300 mg/day and I do not think this is related to it with education given.  Today I went ahead and I did sterile prep and I injected around the fifth MPJ 3 mg Dexasone Kenalog 5 mg Xylocaine debrided the lesion fully and advised on possible metatarsal head resection.  Patient will be seen back to recheck  X-rays indicate enlargement around the fifth metatarsal head left but no indications that the uric acid level has caused problems here but I did note there is changes third and fourth toes at the proximal joint consistent with history of gout

## 2020-05-30 LAB — LIPID PANEL
Cholesterol: 210 mg/dL — ABNORMAL HIGH (ref <200)
HDL: 53 mg/dL (ref >=40)
LDL Cholesterol (Calc): 117 mg/dL (calc) — ABNORMAL HIGH
Non-HDL Cholesterol (Calc): 157 mg/dL (calc) — ABNORMAL HIGH (ref <130)
Total CHOL/HDL Ratio: 4 (calc) (ref <5.0)
Triglycerides: 279 mg/dL — ABNORMAL HIGH (ref <150)

## 2020-05-30 LAB — MICROALBUMIN, URINE: Microalb, Ur: 11.8 mg/dL

## 2020-05-30 LAB — PSA: PSA: 0.21 ng/mL (ref <=4.0)

## 2020-05-30 LAB — HEMOGLOBIN A1C
Hgb A1c MFr Bld: 6.5 % of total Hgb — ABNORMAL HIGH (ref <5.7)
Mean Plasma Glucose: 140 mg/dL
eAG (mmol/L): 7.7 mmol/L

## 2020-06-08 ENCOUNTER — Other Ambulatory Visit: Payer: Self-pay | Admitting: Podiatry

## 2020-06-08 DIAGNOSIS — M21619 Bunion of unspecified foot: Secondary | ICD-10-CM

## 2020-06-26 DIAGNOSIS — H25011 Cortical age-related cataract, right eye: Secondary | ICD-10-CM | POA: Diagnosis not present

## 2020-06-26 DIAGNOSIS — H25043 Posterior subcapsular polar age-related cataract, bilateral: Secondary | ICD-10-CM | POA: Diagnosis not present

## 2020-06-26 DIAGNOSIS — H43392 Other vitreous opacities, left eye: Secondary | ICD-10-CM | POA: Diagnosis not present

## 2020-07-06 ENCOUNTER — Encounter: Payer: Self-pay | Admitting: Family Medicine

## 2020-07-22 ENCOUNTER — Other Ambulatory Visit: Payer: Self-pay | Admitting: Family Medicine

## 2020-07-22 DIAGNOSIS — L509 Urticaria, unspecified: Secondary | ICD-10-CM

## 2020-09-10 DIAGNOSIS — D225 Melanocytic nevi of trunk: Secondary | ICD-10-CM | POA: Diagnosis not present

## 2020-09-10 DIAGNOSIS — L820 Inflamed seborrheic keratosis: Secondary | ICD-10-CM | POA: Diagnosis not present

## 2020-09-10 DIAGNOSIS — L821 Other seborrheic keratosis: Secondary | ICD-10-CM | POA: Diagnosis not present

## 2020-10-11 ENCOUNTER — Ambulatory Visit (HOSPITAL_COMMUNITY)
Admission: RE | Admit: 2020-10-11 | Discharge: 2020-10-11 | Disposition: A | Discharge status: Home / Self Care | Payer: Medicare PPO | Source: Ambulatory Visit | Attending: Emergency Medicine | Admitting: Emergency Medicine

## 2020-10-11 ENCOUNTER — Encounter (HOSPITAL_COMMUNITY): Payer: Self-pay

## 2020-10-11 ENCOUNTER — Ambulatory Visit (INDEPENDENT_AMBULATORY_CARE_PROVIDER_SITE_OTHER): Payer: Medicare PPO

## 2020-10-11 ENCOUNTER — Other Ambulatory Visit: Payer: Self-pay

## 2020-10-11 VITALS — BP 138/86 | HR 66 | Temp 98.1°F | Resp 17

## 2020-10-11 DIAGNOSIS — R058 Other specified cough: Secondary | ICD-10-CM

## 2020-10-11 DIAGNOSIS — R059 Cough, unspecified: Secondary | ICD-10-CM

## 2020-10-11 DIAGNOSIS — R0602 Shortness of breath: Secondary | ICD-10-CM | POA: Diagnosis not present

## 2020-10-11 MED ORDER — BENZONATATE 100 MG PO CAPS: 200.0000 mg | ORAL_CAPSULE | Freq: Three times a day (TID) | ORAL | 0 refills | Status: DC | PRN

## 2020-10-11 MED ORDER — PROMETHAZINE-DM 6.25-15 MG/5ML PO SYRP: 5.0000 mL | ORAL_SOLUTION | Freq: Four times a day (QID) | ORAL | 0 refills | Status: DC | PRN

## 2020-10-11 NOTE — ED Triage Notes (Signed)
Pt presents with productive cough, congestion, and nasal drainage X 10 days that is unrelieved with OTC medication.

## 2020-10-11 NOTE — Discharge Instructions (Signed)
You most likely have post-viral cough syndrome.   You can take the tessalon perles as needed for cough.  You can take the Promethazine DM at night as needed for cough.   You can take Tylenol and/or Ibuprofen as needed for fever reduction and pain relief.   For cough: honey 1/2 to 1 teaspoon (you can dilute the honey in water or another fluid).  You can use a humidifier for chest congestion and cough.  If you don't have a humidifier, you can sit in the bathroom with the hot shower running.    For sore throat: try warm salt water gargles, cepacol lozenges, throat spray, warm tea or water with lemon/honey, popsicles or ice, or OTC cold relief medicine for throat discomfort.    For congestion: take a daily anti-histamine like Zyrtec, Claritin, and a oral decongestant to help with post nasal drip that may be irritating your throat.    It is important to stay hydrated: drink plenty of fluids (water, gatorade/powerade/pedialyte, juices, or teas) to keep your throat moisturized and help further relieve irritation/discomfort.   Return or go to the Emergency Department if symptoms worsen or do not improve in the next few days.

## 2020-10-11 NOTE — ED Provider Notes (Signed)
[place]    CSN: [id number] Arrival date & time: 10/11/20  1626      History   Chief Complaint Chief Complaint  Patient presents with  . APPOINTMENT: URI    HPI Daniel Blackwell is a 70 y.o. male.   Patient here for evaluation of cough and chest and nasal congestion for for the past 10 days.  Reports taking OTC medications with some relief, most recently using UP&UP day and night cold and sinus.  Reports cough and congestion lingering.  Reports cough worse in the morning and coughing up thick yellow/green mucus that is occasionally blood-tinged.  Denies any fevers at home. Denies any trauma, injury, or other precipitating event. Denies any fevers, chest pain, shortness of breath, N/V/D, numbness, tingling, weakness, abdominal pain, or headaches.   ROS: As per HPI, all other pertinent ROS negative   The history is provided by the patient.    Past Medical History:  Diagnosis Date  . Allergic reaction to alpha-gal   . Allergy    mold/yeast  . Bipolar 1 disorder ([place])   . Colon polyps   . Hyperglycemia   . Hyperlipidemia   . Hypertension     Patient Active Problem List   Diagnosis Date Noted  . Allergy to alpha-gal 04/27/2020  . Urticaria 04/27/2020  . Chronic rhinitis 04/27/2020  . Angioedema of lips 04/27/2020  . Multiple drug allergies 04/27/2020  . Adverse food reaction 04/27/2020  . Intra-abdominal free air of unknown etiology 08/02/2019  . Allergic reaction to alpha-gal   . Hyperlipidemia   . Hyperglycemia   . Multiple allergies 12/01/2010  . Bipolar 1 disorder ([place]) 12/01/2010  . Hypertension 12/01/2010  . Colon polyps 12/01/2010    Past Surgical History:  Procedure Laterality Date  . EYE SURGERY     laser eye surgery mar 2018, and april 2018  . LAPAROTOMY N/A 08/02/2019   Procedure: EXPLORATORY LAPAROTOMY, ILEOCECTOMY;  Surgeon: Ralene Ok, MD;  Location: [facility];  Service: General;  Laterality: N/A;       Home Medications     Prior to Admission medications   Medication Sig Start Date End Date Taking? Authorizing Provider  benzonatate (TESSALON PERLES) 100 MG capsule Take 2 capsules (200 mg total) by mouth 3 (three) times daily as needed for cough. 10/11/20  Yes Pearson Forster, NP  promethazine-dextromethorphan (PROMETHAZINE-DM) 6.25-15 MG/5ML syrup Take 5 mLs by mouth 4 (four) times daily as needed for cough. 10/11/20  Yes Pearson Forster, NP  acetaminophen (TYLENOL) 325 MG tablet Take 2 tablets (650 mg total) by mouth every 6 (six) hours as needed for mild pain. 08/07/19   Maczis, Barth Kirks, PA-C  allopurinol (ZYLOPRIM) 300 MG tablet TAKE 1 TABLET(300 MG) BY MOUTH DAILY 11/07/19   Susy Frizzle, MD  allopurinol (ZYLOPRIM) 300 MG tablet TAKE 1 TABLET(300 MG) BY MOUTH DAILY Patient not taking: Reported on 05/29/2020 05/26/20   Susy Frizzle, MD  amLODipine (NORVASC) 5 MG tablet TAKE 1 TABLET(5 MG) BY MOUTH DAILY 05/26/20   Susy Frizzle, MD  amLODipine (NORVASC) 5 MG tablet TAKE 1 TABLET(5 MG) BY MOUTH DAILY Patient not taking: Reported on 05/29/2020 05/26/20   Susy Frizzle, MD  aspirin 81 MG tablet Take 81 mg by mouth daily.    [provider]  colchicine 0.6 MG tablet TAKE 1 TABLET(0.6 MG) BY MOUTH DAILY Patient not taking: No sig reported 01/25/19   Susy Frizzle, MD  docusate sodium (COLACE) 100 MG capsule  Take 1 capsule (100 mg total) by mouth 2 (two) times daily as needed for mild constipation. 08/07/19   Maczis, Barth Kirks, PA-C  EPINEPHrine 0.3 mg/0.3 mL IJ SOAJ injection Inject 0.3 mLs (0.3 mg total) into the muscle as needed for anaphylaxis. 07/19/19   Susy Frizzle, MD  famotidine (PEPCID) 40 MG tablet TAKE 1 TABLET(40 MG) BY MOUTH DAILY 07/22/20   Susy Frizzle, MD  fluticasone [facility]) 50 MCG/ACT nasal spray Place 1 spray into both nostrils in the morning and at bedtime. 04/27/20   Garnet Sierras, DO  indomethacin (INDOCIN) 50 MG capsule Take 1 capsule (50 mg total) by mouth 3  (three) times daily with meals. Patient not taking: No sig reported 09/17/18   Susy Frizzle, MD  levocetirizine (XYZAL) 5 MG tablet TAKE 1 TABLET(5 MG) BY MOUTH EVERY EVENING 02/25/20   Susy Frizzle, MD  lisinopril (ZESTRIL) 20 MG tablet TAKE 1 TABLET(20 MG) BY MOUTH DAILY 05/26/20   Susy Frizzle, MD  methocarbamol (ROBAXIN) 500 MG tablet Take 1 tablet (500 mg total) by mouth every 8 (eight) hours as needed for muscle spasms. Patient not taking: No sig reported 08/07/19   Jillyn Ledger, PA-C  Multiple Vitamin (MULTIVITAMIN) tablet Take 1 tablet by mouth daily.    [provider]  Omega-3 Fatty Acids (FISH OIL) 1000 MG CPDR Take 2 capsules by mouth See admin instructions. 2-3 times weekly    [provider]  ondansetron (ZOFRAN) 4 MG tablet Take 1 tablet (4 mg total) by mouth every 6 (six) hours as needed for nausea. Patient not taking: No sig reported 08/07/19   Jillyn Ledger, PA-C  oxyCODONE (OXY IR/ROXICODONE) 5 MG immediate release tablet Take 1 tablet (5 mg total) by mouth every 6 (six) hours as needed for breakthrough pain. Patient not taking: No sig reported 08/07/19   Jillyn Ledger, PA-C  vitamin B-12 (CYANOCOBALAMIN) 1000 MCG tablet Take 1,000 mcg by mouth daily.     [provider]    Family History Family History  Family history unknown: Yes    Social History Social History   Tobacco Use  . Smoking status: Former Smoker    Quit date: 04/04/1983    Years since quitting: 37.5  . Smokeless tobacco: Never Used  Substance Use Topics  . Alcohol use: Yes    Alcohol/week: 1.0 standard drink    Types: 1 drink(s) per week  . Drug use: No     Allergies   Asa [aspirin], Other, Penicillins, and Sulfa antibiotics   Review of Systems Review of Systems  Respiratory: Positive for cough.   All other systems reviewed and are negative.    Physical Exam Triage Vital Signs ED Triage Vitals  Enc Vitals Group     BP 10/11/20 1655  138/86     Pulse Rate 10/11/20 1655 66     Resp 10/11/20 1655 17     Temp 10/11/20 1655 98.1 F (36.7 C)     Temp Source 10/11/20 1655 Oral     SpO2 10/11/20 1655 98 %     Weight --      Height --      Head Circumference --      Peak Flow --      Pain Score 10/11/20 1654 3     Pain Loc --      Pain Edu? --      Excl. in [place]? --    No data found.  Updated  Vital Signs BP 138/86 (BP Location: Right Arm)   Pulse 66   Temp 98.1 F (36.7 C) (Oral)   Resp 17   SpO2 98%   Visual Acuity Right Eye Distance:   Left Eye Distance:   Bilateral Distance:    Right Eye Near:   Left Eye Near:    Bilateral Near:     Physical Exam Vitals and nursing note reviewed.  Constitutional:      General: He is not in acute distress.    Appearance: Normal appearance. He is not ill-appearing, toxic-appearing or diaphoretic.  HENT:     Head: Normocephalic and atraumatic.  Eyes:     Conjunctiva/sclera: Conjunctivae normal.  Cardiovascular:     Rate and Rhythm: Normal rate.     Pulses: Normal pulses.     Heart sounds: Normal heart sounds.  Pulmonary:     Effort: Pulmonary effort is normal.     Breath sounds: Normal breath sounds.  Abdominal:     General: Abdomen is flat.     Palpations: Abdomen is soft.  Musculoskeletal:        General: Normal range of motion.     Cervical back: Normal range of motion.  Skin:    General: Skin is warm and dry.  Neurological:     General: No focal deficit present.     Mental Status: He is alert and oriented to person, place, and time.  Psychiatric:        Mood and Affect: Mood normal.      UC Treatments / Results  Labs (all labs ordered are listed, but only abnormal results are displayed) Labs Reviewed - No data to display  EKG   Radiology DG Chest 2 View  Result Date: 10/11/2020 CLINICAL DATA:  Shortness of breath and cough. EXAM: CHEST - 2 VIEW COMPARISON:  None. FINDINGS: The cardiomediastinal silhouette is unremarkable. There is no  evidence of focal airspace disease, pulmonary edema, suspicious pulmonary nodule/mass, pleural effusion, or pneumothorax. No acute bony abnormalities are identified. IMPRESSION: No active cardiopulmonary disease. Electronically Signed   By: Margarette Canada M.D.   On: 10/11/2020 17:34    Procedures Procedures (including critical care time)  Medications Ordered in UC Medications - No data to display  Initial Impression / Assessment and Plan / UC Course  I have reviewed the triage vital signs and the nursing notes.  Pertinent labs & imaging results that were available during my care of the patient were reviewed by me and considered in my medical decision making (see chart for details).    Assessment negative for red flags or concerns.  X-ray with no active cardiopulmonary disease.  This is most likely postviral cough syndrome.  Prescribed Tessalon Perles and Promethazine DM as needed.  Explained that cough can linger for 4 to 6 weeks.  May continue to take Claritin or Zyrtec.  May also continue Flonase.  Conservative symptom management discussed gust as described below in discharge instructions.  Follow-up with primary care as needed.   Final Clinical Impressions(s) / UC Diagnoses   Final diagnoses:  Post-viral cough syndrome  Cough     Discharge Instructions     You most likely have post-viral cough syndrome.   You can take the tessalon perles as needed for cough.  You can take the Promethazine DM at night as needed for cough.   You can take Tylenol and/or Ibuprofen as needed for fever reduction and pain relief.   For cough: honey 1/2 to 1 teaspoon (you  can dilute the honey in water or another fluid).  You can use a humidifier for chest congestion and cough.  If you don't have a humidifier, you can sit in the bathroom with the hot shower running.    For sore throat: try warm salt water gargles, cepacol lozenges, throat spray, warm tea or water with lemon/honey, popsicles or ice, or OTC  cold relief medicine for throat discomfort.    For congestion: take a daily anti-histamine like Zyrtec, Claritin, and a oral decongestant to help with post nasal drip that may be irritating your throat.    It is important to stay hydrated: drink plenty of fluids (water, gatorade/powerade/pedialyte, juices, or teas) to keep your throat moisturized and help further relieve irritation/discomfort.   Return or go to the Emergency Department if symptoms worsen or do not improve in the next few days.      ED Prescriptions    Medication Sig Dispense Auth. Provider   benzonatate (TESSALON PERLES) 100 MG capsule Take 2 capsules (200 mg total) by mouth 3 (three) times daily as needed for cough. 20 capsule Pearson Forster, NP   promethazine-dextromethorphan (PROMETHAZINE-DM) 6.25-15 MG/5ML syrup Take 5 mLs by mouth 4 (four) times daily as needed for cough. 118 mL Pearson Forster, NP     PDMP not reviewed this encounter.   Pearson Forster, NP 10/11/20 1750

## 2020-11-02 ENCOUNTER — Telehealth: Payer: Self-pay | Admitting: Internal Medicine

## 2020-11-02 NOTE — Telephone Encounter (Signed)
Pt called looking to schedule colon. He had colon last February at [facility] and was recommended repeat colon in 1 year. I was  Not able to find records in Epic so pt will have them fax over. He would like to see Dr. Hilarie Fredrickson because his wife had her procedure with him.

## 2020-11-04 NOTE — Telephone Encounter (Signed)
Good morning Dr. Hilarie Fredrickson, patient called wanting to schedule a colonoscopy with you.  Had colon last year.  Will send records to you.  Can you please review and advise on scheduling?  Thank you.

## 2020-11-04 NOTE — Telephone Encounter (Signed)
I have reviewed the colonoscopy performed by Dr. 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

## 2020-11-04 NOTE — Telephone Encounter (Signed)
I have no way to get to this other than having patient to sign a release and getting it that way. This is a procedure that was completed at [facility] and is stored in their "media" tab which we do not have any access to. Wyatt Portela- please tell patient we must have his colonoscopy and pathology report before agreeing to take him on as a patient.

## 2020-11-04 NOTE — Telephone Encounter (Signed)
This should be okay, but I would like to see the colonoscopy report and pathology report  I can see it was done via care everywhere but I do not see the report or path report Daniel Blackwell, can you help please Thanks JMP

## 2020-11-04 NOTE — Telephone Encounter (Signed)
Report was received today; given to [facility].  Will be placed on your desk shortly.

## 2020-11-04 NOTE — Telephone Encounter (Signed)
Daniel Blackwell Tell him I will very likely be able to help with next colonoscopy but I need the report 1st He will need to sign a release Thanks JMP

## 2020-11-05 ENCOUNTER — Encounter: Payer: Self-pay | Admitting: Internal Medicine

## 2020-11-05 NOTE — Telephone Encounter (Signed)
Patient is scheduled for previsit and colonoscopy with Dr Hilarie Fredrickson.

## 2020-11-19 ENCOUNTER — Other Ambulatory Visit: Payer: Self-pay | Admitting: Family Medicine

## 2021-01-11 ENCOUNTER — Other Ambulatory Visit: Payer: Self-pay

## 2021-01-11 ENCOUNTER — Ambulatory Visit (AMBULATORY_SURGERY_CENTER): Payer: Self-pay

## 2021-01-11 VITALS — Ht 68.0 in | Wt 216.0 lb

## 2021-01-11 DIAGNOSIS — Z86010 Personal history of colonic polyps: Secondary | ICD-10-CM

## 2021-01-11 MED ORDER — NA SULFATE-K SULFATE-MG SULF 17.5-3.13-1.6 GM/177ML PO SOLN: 1.0000 | Freq: Once | ORAL | 0 refills | Status: AC

## 2021-01-11 NOTE — Progress Notes (Signed)
No egg or soy allergy known to patient  No issues with past sedation with any surgeries or procedures Patient denies ever being told they had issues or difficulty with intubation  No FH of Malignant Hyperthermia No diet pills per patient No home 02 use per patient  No blood thinners per patient  Pt denies issues with constipation  No A fib or A flutter  EMMI video to pt or via [place] 19 guidelines implemented in PV today with Pt and RN  Pt is fully vaccinated  for Covid   Pt had bowel perforation after colonoscopy in February of 2021, went to the ER and states MD had placed a clip in the cecum with the 20 mm polyp removal and then bowel perforated and he had a 103 degree temperature, emergency surgery at [facility].  NO PA's for preps discussed with pt In PV today  Discussed with pt there will be an out-of-pocket cost for prep and that varies from $0 to 70 dollars   Due to the COVID-19 pandemic we are asking patients to follow certain guidelines.  Pt aware of COVID protocols and [facility] guidelines

## 2021-01-25 ENCOUNTER — Ambulatory Visit (AMBULATORY_SURGERY_CENTER): Payer: Medicare PPO | Admitting: Internal Medicine

## 2021-01-25 ENCOUNTER — Other Ambulatory Visit: Payer: Self-pay

## 2021-01-25 ENCOUNTER — Encounter: Payer: Self-pay | Admitting: Internal Medicine

## 2021-01-25 VITALS — BP 121/74 | HR 59 | Temp 97.5°F | Resp 13

## 2021-01-25 DIAGNOSIS — E785 Hyperlipidemia, unspecified: Secondary | ICD-10-CM | POA: Diagnosis not present

## 2021-01-25 DIAGNOSIS — D122 Benign neoplasm of ascending colon: Secondary | ICD-10-CM

## 2021-01-25 DIAGNOSIS — D124 Benign neoplasm of descending colon: Secondary | ICD-10-CM

## 2021-01-25 DIAGNOSIS — K635 Polyp of colon: Secondary | ICD-10-CM | POA: Diagnosis not present

## 2021-01-25 DIAGNOSIS — D127 Benign neoplasm of rectosigmoid junction: Secondary | ICD-10-CM

## 2021-01-25 DIAGNOSIS — I10 Essential (primary) hypertension: Secondary | ICD-10-CM | POA: Diagnosis not present

## 2021-01-25 DIAGNOSIS — D128 Benign neoplasm of rectum: Secondary | ICD-10-CM

## 2021-01-25 DIAGNOSIS — Z86010 Personal history of colonic polyps: Secondary | ICD-10-CM | POA: Diagnosis not present

## 2021-01-25 MED ORDER — SODIUM CHLORIDE 0.9 % IV SOLN: 500.0000 mL | Freq: Once | INTRAVENOUS | Status: DC

## 2021-01-25 NOTE — Op Note (Signed)
[place] Patient Name: Daniel Blackwell Procedure Date: 01/25/2021 10:47 AM MRN: [id number] Endoscopist: Jerene Bears , MD Age: 70 Referring MD:  Date of Birth: 07-May-1951 Gender: Male Account #: [id number] Procedure:                Colonoscopy Indications:              High risk colon cancer surveillance: Personal                            history of adenoma with villous component of cecum                            removed by hot snare in piecemeal fashion at last                            colonoscopy: February 2021; post-procedure delayed                            perforation requiring exploratory laparotomy and                            ileocecectomy (2 days after colonoscopy) Medicines:                Monitored Anesthesia Care Procedure:                Pre-Anesthesia Assessment:                           - Prior to the procedure, a History and Physical                            was performed, and patient medications and                            allergies were reviewed. The patient's tolerance of                            previous anesthesia was also reviewed. The risks                            and benefits of the procedure and the sedation                            options and risks were discussed with the patient.                            All questions were answered, and informed consent                            was obtained. Prior Anticoagulants: The patient has                            taken no previous anticoagulant or antiplatelet  agents. ASA Grade Assessment: II - A patient with                            mild systemic disease. After reviewing the risks                            and benefits, the patient was deemed in                            satisfactory condition to undergo the procedure.                           After obtaining informed consent, the colonoscope                            was passed under direct  vision. Throughout the                            procedure, the patient's blood pressure, pulse, and                            oxygen saturations were monitored continuously. The                            Colonoscope was introduced through the anus and                            advanced to the terminal ileum. The colonoscopy was                            performed without difficulty. The patient tolerated                            the procedure well. The quality of the bowel                            preparation was good. The terminal ileum, ileocecal                            valve, appendiceal orifice, and rectum were                            photographed. Scope In: 11:23:18 AM Scope Out: 11:46:33 AM Scope Withdrawal Time: 0 hours 19 minutes 42 seconds  Total Procedure Duration: 0 hours 23 minutes 15 seconds  Findings:                 The digital rectal exam was normal.                           There was evidence of a prior end-to-end                            ileo-colonic anastomosis in the ascending colon.  This was patent and was characterized by healthy                            appearing mucosa and appeared to have preserved the                            native IC valve.                           The terminal ileum appeared normal.                           A 4 mm polyp was found in the ascending colon. The                            polyp was sessile. The polyp was removed with a                            cold snare. Resection and retrieval were complete.                           A 7 mm polyp was found in the descending colon. The                            polyp was sessile. The polyp was removed with a                            cold snare. Resection and retrieval were complete.                           Three sessile polyps were found in the                            recto-sigmoid colon and distal sigmoid colon. The                             polyps were 3 to 8 mm in size. These polyps were                            removed with a cold snare. Resection and retrieval                            were complete.                           A 3 mm polyp was found in the rectum. The polyp was                            sessile. The polyp was removed with a cold snare.                            Resection and retrieval were complete.  A few small-mouthed diverticula were found in the                            sigmoid colon.                           Internal hemorrhoids were found during                            retroflexion. The hemorrhoids were small. Complications:            No immediate complications. Estimated Blood Loss:     Estimated blood loss was minimal. Impression:               - Patent end-to-end ileo-colonic anastomosis,                            characterized by healthy appearing mucosa.                           - The examined portion of the ileum was normal.                           - One 4 mm polyp in the ascending colon, removed                            with a cold snare. Resected and retrieved.                           - One 7 mm polyp in the descending colon, removed                            with a cold snare. Resected and retrieved.                           - Three 3 to 8 mm polyps at the recto-sigmoid colon                            and in the distal sigmoid colon, removed with a                            cold snare. Resected and retrieved.                           - One 3 mm polyp in the rectum, removed with a cold                            snare. Resected and retrieved.                           - Mild diverticulosis in the sigmoid colon.                           - Small internal hemorrhoids. Recommendation:           -  Patient has a contact number available for                            emergencies. The signs and symptoms of potential                             delayed complications were discussed with the                            patient. Return to normal activities tomorrow.                            Written discharge instructions were provided to the                            patient.                           - Resume previous diet.                           - Continue present medications.                           - Await pathology results.                           - Repeat colonoscopy is recommended for                            surveillance. The colonoscopy date will be                            determined after pathology results from today's                            exam become available for review. Jerene Bears, MD 01/25/2021 11:57:17 AM This report has been signed electronically.

## 2021-01-25 NOTE — Progress Notes (Signed)
PT taken to PACU. Monitors in place. VSS. Report given to RN. 

## 2021-01-25 NOTE — Progress Notes (Signed)
VS taken by C.W. 

## 2021-01-25 NOTE — Progress Notes (Signed)
Patient ID: Daniel Blackwell, male   DOB: [birth year], 70 y.o.   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  The nature of the procedure, as well as the risks, benefits, and alternatives were carefully and thoroughly reviewed with the patient. Ample time for discussion and questions allowed. The patient understood, was satisfied, and agreed to proceed.     HPI: Daniel Blackwell is a 70 y.o. male who presents for surveillance colonoscopy.  History as below.  Last colonoscopy performed in 2021 by Dr. Erlene Quan and surveillance colonoscopy at this time due to piecemeal polypectomy of a 20 mm tubulovillous adenoma of the cecum.  2 days after this colonoscopy with hot snare polypectomy he had abdominal pain and was admitted with a perforated cecum.  He underwent an ileocecectomy with Dr. Rosendo Gros.  No current complaints including chest pain, dyspnea or abdominal pain.  He tolerated the prep well.  Past Medical History:  Diagnosis Date   Allergic reaction to alpha-gal 12/2019   can't eat beef   Allergy    mold/yeast, bee sting   Arthritis 2019   GOUT   Bipolar 1 disorder ([place]) 1996   pt states 25 years ago, pt states not a problem   Colon polyps    Hyperglycemia    Hyperlipidemia    Hypertension     Past Surgical History:  Procedure Laterality Date   COLON SURGERY  08/02/2019   Ileocectomy after colonoscopy perforated his bowel.   COLONOSCOPY  07/2019   20 mm polyp , clip placed, bowel perforated at home, pt went to ER and had Ileocectomy   EYE SURGERY     laser eye surgery mar 2018, and april 2018, torn retina   LAPAROTOMY N/A 08/02/2019   Procedure: EXPLORATORY LAPAROTOMY, ILEOCECTOMY;  Surgeon: Ralene Ok, MD;  Location: [facility];  Service: General;  Laterality: N/A;    Prior to Admission medications   Medication Sig Start Date End Date Taking? Authorizing Provider  acetaminophen (TYLENOL) 325 MG tablet Take 2 tablets (650 mg total) by mouth every 6 (six) hours as needed for mild pain. 08/07/19   Maczis, Barth Kirks, PA-C  allopurinol (ZYLOPRIM) 300 MG tablet TAKE 1 TABLET(300 MG) BY MOUTH DAILY 11/19/20   Susy Frizzle, MD  amLODipine (NORVASC) 5 MG tablet TAKE 1 TABLET(5 MG) BY MOUTH DAILY 05/26/20   Susy Frizzle, MD  amLODipine (NORVASC) 5 MG tablet TAKE 1 TABLET(5 MG) BY MOUTH DAILY 05/26/20   Susy Frizzle, MD  aspirin 81 MG tablet Take 81 mg by mouth daily.    [provider]  benzonatate (TESSALON PERLES) 100 MG capsule Take 2 capsules (200 mg total) by mouth 3 (three) times daily as needed for cough. 10/11/20   Pearson Forster, NP  colchicine 0.6 MG tablet TAKE 1 TABLET(0.6 MG) BY MOUTH DAILY 01/25/19   Susy Frizzle, MD  docusate sodium (COLACE) 100 MG capsule Take 1 capsule (100 mg total) by mouth 2 (two) times daily as needed for mild constipation. 08/07/19   Maczis, Barth Kirks, PA-C  EPINEPHrine 0.3 mg/0.3 mL IJ SOAJ injection Inject 0.3 mLs (0.3 mg total) into the muscle as needed for anaphylaxis. 07/19/19   Susy Frizzle,  MD  famotidine (PEPCID) 40 MG tablet TAKE 1 TABLET(40 MG) BY MOUTH DAILY 07/22/20   Susy Frizzle, MD  fluticasone [facility]) 50 MCG/ACT nasal spray Place 1 spray into both nostrils in the morning and at bedtime. 04/27/20   Garnet Sierras, DO  indomethacin (INDOCIN) 50 MG capsule Take 1 capsule (50 mg total) by mouth 3 (three) times daily with meals. 09/17/18   Susy Frizzle, MD  levocetirizine (XYZAL) 5 MG tablet TAKE 1 TABLET(5 MG) BY MOUTH EVERY EVENING 02/25/20   Susy Frizzle, MD  lisinopril (ZESTRIL) 20 MG tablet TAKE 1 TABLET(20 MG) BY MOUTH DAILY 05/26/20   Susy Frizzle, MD  methocarbamol (ROBAXIN) 500 MG tablet Take 1 tablet  (500 mg total) by mouth every 8 (eight) hours as needed for muscle spasms. Patient not taking: No sig reported 08/07/19   Jillyn Ledger, PA-C  Multiple Vitamin (MULTIVITAMIN) tablet Take 1 tablet by mouth daily.    [provider]  Omega-3 Fatty Acids (FISH OIL) 1000 MG CPDR Take 2 capsules by mouth See admin instructions. 2-3 times weekly    [provider]  ondansetron (ZOFRAN) 4 MG tablet Take 1 tablet (4 mg total) by mouth every 6 (six) hours as needed for nausea. 08/07/19   Maczis, Barth Kirks, PA-C  oxyCODONE (OXY IR/ROXICODONE) 5 MG immediate release tablet Take 1 tablet (5 mg total) by mouth every 6 (six) hours as needed for breakthrough pain. 08/07/19   Maczis, Barth Kirks, PA-C  promethazine-dextromethorphan (PROMETHAZINE-DM) 6.25-15 MG/5ML syrup Take 5 mLs by mouth 4 (four) times daily as needed for cough. 10/11/20   Pearson Forster, NP  vitamin B-12 (CYANOCOBALAMIN) 1000 MCG tablet Take 1,000 mcg by mouth daily.     [provider]    Current Outpatient Medications  Medication Sig Dispense Refill   acetaminophen (TYLENOL) 325 MG tablet Take 2 tablets (650 mg total) by mouth every 6 (six) hours as needed for mild pain.     allopurinol (ZYLOPRIM) 300 MG tablet TAKE 1 TABLET(300 MG) BY MOUTH DAILY 30 tablet 6   amLODipine (NORVASC) 5 MG tablet TAKE 1 TABLET(5 MG) BY MOUTH DAILY 30 tablet 11   amLODipine (NORVASC) 5 MG tablet TAKE 1 TABLET(5 MG) BY MOUTH DAILY 30 tablet 11   aspirin 81 MG tablet Take 81 mg by mouth daily.     benzonatate (TESSALON PERLES) 100 MG capsule Take 2 capsules (200 mg total) by mouth 3 (three) times daily as needed for cough. 20 capsule 0   colchicine 0.6 MG tablet TAKE 1 TABLET(0.6 MG) BY MOUTH DAILY 30 tablet 2   docusate sodium (COLACE) 100 MG capsule Take 1 capsule (100 mg total) by mouth 2 (two) times daily as needed for mild constipation. 10 capsule 0   EPINEPHrine 0.3 mg/0.3 mL IJ SOAJ injection Inject 0.3 mLs (0.3 mg total) into the  muscle as needed for anaphylaxis. 1 each 1   famotidine (PEPCID) 40 MG tablet TAKE 1 TABLET(40 MG) BY MOUTH DAILY 90 tablet 1   fluticasone (FLONASE) 50 MCG/ACT nasal spray Place 1 spray into both nostrils in the morning and at bedtime. 16 g 5   indomethacin (INDOCIN) 50 MG capsule Take 1 capsule (50 mg total) by mouth 3 (three) times daily with meals. 21 capsule 0   levocetirizine (XYZAL) 5 MG tablet TAKE 1 TABLET(5 MG) BY MOUTH EVERY EVENING 30 tablet 0   lisinopril (ZESTRIL) 20 MG tablet TAKE 1 TABLET(20 MG) BY MOUTH DAILY 90 tablet  2   methocarbamol (ROBAXIN) 500 MG tablet Take 1 tablet (500 mg total) by mouth every 8 (eight) hours as needed for muscle spasms. (Patient not taking: No sig reported) 30 tablet 0   Multiple Vitamin (MULTIVITAMIN) tablet Take 1 tablet by mouth daily.     Omega-3 Fatty Acids (FISH OIL) 1000 MG CPDR Take 2 capsules by mouth See admin instructions. 2-3 times weekly     ondansetron (ZOFRAN) 4 MG tablet Take 1 tablet (4 mg total) by mouth every 6 (six) hours as needed for nausea. 20 tablet 0   oxyCODONE (OXY IR/ROXICODONE) 5 MG immediate release tablet Take 1 tablet (5 mg total) by mouth every 6 (six) hours as needed for breakthrough pain. 15 tablet 0   promethazine-dextromethorphan (PROMETHAZINE-DM) 6.25-15 MG/5ML syrup Take 5 mLs by mouth 4 (four) times daily as needed for cough. 118 mL 0   vitamin B-12 (CYANOCOBALAMIN) 1000 MCG tablet Take 1,000 mcg by mouth daily.      No current facility-administered medications for this visit.    Allergies as of 01/25/2021 - Review Complete 01/11/2021  Allergen Reaction Noted   Asa [aspirin] Swelling 04/03/2013   Other Swelling 04/03/2013   Penicillins Rash 12/01/2010   Sulfa antibiotics Rash 12/01/2010    Family History  Problem Relation Age of Onset   Colon cancer Neg Hx    Colon polyps Neg Hx    Esophageal cancer Neg Hx    Rectal cancer Neg Hx    Stomach cancer Neg Hx     Social History   Socioeconomic History    Marital status: Married    Spouse name: Not on file   Number of children: Not on file   Years of education: Not on file   Highest education level: Not on file  Occupational History   Occupation: State, Conservation officer, nature    Comment: School System  Tobacco Use   Smoking status: Former    Types: Pipe    Quit date: 1970    Years since quitting: 52.6    Passive exposure: Never   Smokeless tobacco: Never  Vaping Use   Vaping Use: Never used  Substance and Sexual Activity   Alcohol use: Yes    Alcohol/week: 1.0 standard drink    Types: 1 drink(s) per week   Drug use: Never   Sexual activity: Yes  Other Topics Concern   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   At certain times of the year he works long hours   but then at certain times of the year works a few hours.   Social Determinants of Health   Financial Resource Strain: Not on file  Food Insecurity: Not on file  Transportation Needs: Not on file  Physical Activity: Not on file  Stress: Not on file  Social Connections: Not on file  Intimate Partner Violence: Not on file    Physical Exam: Vital signs in last 24 hours: '@There'$  were no vitals taken for this visit. GEN: NAD EYE: Sclerae anicteric ENT: MMM CV: Non-tachycardic Pulm: CTA b/l GI: Soft, NT/ND NEURO:  Alert & Oriented x 3   Zenovia Jarred, MD [facility]  01/25/2021 10:50 AM

## 2021-01-25 NOTE — Patient Instructions (Addendum)
Handouts were given to your care partner on polyps, diverticulosis and hemorrhoids. You may resume your current medications today. Await biopsy results.  May take 1-3 weeks to receive pathology results. Please call if any questions or concerns.      YOU HAD AN ENDOSCOPIC PROCEDURE TODAY AT [facility]:   Refer to the procedure report that was given to you for any specific questions about what was found during the examination.  If the procedure report does not answer your questions, please call your gastroenterologist to clarify.  If you requested that your care partner not be given the details of your procedure findings, then the procedure report has been included in a sealed envelope for you to review at your convenience later.  YOU SHOULD EXPECT: Some feelings of bloating in the abdomen. Passage of more gas than usual.  Walking can help get rid of the air that was put into your GI tract during the procedure and reduce the bloating. If you had a lower endoscopy (such as a colonoscopy or flexible sigmoidoscopy) you may notice spotting of blood in your stool or on the toilet paper. If you underwent a bowel prep for your procedure, you may not have a normal bowel movement for a few days.  Please Note:  You might notice some irritation and congestion in your nose or some drainage.  This is from the oxygen used during your procedure.  There is no need for concern and it should clear up in a day or so.  SYMPTOMS TO REPORT IMMEDIATELY:  Following lower endoscopy (colonoscopy or flexible sigmoidoscopy):  Excessive amounts of blood in the stool  Significant tenderness or worsening of abdominal pains  Swelling of the abdomen that is new, acute  Fever of 100F or higher  For urgent or emergent issues, a gastroenterologist can be reached at any hour by calling [phone]. Do not use MyChart messaging for urgent concerns.    DIET:  We do recommend a small meal at first, but then  you may proceed to your regular diet.  Drink plenty of fluids but you should avoid alcoholic beverages for 24 hours.  ACTIVITY:  You should plan to take it easy for the rest of today and you should NOT DRIVE or use heavy machinery until tomorrow (because of the sedation medicines used during the test).    FOLLOW UP: Our staff will call the number listed on your records 48-72 hours following your procedure to check on you and address any questions or concerns that you may have regarding the information given to you following your procedure. If we do not reach you, we will leave a message.  We will attempt to reach you two times.  During this call, we will ask if you have developed any symptoms of COVID 19. If you develop any symptoms (ie: fever, flu-like symptoms, shortness of breath, cough etc.) before then, please call [phone].  If you test positive for Covid 19 in the 2 weeks post procedure, please call and report this information to [place].    If any biopsies were taken you will be contacted by phone or by letter within the next 1-3 weeks.  Please call us at [phone] if you have not heard about the biopsies in 3 weeks.    SIGNATURES/CONFIDENTIALITY: You and/or your care partner have signed paperwork which will be entered into your electronic medical record.  These signatures attest to the fact that that the information above on your After Visit  Summary has been reviewed and is understood.  Full responsibility of the confidentiality of this discharge information lies with you and/or your care-partner.  

## 2021-01-25 NOTE — Progress Notes (Signed)
Called to room to assist during endoscopic procedure.  Patient ID and intended procedure confirmed with present staff. Received instructions for my participation in the procedure from the performing physician.  

## 2021-01-25 NOTE — Progress Notes (Signed)
Pt's states no medical or surgical changes since previsit or office visit. 

## 2021-01-27 ENCOUNTER — Telehealth: Payer: Self-pay

## 2021-01-27 NOTE — Telephone Encounter (Signed)
  Follow up Call-  Call back number 01/25/2021  Post procedure Call Back phone  # [phone]  Permission to leave phone message Yes  Some recent data might be hidden     Patient questions:  Do you have a fever, pain , or abdominal swelling? No. Pain Score  0 *  Have you tolerated food without any problems? Yes.    Have you been able to return to your normal activities? Yes.    Do you have any questions about your discharge instructions: Diet   No. Medications  No. Follow up visit  No.  Do you have questions or concerns about your Care? No.  Actions: * If pain score is 4 or above: No action needed, pain <4.

## 2021-02-01 ENCOUNTER — Encounter: Payer: Self-pay | Admitting: Internal Medicine

## 2021-02-17 ENCOUNTER — Other Ambulatory Visit: Payer: Self-pay | Admitting: Family Medicine

## 2021-02-17 DIAGNOSIS — I10 Essential (primary) hypertension: Secondary | ICD-10-CM

## 2021-03-09 ENCOUNTER — Other Ambulatory Visit: Payer: Self-pay | Admitting: Allergy

## 2021-04-08 ENCOUNTER — Other Ambulatory Visit: Payer: Self-pay

## 2021-04-08 ENCOUNTER — Ambulatory Visit (INDEPENDENT_AMBULATORY_CARE_PROVIDER_SITE_OTHER): Payer: Medicare PPO | Admitting: *Deleted

## 2021-04-08 DIAGNOSIS — Z23 Encounter for immunization: Secondary | ICD-10-CM | POA: Diagnosis not present

## 2021-05-18 ENCOUNTER — Other Ambulatory Visit: Payer: Self-pay | Admitting: Family Medicine

## 2021-06-02 ENCOUNTER — Telehealth: Payer: Self-pay | Admitting: Family Medicine

## 2021-06-02 NOTE — Telephone Encounter (Signed)
Left message for patient to call back and schedule Medicare Annual Wellness Visit (AWV) in office.   If not able to come in office, please offer to do virtually or by telephone.  Left office number and my jabber [phone].  Last AWV:05/29/2020  Please schedule at anytime with Nurse Health Advisor.

## 2021-06-08 ENCOUNTER — Telehealth: Payer: Self-pay | Admitting: Family Medicine

## 2021-06-08 DIAGNOSIS — E7800 Pure hypercholesterolemia, unspecified: Secondary | ICD-10-CM

## 2021-06-08 DIAGNOSIS — Z8739 Personal history of other diseases of the musculoskeletal system and connective tissue: Secondary | ICD-10-CM

## 2021-06-08 DIAGNOSIS — Z0000 Encounter for general adult medical examination without abnormal findings: Secondary | ICD-10-CM

## 2021-06-08 DIAGNOSIS — R7303 Prediabetes: Secondary | ICD-10-CM

## 2021-06-08 DIAGNOSIS — I10 Essential (primary) hypertension: Secondary | ICD-10-CM

## 2021-06-08 NOTE — Telephone Encounter (Signed)
Patient called to schedule fasting lab appt for 06/21/21 and cpe for 06/25/21. Patient wants to know if provider needs to add any labs to the tests he usually receives for his annual physical. Please adjust order and advise at [phone].

## 2021-06-08 NOTE — Telephone Encounter (Signed)
CMP, CBC, Lipid and Hgb A1C have been ordered. PSA done last year and will not be covered unless pt has hx of PCx or urinary s/s.  Please advise, thanks!

## 2021-06-21 ENCOUNTER — Other Ambulatory Visit: Payer: Self-pay

## 2021-06-21 ENCOUNTER — Other Ambulatory Visit: Payer: Medicare PPO

## 2021-06-21 DIAGNOSIS — E7800 Pure hypercholesterolemia, unspecified: Secondary | ICD-10-CM | POA: Diagnosis not present

## 2021-06-21 DIAGNOSIS — Z8739 Personal history of other diseases of the musculoskeletal system and connective tissue: Secondary | ICD-10-CM

## 2021-06-21 DIAGNOSIS — Z0000 Encounter for general adult medical examination without abnormal findings: Secondary | ICD-10-CM

## 2021-06-21 DIAGNOSIS — R7303 Prediabetes: Secondary | ICD-10-CM | POA: Diagnosis not present

## 2021-06-21 DIAGNOSIS — I10 Essential (primary) hypertension: Secondary | ICD-10-CM | POA: Diagnosis not present

## 2021-06-21 DIAGNOSIS — Z125 Encounter for screening for malignant neoplasm of prostate: Secondary | ICD-10-CM | POA: Diagnosis not present

## 2021-06-21 NOTE — Telephone Encounter (Signed)
Pt came in to office for lab draw and is asking for Uric Acid added to his labs. Please advise, thanks!

## 2021-06-21 NOTE — Telephone Encounter (Signed)
Orders placed.

## 2021-06-22 ENCOUNTER — Encounter: Payer: Self-pay | Admitting: Family Medicine

## 2021-06-25 ENCOUNTER — Encounter: Payer: Self-pay | Admitting: Family Medicine

## 2021-06-25 ENCOUNTER — Ambulatory Visit (INDEPENDENT_AMBULATORY_CARE_PROVIDER_SITE_OTHER): Payer: Medicare PPO | Admitting: Family Medicine

## 2021-06-25 ENCOUNTER — Other Ambulatory Visit: Payer: Self-pay

## 2021-06-25 VITALS — BP 130/78 | HR 65 | Temp 97.9°F | Resp 16 | Ht 68.0 in | Wt 213.0 lb

## 2021-06-25 DIAGNOSIS — E118 Type 2 diabetes mellitus with unspecified complications: Secondary | ICD-10-CM | POA: Diagnosis not present

## 2021-06-25 DIAGNOSIS — I10 Essential (primary) hypertension: Secondary | ICD-10-CM

## 2021-06-25 DIAGNOSIS — Z125 Encounter for screening for malignant neoplasm of prostate: Secondary | ICD-10-CM

## 2021-06-25 DIAGNOSIS — E7800 Pure hypercholesterolemia, unspecified: Secondary | ICD-10-CM | POA: Diagnosis not present

## 2021-06-25 DIAGNOSIS — Z0000 Encounter for general adult medical examination without abnormal findings: Secondary | ICD-10-CM | POA: Diagnosis not present

## 2021-06-25 MED ORDER — AMLODIPINE BESYLATE 5 MG PO TABS: ORAL_TABLET | ORAL | 5 refills | Status: DC

## 2021-06-25 MED ORDER — ROSUVASTATIN CALCIUM 10 MG PO TABS: 10.0000 mg | ORAL_TABLET | Freq: Every day | ORAL | 3 refills | Status: DC

## 2021-06-25 NOTE — Progress Notes (Signed)
Subjective:    Patient ID: Daniel Blackwell, male    DOB: 09/03/1950, 71 y.o.   MRN: [id number]  HPI 05/29/20 Patient is here today for a complete physical exam.  He had a colonoscopy performed in February of this year where he had a polyp removed near the cecum.  He was admitted to the hospital with peritonitis shortly thereafter.  Patient was taken emergently to the OR by Dr. Rosendo Gros where he was found to have feculent peritonitis, perforated cecum after polypectomy for which he underwent an ileocecectomy.  Due to the size of the polyp, they recommended a repeat colonoscopy in 1 year.  Patient does not feel comfortable going back to the gastroenterologist who performed the last colonoscopy.  He would like to see a different GI doctor in the [facility] system.  However he would like to wait on the referral at this time.  He is due for prostate cancer screening.  He does not smoke and therefore does not require AAA screening or lung cancer screening.  He saw allergy specialist in November who checked a CBC and a CMP which were excellent.  He is due for a fasting lipid panel, PSA, and due to his history of prediabetes and A1c.  Blood pressure today is acceptable at 130/90.  He denies any falls, depression, or memory loss   06/25/21 Patient had repeat colonoscopy in August 2022.  Several polyps were removed.  4 of the polyps were tubular adenomas.  GI recommended a repeat colonoscopy in 3 years.  Overall he is doing very well.  His most recent lab work is listed below which shows diabetes with an A1c of 6.6 and hyperlipidemia with an LDL cholesterol of 117.  We had a long discussion today about the fact that he is not on a statin.  I calculated his 10-year risk of cardiovascular disease to be approximately 15% and a lifetime risk of 42%.  This would justify taking a statin.  Patient agrees.  Otherwise he is doing well with no concerns.  He denies any falls, depression, memory loss.  He has had the new  COVID-vaccine.  He is also had shingles vaccine and the pneumonia shot.  He is due for tetanus shot which he politely declines Lab on 06/21/2021  Component Date Value Ref Range Status   Hgb A1c MFr Bld 06/21/2021 6.6 (H)  <5.7 % of total Hgb Final   Comment: For someone without known diabetes, a hemoglobin A1c value of 6.5% or greater indicates that they may have  diabetes and this should be confirmed with a follow-up  test. . For someone with known diabetes, a value <7% indicates  that their diabetes is well controlled and a value  greater than or equal to 7% indicates suboptimal  control. A1c targets should be individualized based on  duration of diabetes, age, comorbid conditions, and  other considerations. . Currently, no consensus exists regarding use of hemoglobin A1c for diagnosis of diabetes for children. .    Mean Plasma Glucose 06/21/2021 143  mg/dL Final   eAG (mmol/L) 06/21/2021 7.9  mmol/L Final   WBC 06/21/2021 9.6  3.8 - 10.8 Thousand/uL Final   RBC 06/21/2021 5.25  4.20 - 5.80 Million/uL Final   Hemoglobin 06/21/2021 16.3  13.2 - 17.1 g/dL Final   HCT 06/21/2021 48.6  38.5 - 50.0 % Final   MCV 06/21/2021 92.6  80.0 - 100.0 fL Final   MCH 06/21/2021 31.0  27.0 - 33.0 pg Final  MCHC 06/21/2021 33.5  32.0 - 36.0 g/dL Final   RDW 06/21/2021 13.1  11.0 - 15.0 % Final   Platelets 06/21/2021 178  140 - 400 Thousand/uL Final   MPV 06/21/2021 11.0  7.5 - 12.5 fL Final   Neutro Abs 06/21/2021 5,050  1,500 - 7,800 cells/uL Final   Lymphs Abs 06/21/2021 3,427  850 - 3,900 cells/uL Final   Absolute Monocytes 06/21/2021 682  200 - 950 cells/uL Final   Eosinophils Absolute 06/21/2021 374  15 - 500 cells/uL Final   Basophils Absolute 06/21/2021 67  0 - 200 cells/uL Final   Neutrophils Relative % 06/21/2021 52.6  % Final   Total Lymphocyte 06/21/2021 35.7  % Final   Monocytes Relative 06/21/2021 7.1  % Final   Eosinophils Relative 06/21/2021 3.9  % Final   Basophils Relative  06/21/2021 0.7  % Final   Glucose, Bld 06/21/2021 126 (H)  65 - 99 mg/dL Final   Comment: .            Fasting reference interval . For someone without known diabetes, a glucose value >125 mg/dL indicates that they may have diabetes and this should be confirmed with a follow-up test. .    BUN 06/21/2021 14  7 - 25 mg/dL Final   Creat 06/21/2021 0.93  0.70 - 1.28 mg/dL Final   BUN/Creatinine Ratio 46/96/2952 NOT APPLICABLE  6 - 22 (calc) Final   Sodium 06/21/2021 139  135 - 146 mmol/L Final   Potassium 06/21/2021 4.3  3.5 - 5.3 mmol/L Final   Chloride 06/21/2021 101  98 - 110 mmol/L Final   CO2 06/21/2021 28  20 - 32 mmol/L Final   Calcium 06/21/2021 9.4  8.6 - 10.3 mg/dL Final   Total Protein 06/21/2021 6.9  6.1 - 8.1 g/dL Final   Albumin 06/21/2021 4.4  3.6 - 5.1 g/dL Final   Globulin 06/21/2021 2.5  1.9 - 3.7 g/dL (calc) Final   AG Ratio 06/21/2021 1.8  1.0 - 2.5 (calc) Final   Total Bilirubin 06/21/2021 0.6  0.2 - 1.2 mg/dL Final   Alkaline phosphatase (APISO) 06/21/2021 85  35 - 144 U/L Final   AST 06/21/2021 17  10 - 35 U/L Final   ALT 06/21/2021 22  9 - 46 U/L Final   Cholesterol 06/21/2021 209 (H)  <200 mg/dL Final   HDL 06/21/2021 51  > OR = 40 mg/dL Final   Triglycerides 06/21/2021 287 (H)  <150 mg/dL Final   Comment: . If a non-fasting specimen was collected, consider repeat triglyceride testing on a fasting specimen if clinically indicated.  Yates Decamp et al. J. of Clin. Lipidol. 8413;2:440-102. Marland Kitchen    LDL Cholesterol (Calc) 06/21/2021 117 (H)  mg/dL (calc) Final   Comment: Reference range: <100 . Desirable range <100 mg/dL for primary prevention;   <70 mg/dL for patients with CHD or diabetic patients  with > or = 2 CHD risk factors. Marland Kitchen LDL-C is now calculated using the Martin-Hopkins  calculation, which is a validated novel method providing  better accuracy than the Friedewald equation in the  estimation of LDL-C.  Cresenciano Genre et al. Annamaria Helling. 7253;664(40): 2061-2068   (http://education.[facility].com/faq/FAQ164)    Total CHOL/HDL Ratio 06/21/2021 4.1  <5.0 (calc) Final   Non-HDL Cholesterol (Calc) 06/21/2021 158 (H)  <130 mg/dL (calc) Final   Comment: For patients with diabetes plus 1 major ASCVD risk  factor, treating to a non-HDL-C goal of <100 mg/dL  (LDL-C of <70 mg/dL) is considered a therapeutic  option.    Uric Acid, Serum 06/21/2021 5.6  4.0 - 8.0 mg/dL Final   Comment: Therapeutic target for gout patients: <6.0 mg/dL .    TEST NAME: 06/21/2021 URIC ACID   Final   TEST CODE: 06/21/2021 [id number]   Final   CLIENT CONTACT: 06/21/2021 Learta Codding   Final   REPORT ALWAYS MESSAGE SIGNATURE 06/21/2021    Final   Comment: . The laboratory testing on this patient was verbally requested or confirmed by the ordering physician or his or her authorized representative after contact with an employee of [employer]. Federal regulations require that we maintain on file written authorization for all laboratory testing.  Accordingly we are asking that the ordering physician or his or her authorized representative sign a copy of this report and promptly return it to the client service representative. . . Signature:____________________________________________________ . Please fax this signed page to [phone] or return it via your [employer] courier.     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   COLONOSCOPY  07/2019   20 mm polyp , clip placed, bowel perforated at home, pt went to ER and had Ileocectomy   EYE SURGERY     laser eye surgery mar 2018, and april 2018, torn retina   LAPAROTOMY N/A 08/02/2019   Procedure: EXPLORATORY LAPAROTOMY, ILEOCECTOMY;  Surgeon: Ralene Ok, MD;  Location: [place];  Service: General;  Laterality: N/A;    Allergies  Allergen Reactions   Asa [Aspirin] Swelling   Other Swelling    CASHEWS   Penicillins Rash    Did it involve swelling of the face/tongue/throat, SOB, or low BP? No Did it involve sudden or severe rash/hives, skin peeling, or any reaction on the inside of your mouth or nose? No Did you need to seek medical attention at a hospital or doctor's office? No When did it last happen?    50 years ago   If all above answers are NO, may proceed with cephalosporin use.    Sulfa Antibiotics Rash   Social History   Socioeconomic History   Marital status: Married    Spouse name: Not on file   Number of children: Not on file   Years of education: Not on file   Highest education level: Not on file  Occupational History   Occupation: [employer]    Comment: School System  Tobacco Use   Smoking status: Former    Types: Pipe    Quit date: 1970    Years since quitting: 53.0    Passive exposure: Never   Smokeless tobacco: Never  Vaping  Use   Vaping Use: Never used  Substance and Sexual Activity   Alcohol use: Yes    Alcohol/week: 1.0 standard drink    Types: 1 drink(s) per week   Drug use: Never   Sexual activity: Yes  Other Topics  Concern   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   At certain times of the year he works long hours   but then at certain times of the year works a few hours.   Social Determinants of Health   Financial Resource Strain: Not on file  Food Insecurity: Not on file  Transportation Needs: Not on file  Physical Activity: Not on file  Stress: Not on file  Social Connections: Not on file  Intimate Partner Violence: Not on file   Family History  Problem Relation Age of Onset   Colon cancer Neg Hx    Colon polyps Neg Hx    Esophageal cancer Neg Hx    Rectal cancer Neg Hx    Stomach cancer Neg Hx      Review of Systems  All other systems reviewed and are negative.     Objective:   Physical Exam Vitals reviewed.  Constitutional:      General: He is not in acute distress.    Appearance: Normal appearance. He is obese. He is not ill-appearing, toxic-appearing or diaphoretic.  HENT:     Head: Normocephalic and atraumatic.     Right Ear: Tympanic membrane and ear canal normal. There is no impacted cerumen.     Left Ear: Tympanic membrane and ear canal normal. There is no impacted cerumen.     Nose: Nose normal. No congestion or rhinorrhea.     Mouth/Throat:     Mouth: Mucous membranes are moist.     Pharynx: No oropharyngeal exudate or posterior oropharyngeal erythema.  Eyes:     General: No scleral icterus.       Right eye: No discharge.        Left eye: No discharge.     Extraocular Movements: Extraocular movements intact.     Conjunctiva/sclera: Conjunctivae normal.     Pupils: Pupils are equal, round, and reactive to light.  Neck:     Vascular: No carotid bruit.  Cardiovascular:     Rate and Rhythm: Normal rate and regular rhythm.     Pulses: Normal pulses.     Heart sounds: Normal heart sounds. No murmur heard.   No friction rub. No gallop.   Pulmonary:     Effort: Pulmonary effort is normal. No respiratory distress.     Breath sounds: Normal breath sounds. No stridor. No wheezing, rhonchi or rales.  Chest:     Chest wall: No tenderness.  Abdominal:     General: Abdomen is flat. Bowel sounds are normal. There is no distension.     Palpations: Abdomen is soft.     Tenderness: There is no abdominal tenderness. There is no guarding or rebound.  Musculoskeletal:        General: Normal range of motion.     Cervical back: Normal range of motion and neck supple. No rigidity or tenderness.     Right lower leg: No edema.     Left lower leg: No edema.  Lymphadenopathy:     Cervical: No cervical adenopathy.  Skin:    General: Skin is  warm.     Coloration: Skin is not jaundiced or pale.     Findings: No bruising, erythema, lesion or rash.  Neurological:     General: No focal deficit present.     Mental Status: He is alert and oriented to person, place, and time. Mental status is at baseline.     Cranial Nerves: No cranial nerve deficit.     Motor: No weakness.     Coordination: Coordination normal.     Gait: Gait normal.     Deep Tendon Reflexes: Reflexes normal.  Psychiatric:        Mood and Affect: Mood normal.        Behavior: Behavior normal.        Thought Content: Thought content normal.        Judgment: Judgment normal.          Assessment & Plan:  General medical exam  Essential hypertension - Plan: amLODipine (NORVASC) 5 MG tablet  Pure hypercholesterolemia  Prostate cancer screening  Controlled type 2 diabetes mellitus with complication, without long-term current use of insulin ([place]) Patient's colonoscopy is up-to-date and is not due again for 3 years.  He is due for prostate cancer screening so we will add a PSA to his lab work.  Immunizations are up-to-date except for tetanus shot which he politely declines.  Diabetes is borderline.  Recommend a low carbohydrate diet and exercise.  However I strongly  encouraged the patient to start taking a statin.  He agrees to take Crestor 10 mg a day.  Recheck lab work in 6 months.

## 2021-06-27 ENCOUNTER — Encounter: Payer: Self-pay | Admitting: Family Medicine

## 2021-06-28 LAB — CBC WITH DIFFERENTIAL/PLATELET
Absolute Monocytes: 682 cells/uL (ref 200–950)
Basophils Absolute: 67 cells/uL (ref 0–200)
Basophils Relative: 0.7 %
Eosinophils Absolute: 374 cells/uL (ref 15–500)
Eosinophils Relative: 3.9 %
HCT: 48.6 % (ref 38.5–50.0)
Hemoglobin: 16.3 g/dL (ref 13.2–17.1)
Lymphs Abs: 3427 cells/uL (ref 850–3900)
MCH: 31 pg (ref 27.0–33.0)
MCHC: 33.5 g/dL (ref 32.0–36.0)
MCV: 92.6 fL (ref 80.0–100.0)
MPV: 11 fL (ref 7.5–12.5)
Monocytes Relative: 7.1 %
Neutro Abs: 5050 cells/uL (ref 1500–7800)
Neutrophils Relative %: 52.6 %
Platelets: 178 10*3/uL (ref 140–400)
RBC: 5.25 10*6/uL (ref 4.20–5.80)
RDW: 13.1 % (ref 11.0–15.0)
Total Lymphocyte: 35.7 %
WBC: 9.6 10*3/uL (ref 3.8–10.8)

## 2021-06-28 LAB — TEST AUTHORIZATION 3

## 2021-06-28 LAB — LIPID PANEL
Cholesterol: 209 mg/dL — ABNORMAL HIGH (ref <200)
HDL: 51 mg/dL (ref >=40)
LDL Cholesterol (Calc): 117 mg/dL (calc) — ABNORMAL HIGH
Non-HDL Cholesterol (Calc): 158 mg/dL (calc) — ABNORMAL HIGH (ref <130)
Total CHOL/HDL Ratio: 4.1 (calc) (ref <5.0)
Triglycerides: 287 mg/dL — ABNORMAL HIGH (ref <150)

## 2021-06-28 LAB — HEMOGLOBIN A1C
Hgb A1c MFr Bld: 6.6 % of total Hgb — ABNORMAL HIGH (ref <5.7)
Mean Plasma Glucose: 143 mg/dL
eAG (mmol/L): 7.9 mmol/L

## 2021-06-28 LAB — URIC ACID: Uric Acid, Serum: 5.6 mg/dL (ref 4.0–8.0)

## 2021-06-28 LAB — COMPREHENSIVE METABOLIC PANEL
AG Ratio: 1.8 (calc) (ref 1.0–2.5)
ALT: 22 U/L (ref 9–46)
AST: 17 U/L (ref 10–35)
Albumin: 4.4 g/dL (ref 3.6–5.1)
Alkaline phosphatase (APISO): 85 U/L (ref 35–144)
BUN: 14 mg/dL (ref 7–25)
CO2: 28 mmol/L (ref 20–32)
Calcium: 9.4 mg/dL (ref 8.6–10.3)
Chloride: 101 mmol/L (ref 98–110)
Creat: 0.93 mg/dL (ref 0.70–1.28)
Globulin: 2.5 g/dL (calc) (ref 1.9–3.7)
Glucose, Bld: 126 mg/dL — ABNORMAL HIGH (ref 65–99)
Potassium: 4.3 mmol/L (ref 3.5–5.3)
Sodium: 139 mmol/L (ref 135–146)
Total Bilirubin: 0.6 mg/dL (ref 0.2–1.2)
Total Protein: 6.9 g/dL (ref 6.1–8.1)

## 2021-06-28 LAB — TEST AUTHORIZATION

## 2021-06-28 LAB — PSA: PSA: 0.22 ng/mL (ref <=4.00)

## 2021-07-02 IMAGING — DX DG CHEST 2V
2 series · 2 of 2 positions shown · non-contrast
Comparison: None.

CLINICAL DATA: Shortness of breath and cough.

EXAM:
CHEST - 2 VIEW

[chest pa]
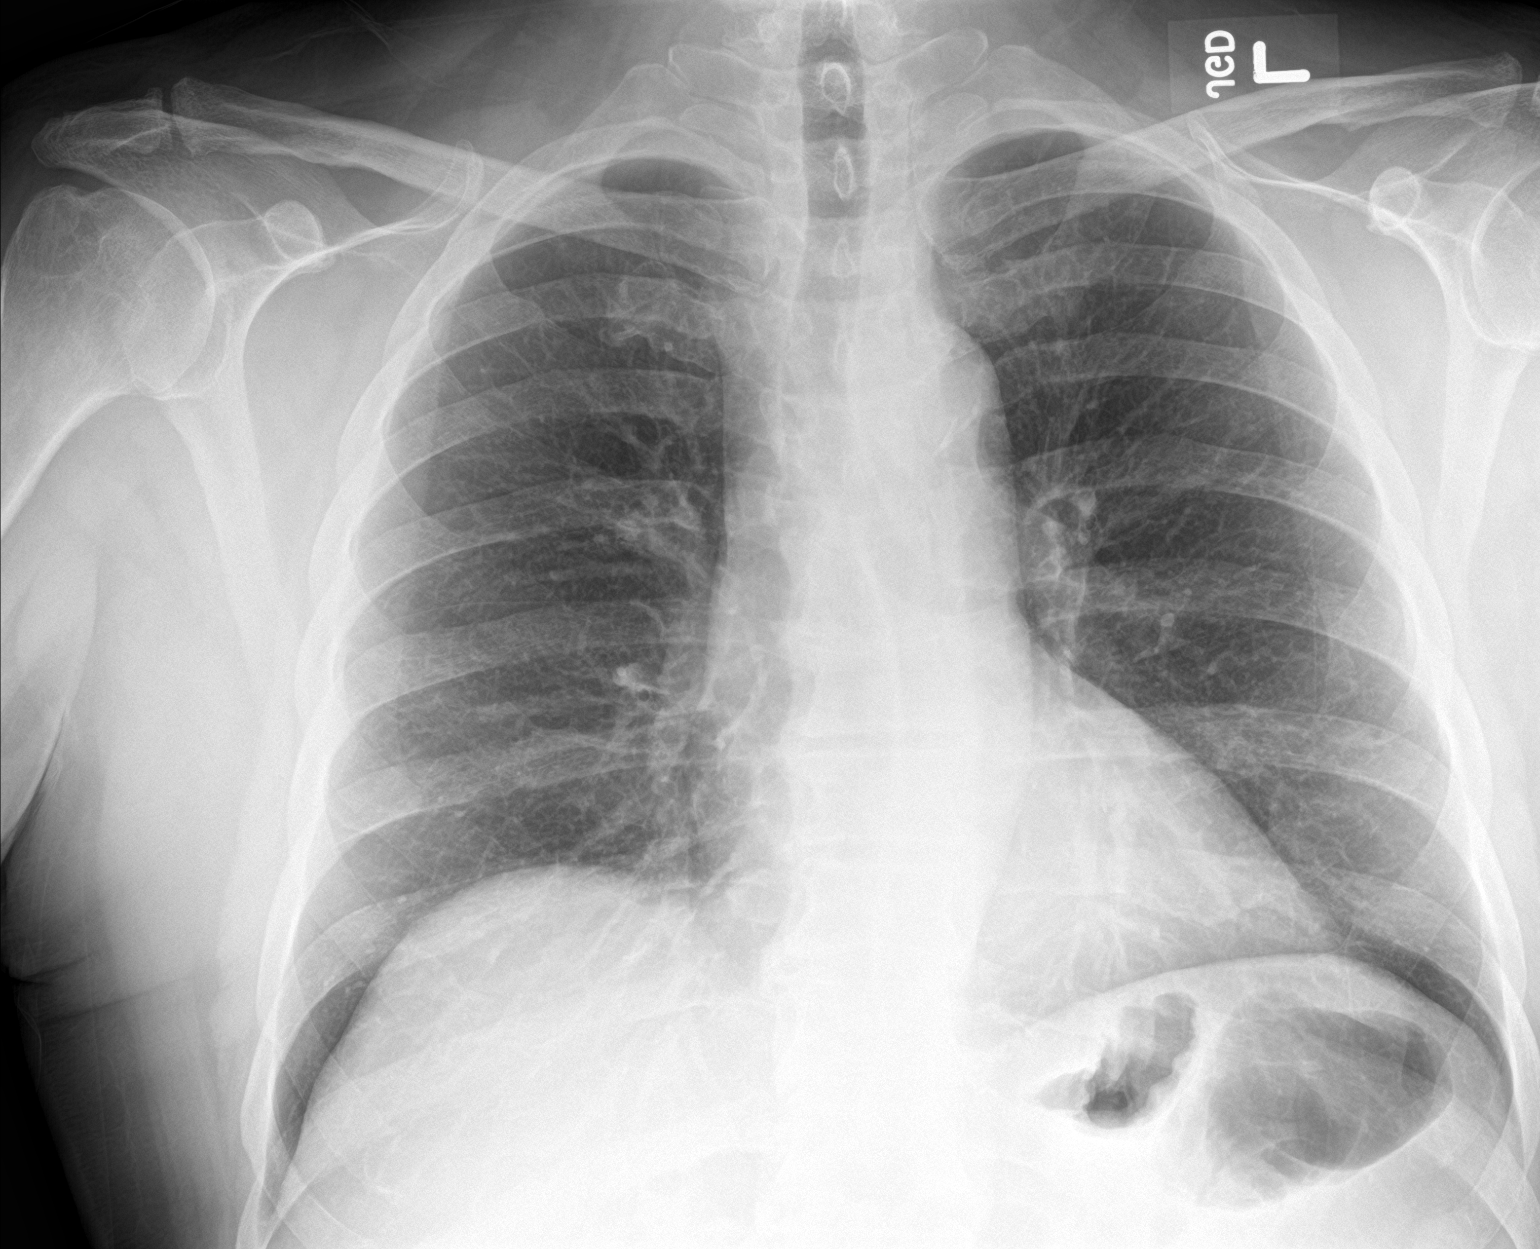

[chest lat]
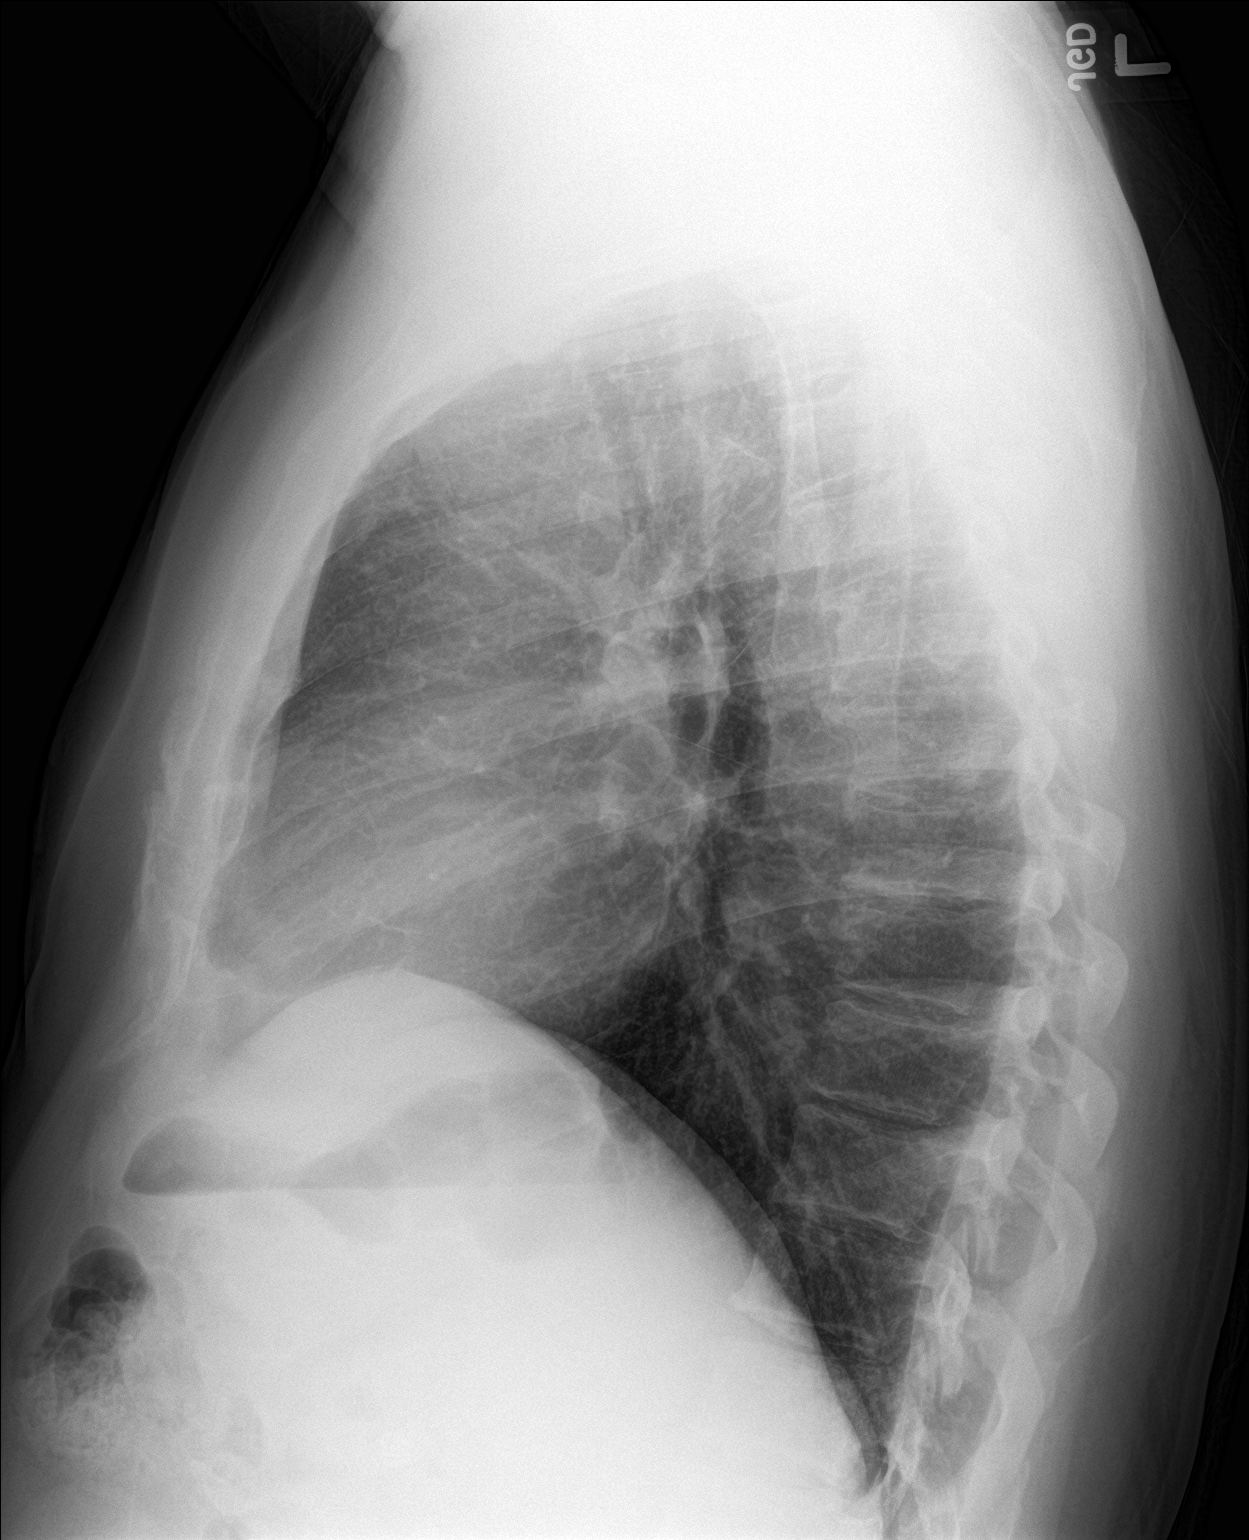

[2 of 2 positions shown; findings below may reference images not displayed]

FINDINGS: The cardiomediastinal silhouette is unremarkable.

There is no evidence of focal airspace disease, pulmonary edema,
suspicious pulmonary nodule/mass, pleural effusion, or pneumothorax.

No acute bony abnormalities are identified.
IMPRESSION: No active cardiopulmonary disease.

## 2021-07-26 DIAGNOSIS — H02885 Meibomian gland dysfunction left lower eyelid: Secondary | ICD-10-CM | POA: Diagnosis not present

## 2021-07-26 DIAGNOSIS — H25043 Posterior subcapsular polar age-related cataract, bilateral: Secondary | ICD-10-CM | POA: Diagnosis not present

## 2021-07-26 DIAGNOSIS — H02882 Meibomian gland dysfunction right lower eyelid: Secondary | ICD-10-CM | POA: Diagnosis not present

## 2021-07-26 DIAGNOSIS — H2513 Age-related nuclear cataract, bilateral: Secondary | ICD-10-CM | POA: Diagnosis not present

## 2021-07-28 ENCOUNTER — Encounter: Payer: Self-pay | Admitting: Family Medicine

## 2021-07-30 ENCOUNTER — Encounter: Payer: Self-pay | Admitting: Podiatry

## 2021-07-30 ENCOUNTER — Other Ambulatory Visit: Payer: Self-pay

## 2021-07-30 ENCOUNTER — Ambulatory Visit: Payer: Medicare PPO | Admitting: Podiatry

## 2021-07-30 DIAGNOSIS — L84 Corns and callosities: Secondary | ICD-10-CM

## 2021-07-30 DIAGNOSIS — M7752 Other enthesopathy of left foot: Secondary | ICD-10-CM

## 2021-07-30 MED ORDER — TRIAMCINOLONE ACETONIDE 10 MG/ML IJ SUSP
10.0000 mg | Freq: Once | INTRAMUSCULAR | Status: AC
  Administered 2021-07-30: 10 mg

## 2021-07-31 NOTE — Progress Notes (Signed)
Subjective:   Patient ID: Daniel Blackwell, male   DOB: 71 y.o.   MRN: [id number]   HPI Patient presents with a lot of pain in the left fifth metatarsal and fluid buildup stating he did very well with this for several years   ROS      Objective:  Physical Exam  Neurovascular status intact prominent fifth metatarsal left with inflammation fluid of the fifth MPJ plantar capsule with keratotic lesions of the fifth metatarsal head     Assessment:  Inflammatory capsulitis of the fifth MPJ left plantar fluid buildup along with lesion formation secondary to bone structure     Plan:  H&P reviewed condition at great length discussed bone structure and the possibility at 1 point in future with fifth metatarsal head resection educating him on procedure.  Today I went ahead I did sterile prep I injected the plantar fifth MPJ 3 mg dexamethasone Kenalog and went ahead and debrided lesion fully and advised on supportive care.  Reappoint to recheck as needed and ultimate surgery may be necessary

## 2021-09-04 ENCOUNTER — Other Ambulatory Visit: Payer: Self-pay | Admitting: Family Medicine

## 2021-10-04 ENCOUNTER — Ambulatory Visit: Payer: Medicare PPO | Admitting: Family Medicine

## 2021-10-04 VITALS — BP 120/82 | HR 63 | Temp 97.8°F | Ht 68.0 in | Wt 218.4 lb

## 2021-10-04 DIAGNOSIS — T162XXA Foreign body in left ear, initial encounter: Secondary | ICD-10-CM

## 2021-10-04 NOTE — Progress Notes (Signed)
? ?Subjective:  ? ? Patient ID: Daniel Blackwell, male    DOB: [birth year], 71 y.o.   MRN: [id number] ? ?HPI ?Patient presents today with a Q-tip stuck in his left ear.  He states that he went to clean his ears out this morning with a Q-tip and the entire hand came off in his left auditory canal.  Is not visible from the outside however with the speculum, I am able to see the entire Q-tip lodged adjacent to his tympanic membrane.  Carefully I was able to remove this with a pair of alligator forceps. ?Past Medical History:  ?Diagnosis Date  ? Allergic reaction to alpha-gal 12/2019  ? can't eat beef  ? Allergy   ? mold/yeast, bee sting  ? Arthritis 2019  ? GOUT  ? Bipolar 1 disorder ([place]) 1996  ? pt states 25 years ago, pt states not a problem  ? Colon polyps   ? Hyperglycemia   ? Hyperlipidemia   ? Hypertension   ? ?Past Surgical History:  ?Procedure Laterality Date  ? COLON SURGERY  08/02/2019  ? Ileocectomy after colonoscopy perforated his bowel.  ? COLONOSCOPY  07/2019  ? 20 mm polyp , clip placed, bowel perforated at home, pt went to ER and had Ileocectomy  ? EYE SURGERY    ? laser eye surgery mar 2018, and april 2018, torn retina  ? LAPAROTOMY N/A 08/02/2019  ? Procedure: EXPLORATORY LAPAROTOMY, ILEOCECTOMY;  Surgeon: Ralene Ok, MD;  Location: [place];  Service: General;  Laterality: N/A;  ? ?Current Outpatient Medications on File Prior to Visit  ?Medication Sig Dispense Refill  ? acetaminophen (TYLENOL) 325 MG tablet Take 2 tablets (650 mg total) by mouth every 6 (six) hours as needed for mild pain.    ? allopurinol (ZYLOPRIM) 300 MG tablet TAKE 1 TABLET BY MOUTH EVERY DAY 90 tablet 1  ? amLODipine (NORVASC) 5 MG tablet TAKE 1 TABLET(5 MG) BY MOUTH DAILY 30 tablet 5  ? aspirin 81 MG tablet Take 81 mg by mouth daily.    ? benzonatate (TESSALON PERLES) 100 MG capsule Take 2 capsules (200 mg total) by mouth 3 (three) times daily as needed for cough. 20 capsule 0  ? cetirizine (ZYRTEC) 10 MG chewable tablet Chew  10 mg by mouth daily.    ? colchicine 0.6 MG tablet TAKE 1 TABLET(0.6 MG) BY MOUTH DAILY 30 tablet 2  ? docusate sodium (COLACE) 100 MG capsule Take 1 capsule (100 mg total) by mouth 2 (two) times daily as needed for mild constipation. 10 capsule 0  ? EPINEPHrine 0.3 mg/0.3 mL IJ SOAJ injection Inject 0.3 mLs (0.3 mg total) into the muscle as needed for anaphylaxis. 1 each 1  ? famotidine (PEPCID) 40 MG tablet TAKE 1 TABLET(40 MG) BY MOUTH DAILY 90 tablet 1  ? fluticasone (FLONASE) 50 MCG/ACT nasal spray SHAKE LIQUID AND USE 1 SPRAY IN EACH NOSTRIL IN THE MORNING AND AT BEDTIME 48 g 0  ? indomethacin (INDOCIN) 50 MG capsule Take 1 capsule (50 mg total) by mouth 3 (three) times daily with meals. 21 capsule 0  ? levocetirizine (XYZAL) 5 MG tablet TAKE 1 TABLET(5 MG) BY MOUTH EVERY EVENING 30 tablet 0  ? lisinopril (ZESTRIL) 20 MG tablet TAKE 1 TABLET(20 MG) BY MOUTH DAILY 90 tablet 2  ? methocarbamol (ROBAXIN) 500 MG tablet Take 1 tablet (500 mg total) by mouth every 8 (eight) hours as needed for muscle spasms. 30 tablet 0  ? Multiple Vitamin (MULTIVITAMIN) tablet Take  1 tablet by mouth daily.    ? Omega-3 Fatty Acids (FISH OIL) 1000 MG CPDR Take 2 capsules by mouth See admin instructions. 2-3 times weekly    ? ondansetron (ZOFRAN) 4 MG tablet Take 1 tablet (4 mg total) by mouth every 6 (six) hours as needed for nausea. 20 tablet 0  ? oxyCODONE (OXY IR/ROXICODONE) 5 MG immediate release tablet Take 1 tablet (5 mg total) by mouth every 6 (six) hours as needed for breakthrough pain. 15 tablet 0  ? promethazine-dextromethorphan (PROMETHAZINE-DM) 6.25-15 MG/5ML syrup Take 5 mLs by mouth 4 (four) times daily as needed for cough. 118 mL 0  ? rosuvastatin (CRESTOR) 10 MG tablet Take 1 tablet (10 mg total) by mouth daily. 90 tablet 3  ? vitamin B-12 (CYANOCOBALAMIN) 1000 MCG tablet Take 1,000 mcg by mouth daily.     ? ?Current Facility-Administered Medications on File Prior to Visit  ?Medication Dose Route Frequency Provider  Last Rate Last Admin  ? 0.9 %  sodium chloride infusion  500 mL Intravenous Once Pyrtle, Lajuan Lines, MD      ? ?Allergies  ?Allergen Reactions  ? Asa [Aspirin] Swelling  ? Other Swelling  ?  CASHEWS  ? Penicillins Rash  ?  Did it involve swelling of the face/tongue/throat, SOB, or low BP? No ?Did it involve sudden or severe rash/hives, skin peeling, or any reaction on the inside of your mouth or nose? No ?Did you need to seek medical attention at a hospital or doctor's office? No ?When did it last happen?    50 years ago   ?If all above answers are ?NO?, may proceed with cephalosporin use. ?  ? Sulfa Antibiotics Rash  ? ?Social History  ? ?Socioeconomic History  ? Marital status: Married  ?  Spouse name: Not on file  ? Number of children: Not on file  ? Years of education: Not on file  ? Highest education level: Not on file  ?Occupational History  ? Occupation: [employer]  ?  Comment: School System  ?Tobacco Use  ? Smoking status: Former  ?  Types: Pipe  ?  Quit date: 1970  ?  Years since quitting: 53.3  ?  Passive exposure: Never  ? Smokeless tobacco: Never  ?Vaping Use  ? Vaping Use: Never used  ?Substance and Sexual Activity  ? Alcohol use: Yes  ?  Alcohol/week: 1.0 standard drink  ?  Types: 1 drink(s) per week  ? Drug use: Never  ? Sexual activity: Yes  ?Other Topics Concern  ? Not on file  ?Social History Narrative  ? Works with the school system.   ? 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  ? Percentiles of graduating students etc.  ? At certain times of the year he works long hours  ? but then at certain times of the year works a few hours.  ? ?Social Determinants of Health  ? ?Financial Resource Strain: Not on file  ?Food Insecurity: Not on file  ?Transportation Needs: Not on file  ?Physical Activity: Not on file  ?Stress: Not on file  ?Social Connections: Not on file  ?Intimate Partner Violence: Not on file  ? ? ? ?Review of Systems  ?All other systems reviewed and are negative. ? ?   ?Objective:   ? Physical Exam ?Vitals reviewed.  ?Constitutional:   ?   Appearance: Normal appearance.  ?HENT:  ?   Right Ear: Tympanic membrane and ear canal normal.  ?  Left Ear: A foreign body is present.  ?Cardiovascular:  ?   Rate and Rhythm: Normal rate and regular rhythm.  ?Pulmonary:  ?   Effort: Pulmonary effort is normal.  ?   Breath sounds: Normal breath sounds.  ?Neurological:  ?   Mental Status: He is alert.  ? ? ? ? ? ?   ?Assessment & Plan:  ?Foreign body of left ear, initial encounter ?I was able to remove the foreign body from the left auditory canal using a pair of alligator forceps.  There was no visible trauma or damage to the tympanic membrane.  Patient tolerated the procedure without any complication.  Follow-up as needed ?

## 2021-10-10 ENCOUNTER — Encounter: Payer: Self-pay | Admitting: Family Medicine

## 2021-10-11 ENCOUNTER — Other Ambulatory Visit: Payer: Self-pay | Admitting: Family Medicine

## 2021-10-11 DIAGNOSIS — I10 Essential (primary) hypertension: Secondary | ICD-10-CM

## 2021-10-11 MED ORDER — AMLODIPINE BESYLATE 5 MG PO TABS: ORAL_TABLET | ORAL | 3 refills | Status: DC

## 2021-11-23 DIAGNOSIS — L821 Other seborrheic keratosis: Secondary | ICD-10-CM | POA: Diagnosis not present

## 2021-11-23 DIAGNOSIS — D225 Melanocytic nevi of trunk: Secondary | ICD-10-CM | POA: Diagnosis not present

## 2021-12-13 ENCOUNTER — Other Ambulatory Visit: Payer: Self-pay | Admitting: Family Medicine

## 2021-12-13 DIAGNOSIS — I10 Essential (primary) hypertension: Secondary | ICD-10-CM

## 2021-12-15 NOTE — Telephone Encounter (Signed)
Refused amlodipine 5 mg this was sent in 10/11/2021 #90, 3 refills to [facility] on 10/11/2021 at 1:21 PM.

## 2022-01-14 ENCOUNTER — Other Ambulatory Visit: Payer: Self-pay | Admitting: Family Medicine

## 2022-01-14 NOTE — Telephone Encounter (Signed)
Requested Prescriptions  Pending Prescriptions Disp Refills  . allopurinol (ZYLOPRIM) 300 MG tablet [Pharmacy Med Name: allopurinol 300 mg tablet] 90 tablet 1    Sig: TAKE 1 TABLET BY MOUTH EVERY DAY     Endocrinology:  Gout Agents - allopurinol Passed - 01/14/2022  9:51 AM      Passed - Uric Acid in normal range and within 360 days    Uric Acid, Serum  Date Value Ref Range Status  06/21/2021 5.6 4.0 - 8.0 mg/dL Final    Comment:    Therapeutic target for gout patients: <6.0 mg/dL .          Passed - Cr in normal range and within 360 days    Creat  Date Value Ref Range Status  06/21/2021 0.93 0.70 - 1.28 mg/dL Final         Passed - Valid encounter within last 12 months    Recent Outpatient Visits          3 months ago Foreign body of left ear, initial encounter   [place] Pickard, Cammie Mcgee, MD   6 months ago General medical exam   [place] Susy Frizzle, MD   1 year ago Essential hypertension   [employer] Susy Frizzle, MD   2 years ago [place] Susy Frizzle, MD   3 years ago Essential hypertension   [place] Pickard, Cammie Mcgee, MD             Passed - CBC within normal limits and completed in the last 12 months    WBC  Date Value Ref Range Status  06/21/2021 9.6 3.8 - 10.8 Thousand/uL Final   RBC  Date Value Ref Range Status  06/21/2021 5.25 4.20 - 5.80 Million/uL Final   Hemoglobin  Date Value Ref Range Status  06/21/2021 16.3 13.2 - 17.1 g/dL Final  04/27/2020 15.9 13.0 - 17.7 g/dL Final   HCT  Date Value Ref Range Status  06/21/2021 48.6 38.5 - 50.0 % Final   Hematocrit  Date Value Ref Range Status  04/27/2020 48.6 37.5 - 51.0 % Final   MCHC  Date Value Ref Range Status  06/21/2021 33.5 32.0 - 36.0 g/dL Final   [facility]  Date Value Ref Range Status  06/21/2021 31.0 27.0 - 33.0 pg Final   MCV  Date Value Ref Range Status   06/21/2021 92.6 80.0 - 100.0 fL Final  04/27/2020 91 79 - 97 fL Final   No results found for: "PLTCOUNTKUC", "LABPLAT", "POCPLA" RDW  Date Value Ref Range Status  06/21/2021 13.1 11.0 - 15.0 % Final  04/27/2020 13.3 11.6 - 15.4 % Final

## 2022-02-12 ENCOUNTER — Other Ambulatory Visit: Payer: Self-pay | Admitting: Family Medicine

## 2022-02-12 DIAGNOSIS — I10 Essential (primary) hypertension: Secondary | ICD-10-CM

## 2022-03-08 ENCOUNTER — Ambulatory Visit: Payer: Medicare PPO | Admitting: Family Medicine

## 2022-03-08 VITALS — BP 138/82 | HR 83 | Ht 68.0 in | Wt 218.0 lb

## 2022-03-08 DIAGNOSIS — M5412 Radiculopathy, cervical region: Secondary | ICD-10-CM | POA: Diagnosis not present

## 2022-03-08 MED ORDER — PREDNISONE 20 MG PO TABS: ORAL_TABLET | ORAL | 0 refills | Status: DC

## 2022-03-08 NOTE — Progress Notes (Signed)
Subjective:    Patient ID: Daniel Blackwell, male    DOB: [birth year], 71 y.o.   MRN: [id number]  Shoulder Pain   Patient states that he has been having pain originating in the left side of his neck and radiating into his superior posterior left shoulder.  It will also occasionally radiate down his left arm into his left elbow.  The pain is dull and intense.  It hurts for him to sleep at night.  It hurts for him to lay on his shoulder.  If he lays flat on his back he can rest comfortably but if he turns on his side he will start to have the pain radiate into his arm.  He denies any chest pain.  Occasionally he will have neuropathic pain radiate across the anterior aspect of his shoulder up near his collarbone.  There is no substernal chest pain or exertional chest pain or angina.  He denies any cough or dyspnea.  He denies any weakness in his hands.  He is able to fully abduct his shoulder without difficulty.  He has a negative empty can sign today.  He has negative Hawkins sign today.  However Spurling sign is also negative Past Medical History:  Diagnosis Date   Allergic reaction to alpha-gal 12/2019   can't eat beef   Allergy    mold/yeast, bee sting   Arthritis 2019   GOUT   Bipolar 1 disorder ([place]) 1996   pt states 25 years ago, pt states not a problem   Colon polyps    Hyperglycemia    Hyperlipidemia    Hypertension    Past Surgical History:  Procedure Laterality Date   COLON SURGERY  08/02/2019   Ileocectomy after colonoscopy perforated his bowel.   COLONOSCOPY  07/2019   20 mm polyp , clip placed, bowel perforated at home, pt went to ER and had Ileocectomy   EYE SURGERY     laser eye surgery mar 2018, and april 2018, torn retina   LAPAROTOMY N/A 08/02/2019   Procedure: EXPLORATORY LAPAROTOMY, ILEOCECTOMY;  Surgeon: Ralene Ok, MD;  Location: [place];  Service: General;  Laterality: N/A;   Current Outpatient Medications on File Prior to Visit  Medication Sig Dispense  Refill   acetaminophen (TYLENOL) 325 MG tablet Take 2 tablets (650 mg total) by mouth every 6 (six) hours as needed for mild pain.     allopurinol (ZYLOPRIM) 300 MG tablet TAKE 1 TABLET BY MOUTH EVERY DAY 90 tablet 1   amLODipine (NORVASC) 5 MG tablet TAKE 1 TABLET(5 MG) BY MOUTH DAILY 90 tablet 3   aspirin 81 MG tablet Take 81 mg by mouth daily.     cetirizine (ZYRTEC) 10 MG chewable tablet Chew 10 mg by mouth daily.     colchicine 0.6 MG tablet TAKE 1 TABLET(0.6 MG) BY MOUTH DAILY 30 tablet 2   docusate sodium (COLACE) 100 MG capsule Take 1 capsule (100 mg total) by mouth 2 (two) times daily as needed for mild constipation. 10 capsule 0   EPINEPHrine 0.3 mg/0.3 mL IJ SOAJ injection Inject 0.3 mLs (0.3 mg total) into the muscle as needed for anaphylaxis. 1 each 1   fluticasone (FLONASE) 50 MCG/ACT nasal spray SHAKE LIQUID AND USE 1 SPRAY IN EACH NOSTRIL IN THE MORNING AND AT BEDTIME 48 g 0   indomethacin (INDOCIN) 50 MG capsule Take 1 capsule (50 mg total) by mouth 3 (three) times daily with meals. 21 capsule 0   levocetirizine (XYZAL) 5 MG  tablet TAKE 1 TABLET(5 MG) BY MOUTH EVERY EVENING 30 tablet 0   lisinopril (ZESTRIL) 20 MG tablet TAKE 1 TABLET BY MOUTH EVERY DAY 90 tablet 0   Multiple Vitamin (MULTIVITAMIN) tablet Take 1 tablet by mouth daily.     Omega-3 Fatty Acids (FISH OIL) 1000 MG CPDR Take 2 capsules by mouth See admin instructions. 2-3 times weekly     rosuvastatin (CRESTOR) 10 MG tablet Take 1 tablet (10 mg total) by mouth daily. 90 tablet 3   vitamin B-12 (CYANOCOBALAMIN) 1000 MCG tablet Take 1,000 mcg by mouth daily.      Current Facility-Administered Medications on File Prior to Visit  Medication Dose Route Frequency Provider Last Rate Last Admin   0.9 %  sodium chloride infusion  500 mL Intravenous Once Pyrtle, Lajuan Lines, MD       Allergies  Allergen Reactions   Asa [Aspirin] Swelling   Other Swelling    CASHEWS   Penicillins Rash    Did it involve swelling of the  face/tongue/throat, SOB, or low BP? No Did it involve sudden or severe rash/hives, skin peeling, or any reaction on the inside of your mouth or nose? No Did you need to seek medical attention at a hospital or doctor's office? No When did it last happen?    50 years ago   If all above answers are "NO", may proceed with cephalosporin use.    Sulfa Antibiotics Rash   Social History   Socioeconomic History   Marital status: Married    Spouse name: Not on file   Number of children: Not on file   Years of education: Not on file   Highest education level: Not on file  Occupational History   Occupation: State, Conservation officer, nature    Comment: School System  Tobacco Use   Smoking status: Former    Types: Pipe    Quit date: 1970    Years since quitting: 53.7    Passive exposure: Never   Smokeless tobacco: Never  Vaping Use   Vaping Use: Never used  Substance and Sexual Activity   Alcohol use: Yes    Alcohol/week: 1.0 standard drink of alcohol    Types: 1 drink(s) per week   Drug use: Never   Sexual activity: Yes  Other Topics Concern   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   At certain times of the year he works long hours   but then at certain times of the year works a few hours.   Social Determinants of Health   Financial Resource Strain: Not on file  Food Insecurity: Not on file  Transportation Needs: Not on file  Physical Activity: Not on file  Stress: Not on file  Social Connections: Not on file  Intimate Partner Violence: Not on file     Review of Systems  All other systems reviewed and are negative.      Objective:   Physical Exam Vitals reviewed.  Constitutional:      Appearance: Normal appearance.  HENT:     Right Ear: Tympanic membrane and ear canal normal.     Left Ear: A foreign body is present.  Cardiovascular:     Rate and Rhythm:  Normal rate and regular rhythm.  Pulmonary:     Effort: Pulmonary effort is normal.     Breath sounds: Normal  breath sounds.  Musculoskeletal:     Left shoulder: Crepitus present. No swelling, deformity, effusion, tenderness or bony tenderness. Normal range of motion. Normal strength.     Cervical back: Crepitus present. Muscular tenderness present. No pain with movement or spinous process tenderness. Normal range of motion.  Neurological:     Mental Status: He is alert.           Assessment & Plan:  Cervical radiculopathy - Plan: DG Cervical Spine Complete History is not conclusive however I feel that the pain is more likely cervical radiculopathy likely due to a disc in his neck impinging upon the nerve rather than an actual shoulder issue.  I recommended a prednisone taper pack and an x-ray of his neck.  Reevaluate if symptoms do not improve or if they change at any time.

## 2022-03-09 ENCOUNTER — Ambulatory Visit
Admission: RE | Admit: 2022-03-09 | Discharge: 2022-03-09 | Disposition: A | Discharge status: Home / Self Care | Payer: Medicare PPO | Source: Ambulatory Visit | Attending: Family Medicine | Admitting: Family Medicine

## 2022-03-09 DIAGNOSIS — M5412 Radiculopathy, cervical region: Secondary | ICD-10-CM

## 2022-03-09 DIAGNOSIS — M542 Cervicalgia: Secondary | ICD-10-CM | POA: Diagnosis not present

## 2022-04-06 ENCOUNTER — Other Ambulatory Visit: Payer: Self-pay | Admitting: Family Medicine

## 2022-04-06 DIAGNOSIS — I10 Essential (primary) hypertension: Secondary | ICD-10-CM

## 2022-04-14 ENCOUNTER — Encounter: Payer: Self-pay | Admitting: Family Medicine

## 2022-04-14 DIAGNOSIS — M25519 Pain in unspecified shoulder: Secondary | ICD-10-CM

## 2022-04-15 NOTE — Telephone Encounter (Signed)
Spoke w/pt regarding results, Proveder's notes and recommendations. Pt voiced understanding and agrees w/plan. Referral placed for Ortho

## 2022-04-18 ENCOUNTER — Encounter: Payer: Self-pay | Admitting: Orthopaedic Surgery

## 2022-04-18 ENCOUNTER — Ambulatory Visit (INDEPENDENT_AMBULATORY_CARE_PROVIDER_SITE_OTHER): Payer: Medicare PPO | Admitting: Orthopaedic Surgery

## 2022-04-18 VITALS — BP 130/79 | HR 74 | Ht 68.0 in | Wt 218.0 lb

## 2022-04-18 DIAGNOSIS — M7542 Impingement syndrome of left shoulder: Secondary | ICD-10-CM

## 2022-04-18 NOTE — Progress Notes (Unsigned)
Office Visit Note   Patient: Daniel Blackwell           Date of Birth: [birth year]           MRN: [id number] Visit Date: 04/18/2022              Requested by: Susy Frizzle, MD [address] [place],  [place] [address] PCP: Susy Frizzle, MD   Assessment & Plan: Visit Diagnoses: No diagnosis found.  Plan: ***  Follow-Up Instructions: No follow-ups on file.   Orders:  No orders of the defined types were placed in this encounter.  No orders of the defined types were placed in this encounter.     Procedures: Large Joint Inj: L subacromial bursa on 04/18/2022 3:51 PM Indications: pain Details: 22 G 1.5 in needle  Arthrogram: No  Medications: 4 mL bupivacaine 0.25 %; 40 mg methylPREDNISolone acetate 40 MG/ML; 0.5 mL lidocaine 1 % Outcome: tolerated well, no immediate complications Procedure, treatment alternatives, risks and benefits explained, specific risks discussed. Consent was given by the patient. Immediately prior to procedure a time out was called to verify the correct patient, procedure, equipment, support staff and site/side marked as required. Patient was prepped and draped in the usual sterile fashion.       Clinical Data: No additional findings.   Subjective: Chief Complaint  Patient presents with   Neck - Pain   Left Shoulder - Pain    HPI  Review of Systems   Objective: Vital Signs: BP 130/79   Pulse 74   Ht '5\' 8"'$  (1.727 m)   Wt 218 lb (98.9 kg)   BMI 33.15 kg/m   Physical Exam  Ortho Exam  Specialty Comments:  No specialty comments available.  Imaging: No results found.   PMFS History: Patient Active Problem List   Diagnosis Date Noted   Urticaria 04/27/2020   Chronic rhinitis 04/27/2020   Angioedema of lips 04/27/2020   Multiple drug allergies 04/27/2020   Adverse food reaction 04/27/2020   Allergic reaction to alpha-gal    Hyperlipidemia    Hyperglycemia    Multiple allergies 12/01/2010   Hypertension  12/01/2010   Colon polyps 12/01/2010   Past Medical History:  Diagnosis Date   Allergic reaction to alpha-gal 12/2019   can't eat beef   Allergy    mold/yeast, bee sting   Arthritis 2019   GOUT   Bipolar 1 disorder ([place]) 1996   pt states 25 years ago, pt states not a problem   Colon polyps    Hyperglycemia    Hyperlipidemia    Hypertension     Family History  Problem Relation Age of Onset   Colon cancer Neg Hx    Colon polyps Neg Hx    Esophageal cancer Neg Hx    Rectal cancer Neg Hx    Stomach cancer Neg Hx     Past Surgical History:  Procedure Laterality Date   COLON SURGERY  08/02/2019   Ileocectomy after colonoscopy perforated his bowel.   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

## 2022-04-19 DIAGNOSIS — M7542 Impingement syndrome of left shoulder: Secondary | ICD-10-CM | POA: Insufficient documentation

## 2022-04-19 MED ORDER — METHYLPREDNISOLONE ACETATE 40 MG/ML IJ SUSP
40.0000 mg | INTRAMUSCULAR | Status: AC | PRN
  Administered 2022-04-18: 40 mg via INTRA_ARTICULAR

## 2022-04-19 MED ORDER — LIDOCAINE HCL 1 % IJ SOLN
0.5000 mL | INTRAMUSCULAR | Status: AC | PRN
  Administered 2022-04-18: .5 mL

## 2022-04-19 MED ORDER — BUPIVACAINE HCL 0.25 % IJ SOLN
4.0000 mL | INTRAMUSCULAR | Status: AC | PRN
  Administered 2022-04-18: 4 mL via INTRA_ARTICULAR

## 2022-04-21 ENCOUNTER — Other Ambulatory Visit: Payer: Self-pay | Admitting: Family Medicine

## 2022-04-21 NOTE — Telephone Encounter (Signed)
Too soon last RF 06/25/21 #90 3 RF  Requested Prescriptions  Refused Prescriptions Disp Refills   rosuvastatin (CRESTOR) 10 MG tablet [Pharmacy Med Name: rosuvastatin 10 mg tablet] 90 tablet 2    Sig: TAKE 1 TABLET BY MOUTH EVERY DAY     Cardiovascular:  Antilipid - Statins 2 Failed - 04/21/2022 11:38 AM      Failed - Lipid Panel in normal range within the last 12 months    Cholesterol  Date Value Ref Range Status  06/21/2021 209 (H) <200 mg/dL Final   LDL Cholesterol (Calc)  Date Value Ref Range Status  06/21/2021 117 (H) mg/dL (calc) Final    Comment:    Reference range: <100 . Desirable range <100 mg/dL for primary prevention;   <70 mg/dL for patients with CHD or diabetic patients  with > or = 2 CHD risk factors. Marland Kitchen LDL-C is now calculated using the Martin-Hopkins  calculation, which is a validated novel method providing  better accuracy than the Friedewald equation in the  estimation of LDL-C.  Cresenciano Genre et al. Annamaria Helling. 1610;960(45): 2061-2068  (http://education.[facility].com/faq/FAQ164)    HDL  Date Value Ref Range Status  06/21/2021 51 > OR = 40 mg/dL Final   Triglycerides  Date Value Ref Range Status  06/21/2021 287 (H) <150 mg/dL Final    Comment:    . If a non-fasting specimen was collected, consider repeat triglyceride testing on a fasting specimen if clinically indicated.  Yates Decamp et al. J. of Clin. Lipidol. 4098;1:191-478. Marland Kitchen          Passed - Cr in normal range and within 360 days    Creat  Date Value Ref Range Status  06/21/2021 0.93 0.70 - 1.28 mg/dL Final         Passed - Patient is not pregnant      Passed - Valid encounter within last 12 months    Recent Outpatient Visits           6 months ago Foreign body of left ear, initial encounter   [place] Pickard, Cammie Mcgee, MD   10 months ago General medical exam   [place] Susy Frizzle, MD   1 year ago Essential hypertension   [place] Denn[place] Schaumann, Cammie Mcgee, MD   2 years ago [place] Dennard Schaumann Cammie Mcgee, MD   3 years ago Essential hypertension   [place], Cammie Mcgee, MD

## 2022-05-18 ENCOUNTER — Telehealth: Payer: Self-pay | Admitting: Family Medicine

## 2022-05-18 NOTE — Telephone Encounter (Signed)
Left message for patient to call back and schedule Medicare Annual Wellness Visit (AWV) in office.   If not able to come in office, please offer to do virtually or by telephone.   Last AWV: 06/25/2021   Please schedule at anytime with [facility] [place]  If any questions, please contact me at [phone].  Thank you ,  [place]

## 2022-05-19 NOTE — Telephone Encounter (Signed)
Patient returned call; stated he will call back with his availability when he gets his work schedule.

## 2022-05-26 ENCOUNTER — Other Ambulatory Visit: Payer: Self-pay | Admitting: Family Medicine

## 2022-05-26 DIAGNOSIS — I10 Essential (primary) hypertension: Secondary | ICD-10-CM

## 2022-05-26 NOTE — Telephone Encounter (Signed)
Requested Prescriptions  Pending Prescriptions Disp Refills   lisinopril (ZESTRIL) 20 MG tablet [Pharmacy Med Name: lisinopril 20 mg tablet] 90 tablet 0    Sig: TAKE 1 TABLET BY MOUTH EVERY DAY     Cardiovascular:  ACE Inhibitors Failed - 05/26/2022  8:58 AM      Failed - Cr in normal range and within 180 days    Creat  Date Value Ref Range Status  06/21/2021 0.93 0.70 - 1.28 mg/dL Final         Failed - K in normal range and within 180 days    Potassium  Date Value Ref Range Status  06/21/2021 4.3 3.5 - 5.3 mmol/L Final         Failed - Valid encounter within last 6 months    Recent Outpatient Visits           7 months ago Foreign body of left ear, initial encounter   [place] Pickard, Cammie Mcgee, MD   11 months ago General medical exam   [place] Susy Frizzle, MD   1 year ago Essential hypertension   [place] Dennard Schaumann, Cammie Mcgee, MD   2 years ago [place] Dennard Schaumann, Cammie Mcgee, MD   3 years ago Essential hypertension   [place], Cammie Mcgee, MD              Passed - Patient is not pregnant      Passed - Last BP in normal range    BP Readings from Last 1 Encounters:  04/18/22 130/79

## 2022-06-20 ENCOUNTER — Telehealth: Payer: Self-pay | Admitting: Family Medicine

## 2022-06-20 NOTE — Telephone Encounter (Signed)
Left message for patient to call back and schedule Medicare Annual Wellness Visit (AWV) in office.   If not able to come in office, please offer to do virtually or by telephone.   Last AWV:06/25/2021   Please schedule at any time with [facility].  30 minute appointment  Any questions, please contact me at [phone]   Thank you,   [facility]  Ambulatory Clinical Support for Daniel Blackwell. [facility] ??[phone] or ??[phone]

## 2022-07-04 ENCOUNTER — Other Ambulatory Visit: Payer: Self-pay | Admitting: Family Medicine

## 2022-07-14 ENCOUNTER — Other Ambulatory Visit: Payer: Self-pay | Admitting: Family Medicine

## 2022-07-14 DIAGNOSIS — I10 Essential (primary) hypertension: Secondary | ICD-10-CM

## 2022-07-14 NOTE — Telephone Encounter (Signed)
Requested Prescriptions  Pending Prescriptions Disp Refills   lisinopril (ZESTRIL) 20 MG tablet [Pharmacy Med Name: lisinopril 20 mg tablet] 90 tablet 0    Sig: TAKE 1 TABLET BY MOUTH EVERY DAY     Cardiovascular:  ACE Inhibitors Failed - 07/14/2022  2:35 PM      Failed - Cr in normal range and within 180 days    Creat  Date Value Ref Range Status  06/21/2021 0.93 0.70 - 1.28 mg/dL Final         Failed - K in normal range and within 180 days    Potassium  Date Value Ref Range Status  06/21/2021 4.3 3.5 - 5.3 mmol/L Final         Failed - Valid encounter within last 6 months    Recent Outpatient Visits           9 months ago Foreign body of left ear, initial encounter   [place] Pickard, Cammie Mcgee, MD   1 year ago General medical exam   [place] Susy Frizzle, MD   2 years ago Essential hypertension   [place], Cammie Mcgee, MD   2 years ago [place] Dennard Schaumann, Cammie Mcgee, MD   3 years ago Essential hypertension   [place], Cammie Mcgee, MD              Passed - Patient is not pregnant      Passed - Last BP in normal range    BP Readings from Last 1 Encounters:  04/18/22 130/79          allopurinol (ZYLOPRIM) 300 MG tablet [Pharmacy Med Name: allopurinol 300 mg tablet] 90 tablet 1    Sig: TAKE 1 TABLET BY [place]     Endocrinology:  Gout Agents - allopurinol Failed - 07/14/2022  2:35 PM      Failed - Uric Acid in normal range and within 360 days    Uric Acid, Serum  Date Value Ref Range Status  06/21/2021 5.6 4.0 - 8.0 mg/dL Final    Comment:    Therapeutic target for gout patients: <6.0 mg/dL .          Failed - Cr in normal range and within 360 days    Creat  Date Value Ref Range Status  06/21/2021 0.93 0.70 - 1.28 mg/dL Final         Failed - CBC within normal limits and completed in the last 12 months    WBC  Date Value Ref  Range Status  06/21/2021 9.6 3.8 - 10.8 Thousand/uL Final   RBC  Date Value Ref Range Status  06/21/2021 5.25 4.20 - 5.80 Million/uL Final   Hemoglobin  Date Value Ref Range Status  06/21/2021 16.3 13.2 - 17.1 g/dL Final  04/27/2020 15.9 13.0 - 17.7 g/dL Final   HCT  Date Value Ref Range Status  06/21/2021 48.6 38.5 - 50.0 % Final   Hematocrit  Date Value Ref Range Status  04/27/2020 48.6 37.5 - 51.0 % Final   MCHC  Date Value Ref Range Status  06/21/2021 33.5 32.0 - 36.0 g/dL Final   [facility]  Date Value Ref Range Status  06/21/2021 31.0 27.0 - 33.0 pg Final   MCV  Date Value Ref Range Status  06/21/2021 92.6 80.0 - 100.0 fL Final  04/27/2020 91 79 - 97 fL Final   No results found  for: "PLTCOUNTKUC", "LABPLAT", "POCPLA" RDW  Date Value Ref Range Status  06/21/2021 13.1 11.0 - 15.0 % Final  04/27/2020 13.3 11.6 - 15.4 % Final         Passed - Valid encounter within last 12 months    Recent Outpatient Visits           9 months ago Foreign body of left ear, initial encounter   [place] Pickard, Cammie Mcgee, MD   1 year ago General medical exam   [place] Susy Frizzle, MD   2 years ago Essential hypertension   [place] Susy Frizzle, MD   2 years ago [place] Susy Frizzle, MD   3 years ago Essential hypertension   [place], Cammie Mcgee, MD               rosuvastatin (CRESTOR) 10 MG tablet [Pharmacy Med Name: rosuvastatin 10 mg tablet] 90 tablet 1    Sig: TAKE 1 TABLET BY MOUTH EVERY DAY     Cardiovascular:  Antilipid - Statins 2 Failed - 07/14/2022  2:35 PM      Failed - Cr in normal range and within 360 days    Creat  Date Value Ref Range Status  06/21/2021 0.93 0.70 - 1.28 mg/dL Final         Failed - Lipid Panel in normal range within the last 12 months    Cholesterol  Date Value Ref Range Status  06/21/2021 209 (H) <200  mg/dL Final   LDL Cholesterol (Calc)  Date Value Ref Range Status  06/21/2021 117 (H) mg/dL (calc) Final    Comment:    Reference range: <100 . Desirable range <100 mg/dL for primary prevention;   <70 mg/dL for patients with CHD or diabetic patients  with > or = 2 CHD risk factors. Marland Kitchen LDL-C is now calculated using the Martin-Hopkins  calculation, which is a validated novel method providing  better accuracy than the Friedewald equation in the  estimation of LDL-C.  Cresenciano Genre et al. Annamaria Helling. 1856;314(97): 2061-2068  (http://education.[facility].com/faq/FAQ164)    HDL  Date Value Ref Range Status  06/21/2021 51 > OR = 40 mg/dL Final   Triglycerides  Date Value Ref Range Status  06/21/2021 287 (H) <150 mg/dL Final    Comment:    . If a non-fasting specimen was collected, consider repeat triglyceride testing on a fasting specimen if clinically indicated.  Yates Decamp et al. J. of Clin. Lipidol. 0263;7:858-850. Marland Kitchen          Passed - Patient is not pregnant      Passed - Valid encounter within last 12 months    Recent Outpatient Visits           9 months ago Foreign body of left ear, initial encounter   [place] Pickard, Cammie Mcgee, MD   1 year ago General medical exam   [place] Susy Frizzle, MD   2 years ago Essential hypertension   [place], Cammie Mcgee, MD   2 years ago [place] Dennard Schaumann, Cammie Mcgee, MD   3 years ago Essential hypertension   [place] Pickard, Cammie Mcgee, MD

## 2022-07-14 NOTE — Telephone Encounter (Signed)
Requested Prescriptions  Pending Prescriptions Disp Refills   rosuvastatin (CRESTOR) 10 MG tablet [Pharmacy Med Name: rosuvastatin 10 mg tablet] 90 tablet 1    Sig: TAKE 1 TABLET BY MOUTH EVERY DAY     Cardiovascular:  Antilipid - Statins 2 Failed - 07/14/2022  2:35 PM      Failed - Cr in normal range and within 360 days    Creat  Date Value Ref Range Status  06/21/2021 0.93 0.70 - 1.28 mg/dL Final         Failed - Lipid Panel in normal range within the last 12 months    Cholesterol  Date Value Ref Range Status  06/21/2021 209 (H) <200 mg/dL Final   LDL Cholesterol (Calc)  Date Value Ref Range Status  06/21/2021 117 (H) mg/dL (calc) Final    Comment:    Reference range: <100 . Desirable range <100 mg/dL for primary prevention;   <70 mg/dL for patients with CHD or diabetic patients  with > or = 2 CHD risk factors. [place] Kitchen LDL-C is now calculated using the Martin-Hopkins  calculation, which is a validated novel method providing  better accuracy than the Friedewald equation in the  estimation of LDL-C.  Cresenciano Genre et al. Annamaria Helling. 2426;834(19): 2061-2068  (http://education.[facility].com/faq/FAQ164)    HDL  Date Value Ref Range Status  06/21/2021 51 > OR = 40 mg/dL Final   Triglycerides  Date Value Ref Range Status  06/21/2021 287 (H) <150 mg/dL Final    Comment:    . If a non-fasting specimen was collected, consider repeat triglyceride testing on a fasting specimen if clinically indicated.  Yates Decamp et al. J. of Clin. Lipidol. 6222;9:798-921. [place] Kitchen          Passed - Patient is not pregnant      Passed - Valid encounter within last 12 months    Recent Outpatient Visits           9 months ago Foreign body of left ear, initial encounter   [place] Pickard, Cammie Mcgee, MD   1 year ago General medical exam   [place] Susy Frizzle, MD   2 years ago Essential hypertension   [place] Susy Frizzle, MD   2  years ago [place] Susy Frizzle, MD   3 years ago Essential hypertension   [place] Pickard, Cammie Mcgee, MD              Signed Prescriptions Disp Refills   lisinopril (ZESTRIL) 20 MG tablet 90 tablet 0    Sig: TAKE 1 TABLET BY MOUTH EVERY DAY     Cardiovascular:  ACE Inhibitors Failed - 07/14/2022  2:35 PM      Failed - Cr in normal range and within 180 days    Creat  Date Value Ref Range Status  06/21/2021 0.93 0.70 - 1.28 mg/dL Final         Failed - K in normal range and within 180 days    Potassium  Date Value Ref Range Status  06/21/2021 4.3 3.5 - 5.3 mmol/L Final         Failed - Valid encounter within last 6 months    Recent Outpatient Visits           9 months ago Foreign body of left ear, initial encounter   [facility] Pickard, Cammie Mcgee, MD   1 year ago General medical  exam   [place] [facility] Schaumann, Cammie Mcgee, MD   2 years ago Essential hypertension   [place] [facility] Schaumann, Cammie Mcgee, MD   2 years ago [place] Dennard Schaumann, Cammie Mcgee, MD   3 years ago Essential hypertension   [place], Cammie Mcgee, MD              Passed - Patient is not pregnant      Passed - Last BP in normal range    BP Readings from Last 1 Encounters:  04/18/22 130/79          allopurinol (ZYLOPRIM) 300 MG tablet 90 tablet 1    Sig: TAKE 1 TABLET BY MOUTH EVERY DAY     Endocrinology:  Gout Agents - allopurinol Failed - 07/14/2022  2:35 PM      Failed - Uric Acid in normal range and within 360 days    Uric Acid, Serum  Date Value Ref Range Status  06/21/2021 5.6 4.0 - 8.0 mg/dL Final    Comment:    Therapeutic target for gout patients: <6.0 mg/dL .          Failed - Cr in normal range and within 360 days    Creat  Date Value Ref Range Status  06/21/2021 0.93 0.70 - 1.28 mg/dL Final         Failed - CBC within normal  limits and completed in the last 12 months    WBC  Date Value Ref Range Status  06/21/2021 9.6 3.8 - 10.8 Thousand/uL Final   RBC  Date Value Ref Range Status  06/21/2021 5.25 4.20 - 5.80 Million/uL Final   Hemoglobin  Date Value Ref Range Status  06/21/2021 16.3 13.2 - 17.1 g/dL Final  04/27/2020 15.9 13.0 - 17.7 g/dL Final   HCT  Date Value Ref Range Status  06/21/2021 48.6 38.5 - 50.0 % Final   Hematocrit  Date Value Ref Range Status  04/27/2020 48.6 37.5 - 51.0 % Final   MCHC  Date Value Ref Range Status  06/21/2021 33.5 32.0 - 36.0 g/dL Final   [facility]  Date Value Ref Range Status  06/21/2021 31.0 27.0 - 33.0 pg Final   MCV  Date Value Ref Range Status  06/21/2021 92.6 80.0 - 100.0 fL Final  04/27/2020 91 79 - 97 fL Final   No results found for: "PLTCOUNTKUC", "LABPLAT", "POCPLA" RDW  Date Value Ref Range Status  06/21/2021 13.1 11.0 - 15.0 % Final  04/27/2020 13.3 11.6 - 15.4 % Final         Passed - Valid encounter within last 12 months    Recent Outpatient Visits           9 months ago Foreign body of left ear, initial encounter   [place] Pickard, Cammie Mcgee, MD   1 year ago General medical exam   [place] Susy Frizzle, MD   2 years ago Essential hypertension   [place], Warren T, MD   2 years ago [place] [facility] Schaumann, Cammie Mcgee, MD   3 years ago Essential hypertension   [place], Cammie Mcgee, MD

## 2022-08-13 ENCOUNTER — Telehealth: Payer: Medicare PPO | Admitting: Nurse Practitioner

## 2022-08-13 DIAGNOSIS — H1033 Unspecified acute conjunctivitis, bilateral: Secondary | ICD-10-CM | POA: Diagnosis not present

## 2022-08-13 DIAGNOSIS — R0981 Nasal congestion: Secondary | ICD-10-CM

## 2022-08-13 MED ORDER — CIPROFLOXACIN HCL 0.3 % OP OINT: TOPICAL_OINTMENT | Freq: Three times a day (TID) | OPHTHALMIC | 0 refills | Status: DC

## 2022-08-13 MED ORDER — FLUTICASONE PROPIONATE 50 MCG/ACT NA SUSP: 2.0000 | Freq: Every day | NASAL | 6 refills | Status: DC

## 2022-08-13 NOTE — Progress Notes (Signed)
Virtual Visit Consent   Daniel Blackwell, you are scheduled for a virtual visit with Mary-Margaret Hassell Done, [place], a [facility] provider, today.     Just as with appointments in the office, your consent must be obtained to participate.  Your consent will be active for this visit and any virtual visit you may have with one of our providers in the next 365 days.     If you have a MyChart account, a copy of this consent can be sent to you electronically.  All virtual visits are billed to your insurance company just like a traditional visit in the office.    As this is a virtual visit, video technology does not allow for your provider to perform a traditional examination.  This may limit your provider's ability to fully assess your condition.  If your provider identifies any concerns that need to be evaluated in person or the need to arrange testing (such as labs, EKG, etc.), we will make arrangements to do so.     Although advances in technology are sophisticated, we cannot ensure that it will always work on either your end or our end.  If the connection with a video visit is poor, the visit may have to be switched to a telephone visit.  With either a video or telephone visit, we are not always able to ensure that we have a secure connection.     I need to obtain your verbal consent now.   Are you willing to proceed with your visit today? YES   Daniel Blackwell has provided verbal consent on 08/13/2022 for a virtual visit (video or telephone).   Mary-Margaret Hassell Done, FNP   Date: 08/13/2022 10:17 AM   Virtual Visit via Video Note   I, Mary-Margaret Hassell Done, connected with Daniel Blackwell ([id number], March 13, 1951) on 08/13/22 at 10:30 AM EST by a video-enabled telemedicine application and verified that I am speaking with the correct person using two identifiers.  Location: Patient: Virtual Visit Location Patient: Home Provider: Virtual Visit Location Provider: Mobile   I discussed the limitations of  evaluation and management by telemedicine and the availability of in person appointments. The patient expressed understanding and agreed to proceed.    History of Present Illness: Daniel Blackwell is a 72 y.o. who identifies as a male who was assigned male at birth, and is being seen today for cold system.  HPI: Patient has been sick since 10 days ago with could symptoms. He improved. This past Tuesday he woke up with eyes matted  to gether and red. Had  to wipe with wash rag to open. Has continued since then he has rubbed his eyes so much his lids are irritated. Has a runny nose as wel now.    Review of Systems  Eyes:  Positive for discharge and redness. Negative for photophobia.    Problems:  Patient Active Problem List   Diagnosis Date Noted   Impingement syndrome of left shoulder 04/19/2022   Urticaria 04/27/2020   Chronic rhinitis 04/27/2020   Angioedema of lips 04/27/2020   Multiple drug allergies 04/27/2020   Adverse food reaction 04/27/2020   Allergic reaction to alpha-gal    Hyperlipidemia    Hyperglycemia    Multiple allergies 12/01/2010   Hypertension 12/01/2010   Colon polyps 12/01/2010    Allergies:  Allergies  Allergen Reactions   Asa [Aspirin] Swelling   Other Swelling    CASHEWS   Penicillins Rash    Did it involve swelling  of the face/tongue/throat, SOB, or low BP? No Did it involve sudden or severe rash/hives, skin peeling, or any reaction on the inside of your mouth or nose? No Did you need to seek medical attention at a hospital or doctor's office? No When did it last happen?    50 years ago   If all above answers are "NO", may proceed with cephalosporin use.    Sulfa Antibiotics Rash   Medications:  Current Outpatient Medications:    acetaminophen (TYLENOL) 325 MG tablet, Take 2 tablets (650 mg total) by mouth every 6 (six) hours as needed for mild pain., Disp:  , Rfl:    allopurinol (ZYLOPRIM) 300 MG tablet, TAKE 1 TABLET BY MOUTH EVERY DAY, Disp: 90  tablet, Rfl: 1   amLODipine (NORVASC) 5 MG tablet, TAKE 1 TABLET(5 MG) BY MOUTH DAILY, Disp: 90 tablet, Rfl: 3   aspirin 81 MG tablet, Take 81 mg by mouth daily., Disp: , Rfl:    cetirizine (ZYRTEC) 10 MG chewable tablet, Chew 10 mg by mouth daily., Disp: , Rfl:    colchicine 0.6 MG tablet, TAKE 1 TABLET(0.6 MG) BY MOUTH DAILY, Disp: 30 tablet, Rfl: 2   docusate sodium (COLACE) 100 MG capsule, Take 1 capsule (100 mg total) by mouth 2 (two) times daily as needed for mild constipation., Disp: 10 capsule, Rfl: 0   EPINEPHrine 0.3 mg/0.3 mL IJ SOAJ injection, Inject 0.3 mLs (0.3 mg total) into the muscle as needed for anaphylaxis., Disp: 1 each, Rfl: 1   fluticasone (FLONASE) 50 MCG/ACT nasal spray, SHAKE LIQUID AND USE 1 SPRAY IN EACH NOSTRIL IN THE MORNING AND AT BEDTIME, Disp: 48 g, Rfl: 0   indomethacin (INDOCIN) 50 MG capsule, Take 1 capsule (50 mg total) by mouth 3 (three) times daily with meals., Disp: 21 capsule, Rfl: 0   levocetirizine (XYZAL) 5 MG tablet, TAKE 1 TABLET(5 MG) BY MOUTH EVERY EVENING, Disp: 30 tablet, Rfl: 0   lisinopril (ZESTRIL) 20 MG tablet, TAKE 1 TABLET BY MOUTH EVERY DAY, Disp: 90 tablet, Rfl: 0   Multiple Vitamin (MULTIVITAMIN) tablet, Take 1 tablet by mouth daily., Disp: , Rfl:    Omega-3 Fatty Acids (FISH OIL) 1000 MG CPDR, Take 2 capsules by mouth See admin instructions. 2-3 times weekly, Disp: , Rfl:    predniSONE (DELTASONE) 20 MG tablet, 3 tabs poqday 1-2, 2 tabs poqday 3-4, 1 tab poqday 5-6, Disp: 12 tablet, Rfl: 0   rosuvastatin (CRESTOR) 10 MG tablet, TAKE 1 TABLET BY MOUTH EVERY DAY, Disp: 90 tablet, Rfl: 0   vitamin B-12 (CYANOCOBALAMIN) 1000 MCG tablet, Take 1,000 mcg by mouth daily. , Disp: , Rfl:   Current Facility-Administered Medications:    0.9 %  sodium chloride infusion, 500 mL, Intravenous, Once, Pyrtle, Lajuan Lines, MD  Observations/Objective: Patient is well-developed, well-nourished in no acute distress.  Resting comfortably  at home.  Head is  normocephalic, atraumatic.  No labored breathing.  Speech is clear and coherent with logical content.  Patient is alert and oriented at baseline.  Bil scleral injection Bil upper lid edema  Assessment and Plan:  Daniel Blackwell in today with chief complaint of No chief complaint on file.   1. Nasal congestion 1. Take meds as prescribed 2. Use a cool mist humidifier especially during the winter months and when heat has been humid. 3. Use saline nose sprays frequently 4. Saline irrigations of the nose can be very helpful if done frequently.  * 4X daily for 1 week*  *  Use of a nettie pot can be helpful with this. Follow directions with this* 5. Drink plenty of fluids 6. Keep thermostat turn down low 7.For any cough or congestion- mucinex 8. For fever or aces or pains- take tylenol or ibuprofen appropriate for age and weight.  * for fevers greater than 101 orally you may alternate ibuprofen and tylenol every  3 hours.     2. Acute bacterial conjunctivitis of both eyes Good hand washing Cool compresses  Meds ordered this encounter  Medications   ciprofloxacin (CILOXAN) 0.3 % ophthalmic ointment    Sig: Place into both eyes 3 (three) times daily.    Dispense:  3.5 g    Refill:  0    Order Specific Question:   Supervising Provider    Answer:   Chase Picket [id number]   fluticasone (FLONASE) 50 MCG/ACT nasal spray    Sig: Place 2 sprays into both nostrils daily.    Dispense:  16 g    Refill:  6    Order Specific Question:   Supervising Provider    Answer:   Chase Picket [id number]     Follow Up Instructions: I discussed the assessment and treatment plan with the patient. The patient was provided an opportunity to ask questions and all were answered. The patient agreed with the plan and demonstrated an understanding of the instructions.  A copy of instructions were sent to the patient via MyChart.  The patient was advised to call back or seek an in-person evaluation  if the symptoms worsen or if the condition fails to improve as anticipated.  Time:  I spent 12 minutes with the patient via telehealth technology discussing the above problems/concerns.    Mary-Margaret Hassell Done, FNP

## 2022-08-13 NOTE — Patient Instructions (Signed)
Daniel Blackwell, thank you for joining Chevis Pretty, FNP for today's virtual visit.  While this provider is not your primary care provider (PCP), if your PCP is located in our provider database this encounter information will be shared with them immediately following your visit.   [facility] account gives you access to today's visit and all your visits, tests, and labs performed at [facility] " click here if you don't have a [facility] account or go to [email or web address]  Consent: (Patient) Daniel Blackwell provided verbal consent for this virtual visit at the beginning of the encounter.  Current Medications:  Current Outpatient Medications:    ciprofloxacin (CILOXAN) 0.3 % ophthalmic ointment, Place into both eyes 3 (three) times daily., Disp: 3.5 g, Rfl: 0   fluticasone (FLONASE) 50 MCG/ACT nasal spray, Place 2 sprays into both nostrils daily., Disp: 16 g, Rfl: 6   acetaminophen (TYLENOL) 325 MG tablet, Take 2 tablets (650 mg total) by mouth every 6 (six) hours as needed for mild pain., Disp:  , Rfl:    allopurinol (ZYLOPRIM) 300 MG tablet, TAKE 1 TABLET BY MOUTH EVERY DAY, Disp: 90 tablet, Rfl: 1   amLODipine (NORVASC) 5 MG tablet, TAKE 1 TABLET(5 MG) BY MOUTH DAILY, Disp: 90 tablet, Rfl: 3   aspirin 81 MG tablet, Take 81 mg by mouth daily., Disp: , Rfl:    cetirizine (ZYRTEC) 10 MG chewable tablet, Chew 10 mg by mouth daily., Disp: , Rfl:    colchicine 0.6 MG tablet, TAKE 1 TABLET(0.6 MG) BY MOUTH DAILY, Disp: 30 tablet, Rfl: 2   docusate sodium (COLACE) 100 MG capsule, Take 1 capsule (100 mg total) by mouth 2 (two) times daily as needed for mild constipation., Disp: 10 capsule, Rfl: 0   EPINEPHrine 0.3 mg/0.3 mL IJ SOAJ injection, Inject 0.3 mLs (0.3 mg total) into the muscle as needed for anaphylaxis., Disp: 1 each, Rfl: 1   indomethacin (INDOCIN) 50 MG capsule, Take 1 capsule (50 mg total) by mouth 3 (three) times daily with meals., Disp: 21  capsule, Rfl: 0   levocetirizine (XYZAL) 5 MG tablet, TAKE 1 TABLET(5 MG) BY MOUTH EVERY EVENING, Disp: 30 tablet, Rfl: 0   lisinopril (ZESTRIL) 20 MG tablet, TAKE 1 TABLET BY MOUTH EVERY DAY, Disp: 90 tablet, Rfl: 0   Multiple Vitamin (MULTIVITAMIN) tablet, Take 1 tablet by mouth daily., Disp: , Rfl:    Omega-3 Fatty Acids (FISH OIL) 1000 MG CPDR, Take 2 capsules by mouth See admin instructions. 2-3 times weekly, Disp: , Rfl:    predniSONE (DELTASONE) 20 MG tablet, 3 tabs poqday 1-2, 2 tabs poqday 3-4, 1 tab poqday 5-6, Disp: 12 tablet, Rfl: 0   rosuvastatin (CRESTOR) 10 MG tablet, TAKE 1 TABLET BY MOUTH EVERY DAY, Disp: 90 tablet, Rfl: 0   vitamin B-12 (CYANOCOBALAMIN) 1000 MCG tablet, Take 1,000 mcg by mouth daily. , Disp: , Rfl:   Current Facility-Administered Medications:    0.9 %  sodium chloride infusion, 500 mL, Intravenous, Once, Pyrtle, Lajuan Lines, MD   Medications ordered in this encounter:  Meds ordered this encounter  Medications   ciprofloxacin (CILOXAN) 0.3 % ophthalmic ointment    Sig: Place into both eyes 3 (three) times daily.    Dispense:  3.5 g    Refill:  0    Order Specific Question:   Supervising Provider    Answer:   Chase Picket [id number]   fluticasone (FLONASE) 50 MCG/ACT nasal spray    Sig: Place  2 sprays into both nostrils daily.    Dispense:  16 g    Refill:  6    Order Specific Question:   Supervising Provider    Answer:   Chase Picket [id number]     *If you need refills on other medications prior to your next appointment, please contact your pharmacy*  Follow-Up: Call back or seek an in-person evaluation if the symptoms worsen or if the condition fails to improve as anticipated.  Between [id number]  Other Instructions Good handwashing Cool compresses Run humidifier   If you have been instructed to have an in-person evaluation today at a local Urgent Care facility, please use the link below. It will take you to a list of  all of our available [facility], including address, phone number and hours of operation. Please do not delay care.  [facility]  If you or a family member do not have a primary care provider, use the link below to schedule a visit and establish care. When you choose a [facility] primary care physician or advanced practice provider, you gain a long-term partner in health. Find a Primary Care Provider  Learn more about [facility]'s in-office and virtual care options: [facility]

## 2022-08-16 ENCOUNTER — Encounter: Payer: Self-pay | Admitting: Orthopaedic Surgery

## 2022-08-16 ENCOUNTER — Ambulatory Visit: Payer: Medicare PPO | Admitting: Orthopaedic Surgery

## 2022-08-16 VITALS — BP 133/71 | HR 92 | Ht 69.0 in | Wt 215.0 lb

## 2022-08-16 DIAGNOSIS — M7542 Impingement syndrome of left shoulder: Secondary | ICD-10-CM | POA: Diagnosis not present

## 2022-08-16 MED ORDER — BUPIVACAINE HCL 0.25 % IJ SOLN
4.0000 mL | INTRAMUSCULAR | Status: AC | PRN
  Administered 2022-08-16: 4 mL via INTRA_ARTICULAR

## 2022-08-16 MED ORDER — LIDOCAINE HCL 1 % IJ SOLN
0.5000 mL | INTRAMUSCULAR | Status: AC | PRN
  Administered 2022-08-16: .5 mL

## 2022-08-16 MED ORDER — METHYLPREDNISOLONE ACETATE 40 MG/ML IJ SUSP
40.0000 mg | INTRAMUSCULAR | Status: AC | PRN
  Administered 2022-08-16: 40 mg via INTRA_ARTICULAR

## 2022-08-16 NOTE — Progress Notes (Signed)
Office Visit Note   Patient: Daniel Blackwell           Date of Birth: [birth year]           MRN: [id number] Visit Date: 08/16/2022              Requested by: Susy Frizzle, MD [address] [place],  [place] [address] PCP: Susy Frizzle, MD   Assessment & Plan: Visit Diagnoses:  1. Impingement syndrome of left shoulder     Plan: Subacromial injection performed if he has persistent problems he will call and let us know we can proceed with an MRI to rule out partial rotator cuff tear.  Follow-Up Instructions: No follow-ups on file.   Orders:  No orders of the defined types were placed in this encounter.  No orders of the defined types were placed in this encounter.     Procedures: Large Joint Inj on 08/16/2022 1:13 PM Indications: pain Details: 22 G 1.5 in needle  Arthrogram: No  Medications: 4 mL bupivacaine 0.25 %; 40 mg methylPREDNISolone acetate 40 MG/ML; 0.5 mL lidocaine 1 % Outcome: tolerated well, no immediate complications Procedure, treatment alternatives, risks and benefits explained, specific risks discussed. Consent was given by the patient. Immediately prior to procedure a time out was called to verify the correct patient, procedure, equipment, support staff and site/side marked as required. Patient was prepped and draped in the usual sterile fashion.       Clinical Data: No additional findings.   Subjective: Chief Complaint  Patient presents with   Neck - Pain   Left Shoulder - Pain    HPI 72 year old male returns he had previous injection 04/18/2022 and his left shoulder for impingement study did great up and till 2 to 3 weeks ago when he started having some recurrence of pain not as severe as before.  He has noticed pain with outstretched reaching overhead activities no problems with his opposite right shoulder.  He denies associated neck pain or numbness or tingling in his fingers`  Review of Systems 14 point update  unchanged.   Objective: Vital Signs: BP 133/71   Pulse 92   Ht '5\' 9"'$  (1.753 m)   Wt 215 lb (97.5 kg)   BMI 31.75 kg/m   Physical Exam Constitutional:      Appearance: He is well-developed.  HENT:     Head: Normocephalic and atraumatic.     Right Ear: External ear normal.     Left Ear: External ear normal.  Eyes:     Pupils: Pupils are equal, round, and reactive to light.  Neck:     Thyroid: No thyromegaly.     Trachea: No tracheal deviation.  Cardiovascular:     Rate and Rhythm: Normal rate.  Pulmonary:     Effort: Pulmonary effort is normal.     Breath sounds: No wheezing.  Abdominal:     General: Bowel sounds are normal.     Palpations: Abdomen is soft.  Musculoskeletal:     Cervical back: Neck supple.  Skin:    General: Skin is warm and dry.     Capillary Refill: Capillary refill takes less than 2 seconds.  Neurological:     Mental Status: He is alert and oriented to person, place, and time.  Psychiatric:        Behavior: Behavior normal.        Thought Content: Thought content normal.        Judgment: Judgment  normal.     Ortho Exam positive impingement left shoulder negative right long of the biceps nontender no atrophy of the upper extremity.  Negative Hawkins positive Neer.  Station hand is intact good cervical range of motion.  Specialty Comments:  No specialty comments available.  Imaging: No results found.   PMFS History: Patient Active Problem List   Diagnosis Date Noted   Impingement syndrome of left shoulder 04/19/2022   Urticaria 04/27/2020   Chronic rhinitis 04/27/2020   Angioedema of lips 04/27/2020   Multiple drug allergies 04/27/2020   Adverse food reaction 04/27/2020   Allergic reaction to alpha-gal    Hyperlipidemia    Hyperglycemia    Multiple allergies 12/01/2010   Hypertension 12/01/2010   Colon polyps 12/01/2010   Past Medical History:  Diagnosis Date   Allergic reaction to alpha-gal 12/2019   can't eat beef   Allergy     mold/yeast, bee sting   Arthritis 2019   GOUT   Bipolar 1 disorder ([place]) 1996   pt states 25 years ago, pt states not a problem   Colon polyps    Hyperglycemia    Hyperlipidemia    Hypertension     Family History  Problem Relation Age of Onset   Colon cancer Neg Hx    Colon polyps Neg Hx    Esophageal cancer Neg Hx    Rectal cancer Neg Hx    Stomach cancer Neg Hx     Past Surgical History:  Procedure Laterality Date   COLON SURGERY  08/02/2019   Ileocectomy after colonoscopy perforated his bowel.   COLONOSCOPY  07/2019   20 mm polyp , clip placed, bowel perforated at home, pt went to ER and had Ileocectomy   EYE SURGERY     laser eye surgery mar 2018, and april 2018, torn retina   LAPAROTOMY N/A 08/02/2019   Procedure: EXPLORATORY LAPAROTOMY, ILEOCECTOMY;  Surgeon: Ralene Ok, MD;  Location: [facility];  Service: General;  Laterality: N/A;   Social History   Occupational History   Occupation: State, Conservation officer, nature    Comment: [employer]  Tobacco Use   Smoking status: Former    Types: Pipe    Quit date: 1970    Years since quitting: 54.2    Passive exposure: Never   Smokeless tobacco: Never  Vaping Use   Vaping Use: Never used  Substance and Sexual Activity   Alcohol use: Yes    Alcohol/week: 1.0 standard drink of alcohol    Types: 1 drink(s) per week   Drug use: Never   Sexual activity: Yes

## 2022-08-18 ENCOUNTER — Ambulatory Visit (INDEPENDENT_AMBULATORY_CARE_PROVIDER_SITE_OTHER): Payer: Medicare PPO

## 2022-08-18 VITALS — Ht 68.0 in | Wt 218.0 lb

## 2022-08-18 DIAGNOSIS — Z0000 Encounter for general adult medical examination without abnormal findings: Secondary | ICD-10-CM

## 2022-08-18 NOTE — Patient Instructions (Signed)
Daniel Blackwell , Thank you for taking time to come for your Medicare Wellness Visit. I appreciate your ongoing commitment to your health goals. Please review the following plan we discussed and let me know if I can assist you in the future.   These are the goals we discussed:  Goals      Remain active and independent        This is a list of the screening recommended for you and due dates:  Health Maintenance  Topic Date Due   DTaP/Tdap/Td vaccine (3 - Tdap) 12/08/2021   Flu Shot  01/11/2022   COVID-19 Vaccine (5 - 2023-24 season) 02/11/2022   Medicare Annual Wellness Visit  08/18/2023   Colon Cancer Screening  01/26/2024   Pneumonia Vaccine  Completed   Hepatitis C Screening: USPSTF Recommendation to screen - Ages 18-79 yo.  Completed   Zoster (Shingles) Vaccine  Completed   HPV Vaccine  Aged Out    Advanced directives: Please bring a copy of your health care power of attorney and living will to the office to be added to your chart at your convenience.   Conditions/risks identified: Aim for 30 minutes of exercise or brisk walking, 6-8 glasses of water, and 5 servings of fruits and vegetables each day.   Next appointment: Follow up in one year for your annual wellness visit.   Preventive Care 37 Years and Older, Male  Preventive care refers to lifestyle choices and visits with your health care provider that can promote health and wellness. What does preventive care include? A yearly physical exam. This is also called an annual well check. Dental exams once or twice a year. Routine eye exams. Ask your health care provider how often you should have your eyes checked. Personal lifestyle choices, including: Daily care of your teeth and gums. Regular physical activity. Eating a healthy diet. Avoiding tobacco and drug use. Limiting alcohol use. Practicing safe sex. Taking low doses of aspirin every day. Taking vitamin and mineral supplements as recommended by your health care  provider. What happens during an annual well check? The services and screenings done by your health care provider during your annual well check will depend on your age, overall health, lifestyle risk factors, and family history of disease. Counseling  Your health care provider may ask you questions about your: Alcohol use. Tobacco use. Drug use. Emotional well-being. Home and relationship well-being. Sexual activity. Eating habits. History of falls. Memory and ability to understand (cognition). Work and work Statistician. Screening  You may have the following tests or measurements: Height, weight, and BMI. Blood pressure. Lipid and cholesterol levels. These may be checked every 5 years, or more frequently if you are over 83 years old. Skin check. Lung cancer screening. You may have this screening every year starting at age 16 if you have a 30-pack-year history of smoking and currently smoke or have quit within the past 15 years. Fecal occult blood test (FOBT) of the stool. You may have this test every year starting at age 41. Flexible sigmoidoscopy or colonoscopy. You may have a sigmoidoscopy every 5 years or a colonoscopy every 10 years starting at age 49. Prostate cancer screening. Recommendations will vary depending on your family history and other risks. Hepatitis C blood test. Hepatitis B blood test. Sexually transmitted disease (STD) testing. Diabetes screening. This is done by checking your blood sugar (glucose) after you have not eaten for a while (fasting). You may have this done every 1-3 years. Abdominal aortic aneurysm (  AAA) screening. You may need this if you are a current or former smoker. Osteoporosis. You may be screened starting at age 39 if you are at high risk. Talk with your health care provider about your test results, treatment options, and if necessary, the need for more tests. Vaccines  Your health care provider may recommend certain vaccines, such  as: Influenza vaccine. This is recommended every year. Tetanus, diphtheria, and acellular pertussis (Tdap, Td) vaccine. You may need a Td booster every 10 years. Zoster vaccine. You may need this after age 5. Pneumococcal 13-valent conjugate (PCV13) vaccine. One dose is recommended after age 4. Pneumococcal polysaccharide (PPSV23) vaccine. One dose is recommended after age 19. Talk to your health care provider about which screenings and vaccines you need and how often you need them. This information is not intended to replace advice given to you by your health care provider. Make sure you discuss any questions you have with your health care provider. Document Released: 06/26/2015 Document Revised: 02/17/2016 Document Reviewed: 03/31/2015 Elsevier Interactive Patient Education  2017 [place] Prevention in the Home Falls can cause injuries. They can happen to people of all ages. There are many things you can do to make your home safe and to help prevent falls. What can I do on the outside of my home? Regularly fix the edges of walkways and driveways and fix any cracks. Remove anything that might make you trip as you walk through a door, such as a raised step or threshold. Trim any bushes or trees on the path to your home. Use bright outdoor lighting. Clear any walking paths of anything that might make someone trip, such as rocks or tools. Regularly check to see if handrails are loose or broken. Make sure that both sides of any steps have handrails. Any raised decks and porches should have guardrails on the edges. Have any leaves, snow, or ice cleared regularly. Use sand or salt on walking paths during winter. Clean up any spills in your garage right away. This includes oil or grease spills. What can I do in the bathroom? Use night lights. Install grab bars by the toilet and in the tub and shower. Do not use towel bars as grab bars. Use non-skid mats or decals in the tub or  shower. If you need to sit down in the shower, use a plastic, non-slip stool. Keep the floor dry. Clean up any water that spills on the floor as soon as it happens. Remove soap buildup in the tub or shower regularly. Attach bath mats securely with double-sided non-slip rug tape. Do not have throw rugs and other things on the floor that can make you trip. What can I do in the bedroom? Use night lights. Make sure that you have a light by your bed that is easy to reach. Do not use any sheets or blankets that are too big for your bed. They should not hang down onto the floor. Have a firm chair that has side arms. You can use this for support while you get dressed. Do not have throw rugs and other things on the floor that can make you trip. What can I do in the kitchen? Clean up any spills right away. Avoid walking on wet floors. Keep items that you use a lot in easy-to-reach places. If you need to reach something above you, use a strong step stool that has a grab bar. Keep electrical cords out of the way. Do not use floor polish  or wax that makes floors slippery. If you must use wax, use non-skid floor wax. Do not have throw rugs and other things on the floor that can make you trip. What can I do with my stairs? Do not leave any items on the stairs. Make sure that there are handrails on both sides of the stairs and use them. Fix handrails that are broken or loose. Make sure that handrails are as long as the stairways. Check any carpeting to make sure that it is firmly attached to the stairs. Fix any carpet that is loose or worn. Avoid having throw rugs at the top or bottom of the stairs. If you do have throw rugs, attach them to the floor with carpet tape. Make sure that you have a light switch at the top of the stairs and the bottom of the stairs. If you do not have them, ask someone to add them for you. What else can I do to help prevent falls? Wear shoes that: Do not have high heels. Have  rubber bottoms. Are comfortable and fit you well. Are closed at the toe. Do not wear sandals. If you use a stepladder: Make sure that it is fully opened. Do not climb a closed stepladder. Make sure that both sides of the stepladder are locked into place. Ask someone to hold it for you, if possible. Clearly mark and make sure that you can see: Any grab bars or handrails. First and last steps. Where the edge of each step is. Use tools that help you move around (mobility aids) if they are needed. These include: Canes. Walkers. Scooters. Crutches. Turn on the lights when you go into a dark area. Replace any light bulbs as soon as they burn out. Set up your furniture so you have a clear path. Avoid moving your furniture around. If any of your floors are uneven, fix them. If there are any pets around you, be aware of where they are. Review your medicines with your doctor. Some medicines can make you feel dizzy. This can increase your chance of falling. Ask your doctor what other things that you can do to help prevent falls. This information is not intended to replace advice given to you by your health care provider. Make sure you discuss any questions you have with your health care provider. Document Released: 03/26/2009 Document Revised: 11/05/2015 Document Reviewed: 07/04/2014 Elsevier Interactive Patient Education  2017 [employer].

## 2022-08-18 NOTE — Progress Notes (Signed)
Subjective:   Daniel Blackwell is a 72 y.o. male who presents for Medicare Annual/Subsequent preventive examination.  I connected with  Tharon Aquas on 08/18/22 by a audio enabled telemedicine application and verified that I am speaking with the correct person using two identifiers.  Patient Location: Home  Provider Location: Office/Clinic  I discussed the limitations of evaluation and management by telemedicine. The patient expressed understanding and agreed to proceed.  Review of Systems     Cardiac Risk Factors include: advanced age (>86mn, >>67women);male gender;hypertension;dyslipidemia     Objective:    Today's Vitals   08/18/22 1228  Weight: 218 lb (98.9 kg)  Height: '5\' 8"'$  (1.727 m)   Body mass index is 33.15 kg/m.     08/18/2022   12:33 PM 05/29/2020    8:29 AM 08/02/2019   10:12 AM  Advanced Directives  Does Patient Have a Medical Advance Directive? Yes Yes No  Type of Advance Directive Living will;Healthcare Power of Attorney Out of facility DNR (pink MOST or yellow form)   Does patient want to make changes to medical advance directive? No - Patient declined    Copy of HPasadenain Chart? No - copy requested    Would patient like information on creating a medical advance directive?   No - Patient declined    Current Medications (verified) Outpatient Encounter Medications as of 08/18/2022  Medication Sig   acetaminophen (TYLENOL) 325 MG tablet Take 2 tablets (650 mg total) by mouth every 6 (six) hours as needed for mild pain.   allopurinol (ZYLOPRIM) 300 MG tablet TAKE 1 TABLET BY MOUTH EVERY DAY   amLODipine (NORVASC) 5 MG tablet TAKE 1 TABLET(5 MG) BY MOUTH DAILY   aspirin 81 MG tablet Take 81 mg by mouth daily.   cetirizine (ZYRTEC) 10 MG chewable tablet Chew 10 mg by mouth daily.   ciprofloxacin (CILOXAN) 0.3 % ophthalmic ointment Place into both eyes 3 (three) times daily.   colchicine 0.6 MG tablet TAKE 1 TABLET(0.6 MG) BY MOUTH  DAILY   EPINEPHrine 0.3 mg/0.3 mL IJ SOAJ injection Inject 0.3 mLs (0.3 mg total) into the muscle as needed for anaphylaxis.   fluticasone (FLONASE) 50 MCG/ACT nasal spray Place 2 sprays into both nostrils daily.   indomethacin (INDOCIN) 50 MG capsule Take 1 capsule (50 mg total) by mouth 3 (three) times daily with meals.   levocetirizine (XYZAL) 5 MG tablet TAKE 1 TABLET(5 MG) BY MOUTH EVERY EVENING   lisinopril (ZESTRIL) 20 MG tablet TAKE 1 TABLET BY MOUTH EVERY DAY   Multiple Vitamin (MULTIVITAMIN) tablet Take 1 tablet by mouth daily.   Omega-3 Fatty Acids (FISH OIL) 1000 MG CPDR Take 2 capsules by mouth See admin instructions. 2-3 times weekly   rosuvastatin (CRESTOR) 10 MG tablet TAKE 1 TABLET BY MOUTH EVERY DAY   vitamin B-12 (CYANOCOBALAMIN) 1000 MCG tablet Take 1,000 mcg by mouth daily.    [DISCONTINUED] docusate sodium (COLACE) 100 MG capsule Take 1 capsule (100 mg total) by mouth 2 (two) times daily as needed for mild constipation.   [DISCONTINUED] predniSONE (DELTASONE) 20 MG tablet 3 tabs poqday 1-2, 2 tabs poqday 3-4, 1 tab poqday 5-6   Facility-Administered Encounter Medications as of 08/18/2022  Medication   0.9 %  sodium chloride infusion    Allergies (verified) Asa [aspirin], Other, Penicillins, and Sulfa antibiotics   History: Past Medical History:  Diagnosis Date   Allergic reaction to alpha-gal 12/2019   can't eat beef  Allergy    mold/yeast, bee sting   Arthritis 2019   GOUT   Bipolar 1 disorder ([place]) 1996   pt states 25 years ago, pt states not a problem   Colon polyps    Hyperglycemia    Hyperlipidemia    Hypertension    Past Surgical History:  Procedure Laterality Date   COLON SURGERY  08/02/2019   Ileocectomy after colonoscopy perforated his bowel.   COLONOSCOPY  07/2019   20 mm polyp , clip placed, bowel perforated at home, pt went to ER and had Ileocectomy   EYE SURGERY     laser eye surgery mar 2018, and april 2018, torn retina   LAPAROTOMY N/A  08/02/2019   Procedure: EXPLORATORY LAPAROTOMY, ILEOCECTOMY;  Surgeon: Ralene Ok, MD;  Location: [facility];  Service: General;  Laterality: N/A;   Family History  Problem Relation Age of Onset   Colon cancer Neg Hx    Colon polyps Neg Hx    Esophageal cancer Neg Hx    Rectal cancer Neg Hx    Stomach cancer Neg Hx    Social History   Socioeconomic History   Marital status: Married    Spouse name: Not on file   Number of children: Not on file   Years of education: Not on file   Highest education level: Not on file  Occupational History   Occupation: State, Conservation officer, nature    Comment: School System  Tobacco Use   Smoking status: Former    Types: Pipe    Quit date: 1970    Years since quitting: 54.2    Passive exposure: Never   Smokeless tobacco: Never  Vaping Use   Vaping Use: Never used  Substance and Sexual Activity   Alcohol use: Yes    Alcohol/week: 1.0 standard drink of alcohol    Types: 1 drink(s) per week   Drug use: Never   Sexual activity: Yes  Other Topics Concern   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   At certain times of the year he works long hours   but then at certain times of the year works a few hours.   Social Determinants of Health   Financial Resource Strain: Low Risk  (08/18/2022)   Overall Financial Resource Strain (CARDIA)    Difficulty of Paying Living Expenses: Not hard at all  Food Insecurity: No Food Insecurity (08/18/2022)   Hunger Vital Sign    Worried About Running Out of Food in the Last Year: Never true    Ran Out of Food in the Last Year: Never true  Transportation Needs: No Transportation Needs (08/18/2022)   PRAPARE - Hydrologist (Medical): No    Lack of Transportation (Non-Medical): No  Physical Activity: Insufficiently Active (08/18/2022)   Exercise Vital Sign    Days of Exercise  per Week: 3 days    Minutes of Exercise per Session: 20 min  Stress: No Stress Concern Present (08/18/2022)   [place]    Feeling of Stress : Only a little  Social Connections: Unknown (08/18/2022)   Social Connection and Isolation Panel [NHANES]    Frequency of Communication with Friends and Family: Once a week    Frequency of Social Gatherings with Friends and Family: Once a week  Attends Religious Services: Patient refused    Active Member of Clubs or Organizations: Yes    Attends Archivist Meetings: 1 to 4 times per year    Marital Status: Married    Tobacco Counseling Counseling given: Not Answered   Clinical Intake:  Pre-visit preparation completed: Yes  Pain : No/denies pain  Diabetes: No  How often do you need to have someone help you when you read instructions, pamphlets, or other written materials from your doctor or pharmacy?: 1 - Never  Diabetic?No   Interpreter Needed?: No  Information entered by :: Denman George LPN   Activities of Daily Living    08/18/2022   12:33 PM 08/17/2022    2:16 PM  In your present state of health, do you have any difficulty performing the following activities:  Hearing? 0 0  Vision? 0 0  Difficulty concentrating or making decisions? 0 0  Walking or climbing stairs? 0 0  Dressing or bathing? 0 0  Doing errands, shopping? 0 0  Preparing Food and eating ? N N  Using the Toilet? N N  In the past six months, have you accidently leaked urine? N N  Do you have problems with loss of bowel control? N N  Managing your Medications? N N  Managing your Finances? N N  Housekeeping or managing your Housekeeping? N N    Patient Care Team: Susy Frizzle, MD as PCP - General (Family Medicine) [place], [place] Marybelle Killings, MD as Consulting Physician (Orthopedic Surgery) [place] Dolly Tamala Fothergill, DPM as Consulting Physician  (Podiatry)  Indicate any recent Medical Services you may have received from other than [facility] providers in the past year (date may be approximate).     Assessment:   This is a routine wellness examination for [place].  Hearing/Vision screen Hearing Screening - Comments:: Denies hearing difficulties  Vision Screening - Comments:: Wears rx glasses - up to date with routine eye exams    Dietary issues and exercise activities discussed: Current Exercise Habits: Home exercise routine, Type of exercise: walking, Time (Minutes): 30, Frequency (Times/Week): 3, Weekly Exercise (Minutes/Week): 90, Intensity: Mild   Goals Addressed             This Visit's Progress    Remain active and independent        Depression Screen    08/18/2022   12:32 PM 03/08/2022    4:04 PM 05/29/2020    8:30 AM 11/22/2017    9:34 AM 05/18/2017    2:42 PM 11/23/2016    3:10 PM 05/18/2016    3:21 PM  PHQ 2/9 Scores  PHQ - 2 Score 0 1 0 0 0 0 0    Fall Risk    08/18/2022   12:31 PM 08/17/2022    2:16 PM 06/25/2021    3:11 PM 05/29/2020    8:30 AM 11/22/2017    9:34 AM  [place] in the past year? 0 0 0 0 No  Number falls in past yr: 0  0 0   Injury with Fall? 0  0 0   Risk for fall due to : No Fall Risks  History of fall(s)    Follow up Falls prevention discussed;Education provided;Falls evaluation completed        FALL RISK PREVENTION PERTAINING TO THE HOME:  Any stairs in or around the home? Yes  If so, are there any without handrails? No  Home free of loose throw rugs  in walkways, pet beds, electrical cords, etc? Yes  Adequate lighting in your home to reduce risk of falls? Yes   ASSISTIVE DEVICES UTILIZED TO PREVENT FALLS:  Life alert? No  Use of a cane, walker or w/c? No  Grab bars in the bathroom? Yes  Shower chair or bench in shower? No  Elevated toilet seat or a handicapped toilet? Yes   TIMED UP AND GO:  Was the test performed? No . Telephonic visit   Cognitive Function:         08/18/2022   12:34 PM 05/29/2020    8:30 AM  6CIT Screen  What Year? 0 points 0 points  What month? 0 points 0 points  What time? 0 points 0 points  Count back from 20 0 points 0 points  Months in reverse 0 points 0 points  Repeat phrase 0 points 0 points  Total Score 0 points 0 points    Immunizations Immunization History  Administered Date(s) Administered   Fluad Quad(high Dose 65+) 04/05/2019, 05/29/2020, 04/08/2021   Influenza, High Dose Seasonal PF 10/19/2016, 03/31/2017, 04/14/2018   Influenza,inj,Quad PF,6+ Mos 04/03/2013, 05/18/2016   Influenza-Unspecified 03/27/2014, 05/15/2015   Moderna Sars-Covid-2 Vaccination 03/18/2021   PFIZER(Purple Top)SARS-COV-2 Vaccination 07/09/2019, 07/30/2019, 03/09/2020   Pneumococcal Conjugate-13 06/11/2014   Pneumococcal Polysaccharide-23 01/25/2016   Td 12/11/2001, 12/09/2011   Zoster Recombinat (Shingrix) 04/05/2019, 06/05/2019    TDAP status: Due, Education has been provided regarding the importance of this vaccine. Advised may receive this vaccine at local pharmacy or Health Dept. Aware to provide a copy of the vaccination record if obtained from local pharmacy or Health Dept. Verbalized acceptance and understanding.  Flu Vaccine status: Declined, Education has been provided regarding the importance of this vaccine but patient still declined. Advised may receive this vaccine at local pharmacy or Health Dept. Aware to provide a copy of the vaccination record if obtained from local pharmacy or Health Dept. Verbalized acceptance and understanding.  Pneumococcal vaccine status: Up to date  Covid-19 vaccine status: Information provided on how to obtain vaccines.   Qualifies for Shingles Vaccine? Yes   Zostavax completed No   Shingrix Completed?: Yes  Screening Tests Health Maintenance  Topic Date Due   DTaP/Tdap/Td (3 - Tdap) 12/08/2021   COVID-19 Vaccine (5 - 2023-24 season) 02/11/2022   INFLUENZA VACCINE  09/11/2022  (Originally 01/11/2022)   Medicare Annual Wellness (AWV)  08/18/2023   COLONOSCOPY (Pts 45-23yr Insurance coverage will need to be confirmed)  01/26/2024   Pneumonia Vaccine 72 Years old  Completed   Hepatitis C Screening  Completed   Zoster Vaccines- Shingrix  Completed   HPV VACCINES  Aged Out    Health Maintenance  Health Maintenance Due  Topic Date Due   DTaP/Tdap/Td (3 - Tdap) 12/08/2021   COVID-19 Vaccine (5 - 2023-24 season) 02/11/2022    Colorectal cancer screening: Type of screening: Colonoscopy. Completed 01/25/21. Repeat every 3 years  Lung Cancer Screening: (Low Dose CT Chest recommended if Age 74-80years, 30 pack-year currently smoking OR have quit w/in 15years.) does not qualify.   Lung Cancer Screening Referral: n/a  Additional Screening:  Hepatitis C Screening: does qualify; Completed 01/20/16  Vision Screening: Recommended annual ophthalmology exams for early detection of glaucoma and other disorders of the eye. Is the patient up to date with their annual eye exam?  Yes  Who is the provider or what is the name of the office in which the patient attends annual eye exams? [facility]  If pt is not established with a provider, would they like to be referred to a provider to establish care? No .   Dental Screening: Recommended annual dental exams for proper oral hygiene  Community Resource Referral / Chronic Care Management: CRR required this visit?  No   CCM required this visit?  No      Plan:     I have personally reviewed and noted the following in the patient's chart:   Medical and social history Use of alcohol, tobacco or illicit drugs  Current medications and supplements including opioid prescriptions. Patient is not currently taking opioid prescriptions. Functional ability and status Nutritional status Physical activity Advanced directives List of other physicians Hospitalizations, surgeries, and ER visits in previous 12  months Vitals Screenings to include cognitive, depression, and falls Referrals and appointments  In addition, I have reviewed and discussed with patient certain preventive protocols, quality metrics, and best practice recommendations. A written personalized care plan for preventive services as well as general preventive health recommendations were provided to patient.     Vanetta Mulders, [place]   075-GRM   Due to this being a virtual visit, the after visit summary with patients personalized plan was offered to patient via mail or my-chart. Patient would like to access on my-chart  Nurse Notes: No concerns

## 2022-10-04 ENCOUNTER — Other Ambulatory Visit: Payer: Self-pay | Admitting: Family Medicine

## 2022-10-04 NOTE — Telephone Encounter (Signed)
Requested medication (s) are due for refill today - yes  Requested medication (s) are on the active medication list -yes  Future visit scheduled -no  Last refill: 07/04/22 #90  Notes to clinic: Fails lab protocol, last fill has notes- sent for review   Requested Prescriptions  Pending Prescriptions Disp Refills   rosuvastatin (CRESTOR) 10 MG tablet [Pharmacy Med Name: rosuvastatin 10 mg tablet] 90 tablet 0    Sig: TAKE 1 TABLET BY MOUTH EVERY DAY     Cardiovascular:  Antilipid - Statins 2 Failed - 10/04/2022  3:46 PM      Failed - Cr in normal range and within 360 days    Creat  Date Value Ref Range Status  06/21/2021 0.93 0.70 - 1.28 mg/dL Final         Failed - Valid encounter within last 12 months    Recent Outpatient Visits           1 year ago Foreign body of left ear, initial encounter   [facility] Pickard, Priscille Heidelberg, MD   1 year ago General medical exam   [facility] Donita Brooks, MD   2 years ago Essential hypertension   [facility] Tanya Nones, Priscille Heidelberg, MD   3 years ago Urticaria   [facility] Tanya Nones, Priscille Heidelberg, MD   3 years ago Essential hypertension   [facility] Tanya Nones, Priscille Heidelberg, MD              Failed - Lipid Panel in normal range within the last 12 months    Cholesterol  Date Value Ref Range Status  06/21/2021 209 (H) <200 mg/dL Final   LDL Cholesterol (Calc)  Date Value Ref Range Status  06/21/2021 117 (H) mg/dL (calc) Final    Comment:    Reference range: <100 . Desirable range <100 mg/dL for primary prevention;   <70 mg/dL for patients with CHD or diabetic patients  with > or = 2 CHD risk factors. Marland Kitchen LDL-C is now calculated using the Martin-Hopkins  calculation, which is a validated novel method providing  better accuracy than the Friedewald equation in the  estimation of LDL-C.  Horald Pollen et al. Lenox Ahr. 1610;960(45): 2061-2068   (http://education.[facility].com/faq/FAQ164)    HDL  Date Value Ref Range Status  06/21/2021 51 > OR = 40 mg/dL Final   Triglycerides  Date Value Ref Range Status  06/21/2021 287 (H) <150 mg/dL Final    Comment:    . If a non-fasting specimen was collected, consider repeat triglyceride testing on a fasting specimen if clinically indicated.  Perry Mount et al. J. of Clin. Lipidol. 2015;9:129-169. Marland Kitchen          Passed - Patient is not pregnant         Requested Prescriptions  Pending Prescriptions Disp Refills   rosuvastatin (CRESTOR) 10 MG tablet [Pharmacy Med Name: rosuvastatin 10 mg tablet] 90 tablet 0    Sig: TAKE 1 TABLET BY MOUTH EVERY DAY     Cardiovascular:  Antilipid - Statins 2 Failed - 10/04/2022  3:46 PM      Failed - Cr in normal range and within 360 days    Creat  Date Value Ref Range Status  06/21/2021 0.93 0.70 - 1.28 mg/dL Final         Failed - Valid encounter within last 12 months    Recent Outpatient Visits           1  year ago Foreign body of left ear, initial encounter   [facility] Pickard, Priscille Heidelberg, MD   1 year ago General medical exam   [facility] Donita Brooks, MD   2 years ago Essential hypertension   [facility] Tanya Nones, Priscille Heidelberg, MD   3 years ago Urticaria   [facility] Tanya Nones, Priscille Heidelberg, MD   3 years ago Essential hypertension   [facility] Donita Brooks, MD              Failed - Lipid Panel in normal range within the last 12 months    Cholesterol  Date Value Ref Range Status  06/21/2021 209 (H) <200 mg/dL Final   LDL Cholesterol (Calc)  Date Value Ref Range Status  06/21/2021 117 (H) mg/dL (calc) Final    Comment:    Reference range: <100 . Desirable range <100 mg/dL for primary prevention;   <70 mg/dL for patients with CHD or diabetic patients  with > or = 2 CHD risk factors. Marland Kitchen LDL-C is now calculated using the  Martin-Hopkins  calculation, which is a validated novel method providing  better accuracy than the Friedewald equation in the  estimation of LDL-C.  Horald Pollen et al. Lenox Ahr. 1610;960(45): 2061-2068  (http://education.[facility].com/faq/FAQ164)    HDL  Date Value Ref Range Status  06/21/2021 51 > OR = 40 mg/dL Final   Triglycerides  Date Value Ref Range Status  06/21/2021 287 (H) <150 mg/dL Final    Comment:    . If a non-fasting specimen was collected, consider repeat triglyceride testing on a fasting specimen if clinically indicated.  Perry Mount et al. J. of Clin. Lipidol. 2015;9:129-169. Marland Kitchen          Passed - Patient is not pregnant

## 2022-10-06 ENCOUNTER — Other Ambulatory Visit: Payer: Self-pay | Admitting: Family Medicine

## 2022-10-06 DIAGNOSIS — I10 Essential (primary) hypertension: Secondary | ICD-10-CM

## 2022-10-06 NOTE — Telephone Encounter (Signed)
Requested Prescriptions  Pending Prescriptions Disp Refills   rosuvastatin (CRESTOR) 10 MG tablet [Pharmacy Med Name: rosuvastatin 10 mg tablet] 90 tablet 0    Sig: TAKE 1 TABLET BY MOUTH EVERY DAY     Cardiovascular:  Antilipid - Statins 2 Failed - 10/06/2022 12:08 PM      Failed - Cr in normal range and within 360 days    Creat  Date Value Ref Range Status  06/21/2021 0.93 0.70 - 1.28 mg/dL Final         Failed - Valid encounter within last 12 months    Recent Outpatient Visits           1 year ago Foreign body of left ear, initial encounter   [facility] Pickard, Priscille Heidelberg, MD   1 year ago General medical exam   [facility] Donita Brooks, MD   2 years ago Essential hypertension   [facility] Tanya Nones, Priscille Heidelberg, MD   3 years ago Urticaria   [facility] Tanya Nones, Priscille Heidelberg, MD   3 years ago Essential hypertension   [facility] Tanya Nones, Priscille Heidelberg, MD              Failed - Lipid Panel in normal range within the last 12 months    Cholesterol  Date Value Ref Range Status  06/21/2021 209 (H) <200 mg/dL Final   LDL Cholesterol (Calc)  Date Value Ref Range Status  06/21/2021 117 (H) mg/dL (calc) Final    Comment:    Reference range: <100 . Desirable range <100 mg/dL for primary prevention;   <70 mg/dL for patients with CHD or diabetic patients  with > or = 2 CHD risk factors. Marland Kitchen LDL-C is now calculated using the Martin-Hopkins  calculation, which is a validated novel method providing  better accuracy than the Friedewald equation in the  estimation of LDL-C.  Horald Pollen et al. Lenox Ahr. 8295;621(30): 2061-2068  (http://education.[facility].com/faq/FAQ164)    HDL  Date Value Ref Range Status  06/21/2021 51 > OR = 40 mg/dL Final   Triglycerides  Date Value Ref Range Status  06/21/2021 287 (H) <150 mg/dL Final    Comment:    . If a non-fasting specimen was collected,  consider repeat triglyceride testing on a fasting specimen if clinically indicated.  Perry Mount et al. J. of Clin. Lipidol. 2015;9:129-169. Marland Kitchen          Passed - Patient is not pregnant       lisinopril (ZESTRIL) 20 MG tablet [Pharmacy Med Name: lisinopril 20 mg tablet] 90 tablet 0    Sig: TAKE 1 TABLET BY MOUTH EVERY DAY     Cardiovascular:  ACE Inhibitors Failed - 10/06/2022 12:08 PM      Failed - Cr in normal range and within 180 days    Creat  Date Value Ref Range Status  06/21/2021 0.93 0.70 - 1.28 mg/dL Final         Failed - K in normal range and within 180 days    Potassium  Date Value Ref Range Status  06/21/2021 4.3 3.5 - 5.3 mmol/L Final         Failed - Valid encounter within last 6 months    Recent Outpatient Visits           1 year ago Foreign body of left ear, initial encounter   [facility] Pickard, Priscille Heidelberg, MD   1 year ago General medical  exam   [facility] Tanya Nones, Priscille Heidelberg, MD   2 years ago Essential hypertension   [facility] Tanya Nones, Priscille Heidelberg, MD   3 years ago Urticaria   [facility] Tanya Nones, Priscille Heidelberg, MD   3 years ago Essential hypertension   [facility] Pickard, Priscille Heidelberg, MD              Passed - Patient is not pregnant      Passed - Last BP in normal range    BP Readings from Last 1 Encounters:  08/16/22 133/71          amLODipine (NORVASC) 5 MG tablet [Pharmacy Med Name: amlodipine 5 mg tablet] 90 tablet 0    Sig: TAKE 1 TABLET BY MOUTH EVERY DAY     Cardiovascular: Calcium Channel Blockers 2 Failed - 10/06/2022 12:08 PM      Failed - Valid encounter within last 6 months    Recent Outpatient Visits           1 year ago Foreign body of left ear, initial encounter   [facility] Pickard, Priscille Heidelberg, MD   1 year ago General medical exam   [facility] Donita Brooks, MD   2 years ago Essential hypertension   [facility] Tanya Nones, Priscille Heidelberg, MD   3 years ago Urticaria   [facility] Tanya Nones, Priscille Heidelberg, MD   3 years ago Essential hypertension   [facility] Pickard, Priscille Heidelberg, MD              Passed - Last BP in normal range    BP Readings from Last 1 Encounters:  08/16/22 133/71         Passed - Last Heart Rate in normal range    Pulse Readings from Last 1 Encounters:  08/16/22 92

## 2022-11-02 DIAGNOSIS — M9907 Segmental and somatic dysfunction of upper extremity: Secondary | ICD-10-CM | POA: Diagnosis not present

## 2022-11-02 DIAGNOSIS — M9902 Segmental and somatic dysfunction of thoracic region: Secondary | ICD-10-CM | POA: Diagnosis not present

## 2022-11-02 DIAGNOSIS — M546 Pain in thoracic spine: Secondary | ICD-10-CM | POA: Diagnosis not present

## 2022-11-02 DIAGNOSIS — M25512 Pain in left shoulder: Secondary | ICD-10-CM | POA: Diagnosis not present

## 2022-11-04 DIAGNOSIS — M9907 Segmental and somatic dysfunction of upper extremity: Secondary | ICD-10-CM | POA: Diagnosis not present

## 2022-11-04 DIAGNOSIS — M546 Pain in thoracic spine: Secondary | ICD-10-CM | POA: Diagnosis not present

## 2022-11-04 DIAGNOSIS — M25512 Pain in left shoulder: Secondary | ICD-10-CM | POA: Diagnosis not present

## 2022-11-04 DIAGNOSIS — M9902 Segmental and somatic dysfunction of thoracic region: Secondary | ICD-10-CM | POA: Diagnosis not present

## 2022-11-09 DIAGNOSIS — M25512 Pain in left shoulder: Secondary | ICD-10-CM | POA: Diagnosis not present

## 2022-11-09 DIAGNOSIS — M9907 Segmental and somatic dysfunction of upper extremity: Secondary | ICD-10-CM | POA: Diagnosis not present

## 2022-11-09 DIAGNOSIS — M546 Pain in thoracic spine: Secondary | ICD-10-CM | POA: Diagnosis not present

## 2022-11-09 DIAGNOSIS — M9902 Segmental and somatic dysfunction of thoracic region: Secondary | ICD-10-CM | POA: Diagnosis not present

## 2022-11-11 DIAGNOSIS — M9902 Segmental and somatic dysfunction of thoracic region: Secondary | ICD-10-CM | POA: Diagnosis not present

## 2022-11-11 DIAGNOSIS — M9907 Segmental and somatic dysfunction of upper extremity: Secondary | ICD-10-CM | POA: Diagnosis not present

## 2022-11-11 DIAGNOSIS — M546 Pain in thoracic spine: Secondary | ICD-10-CM | POA: Diagnosis not present

## 2022-11-11 DIAGNOSIS — M25512 Pain in left shoulder: Secondary | ICD-10-CM | POA: Diagnosis not present

## 2022-11-16 DIAGNOSIS — M9902 Segmental and somatic dysfunction of thoracic region: Secondary | ICD-10-CM | POA: Diagnosis not present

## 2022-11-16 DIAGNOSIS — M9907 Segmental and somatic dysfunction of upper extremity: Secondary | ICD-10-CM | POA: Diagnosis not present

## 2022-11-16 DIAGNOSIS — M25512 Pain in left shoulder: Secondary | ICD-10-CM | POA: Diagnosis not present

## 2022-11-16 DIAGNOSIS — M546 Pain in thoracic spine: Secondary | ICD-10-CM | POA: Diagnosis not present

## 2022-11-18 DIAGNOSIS — M9902 Segmental and somatic dysfunction of thoracic region: Secondary | ICD-10-CM | POA: Diagnosis not present

## 2022-11-18 DIAGNOSIS — M25512 Pain in left shoulder: Secondary | ICD-10-CM | POA: Diagnosis not present

## 2022-11-18 DIAGNOSIS — M9907 Segmental and somatic dysfunction of upper extremity: Secondary | ICD-10-CM | POA: Diagnosis not present

## 2022-11-18 DIAGNOSIS — M546 Pain in thoracic spine: Secondary | ICD-10-CM | POA: Diagnosis not present

## 2022-11-24 DIAGNOSIS — M9902 Segmental and somatic dysfunction of thoracic region: Secondary | ICD-10-CM | POA: Diagnosis not present

## 2022-11-24 DIAGNOSIS — M9907 Segmental and somatic dysfunction of upper extremity: Secondary | ICD-10-CM | POA: Diagnosis not present

## 2022-11-24 DIAGNOSIS — M25512 Pain in left shoulder: Secondary | ICD-10-CM | POA: Diagnosis not present

## 2022-11-24 DIAGNOSIS — M546 Pain in thoracic spine: Secondary | ICD-10-CM | POA: Diagnosis not present

## 2022-11-30 DIAGNOSIS — M9907 Segmental and somatic dysfunction of upper extremity: Secondary | ICD-10-CM | POA: Diagnosis not present

## 2022-11-30 DIAGNOSIS — M546 Pain in thoracic spine: Secondary | ICD-10-CM | POA: Diagnosis not present

## 2022-11-30 DIAGNOSIS — M25512 Pain in left shoulder: Secondary | ICD-10-CM | POA: Diagnosis not present

## 2022-11-30 DIAGNOSIS — M9902 Segmental and somatic dysfunction of thoracic region: Secondary | ICD-10-CM | POA: Diagnosis not present

## 2022-12-07 DIAGNOSIS — M9907 Segmental and somatic dysfunction of upper extremity: Secondary | ICD-10-CM | POA: Diagnosis not present

## 2022-12-07 DIAGNOSIS — M546 Pain in thoracic spine: Secondary | ICD-10-CM | POA: Diagnosis not present

## 2022-12-07 DIAGNOSIS — M25512 Pain in left shoulder: Secondary | ICD-10-CM | POA: Diagnosis not present

## 2022-12-07 DIAGNOSIS — M9902 Segmental and somatic dysfunction of thoracic region: Secondary | ICD-10-CM | POA: Diagnosis not present

## 2022-12-14 DIAGNOSIS — M9907 Segmental and somatic dysfunction of upper extremity: Secondary | ICD-10-CM | POA: Diagnosis not present

## 2022-12-14 DIAGNOSIS — M25512 Pain in left shoulder: Secondary | ICD-10-CM | POA: Diagnosis not present

## 2022-12-14 DIAGNOSIS — M9902 Segmental and somatic dysfunction of thoracic region: Secondary | ICD-10-CM | POA: Diagnosis not present

## 2022-12-14 DIAGNOSIS — M546 Pain in thoracic spine: Secondary | ICD-10-CM | POA: Diagnosis not present

## 2022-12-29 ENCOUNTER — Other Ambulatory Visit: Payer: Self-pay | Admitting: Family Medicine

## 2022-12-29 NOTE — Telephone Encounter (Signed)
Tried calling pt re: med changes per FN,"will you please let Daniel Blackwell know I sent in protonix and he should stop the omeprazole? thank you."  LVM for pt to call back

## 2023-01-25 DIAGNOSIS — M546 Pain in thoracic spine: Secondary | ICD-10-CM | POA: Diagnosis not present

## 2023-01-25 DIAGNOSIS — M25512 Pain in left shoulder: Secondary | ICD-10-CM | POA: Diagnosis not present

## 2023-01-25 DIAGNOSIS — M9902 Segmental and somatic dysfunction of thoracic region: Secondary | ICD-10-CM | POA: Diagnosis not present

## 2023-01-25 DIAGNOSIS — M9907 Segmental and somatic dysfunction of upper extremity: Secondary | ICD-10-CM | POA: Diagnosis not present

## 2023-02-07 ENCOUNTER — Other Ambulatory Visit: Payer: Self-pay | Admitting: Family Medicine

## 2023-02-07 DIAGNOSIS — I10 Essential (primary) hypertension: Secondary | ICD-10-CM

## 2023-02-08 ENCOUNTER — Other Ambulatory Visit: Payer: Self-pay | Admitting: Family Medicine

## 2023-02-08 NOTE — Telephone Encounter (Signed)
Requested Prescriptions  Pending Prescriptions Disp Refills   lisinopril (ZESTRIL) 20 MG tablet [Pharmacy Med Name: lisinopril 20 mg tablet] 90 tablet 0    Sig: TAKE 1 TABLET BY MOUTH EVERY DAY     Cardiovascular:  ACE Inhibitors Failed - 02/07/2023  1:26 PM      Failed - Cr in normal range and within 180 days    Creat  Date Value Ref Range Status  06/21/2021 0.93 0.70 - 1.28 mg/dL Final         Failed - K in normal range and within 180 days    Potassium  Date Value Ref Range Status  06/21/2021 4.3 3.5 - 5.3 mmol/L Final         Failed - Valid encounter within last 6 months    Recent Outpatient Visits           1 year ago Foreign body of left ear, initial encounter   [facility] Pickard, Priscille Heidelberg, MD   1 year ago General medical exam   [facility] Donita Brooks, MD   2 years ago Essential hypertension   [facility] Tanya Nones, Priscille Heidelberg, MD   3 years ago Urticaria   [facility] Tanya Nones, Priscille Heidelberg, MD   4 years ago Essential hypertension   [facility] Pickard, Priscille Heidelberg, MD              Passed - Patient is not pregnant      Passed - Last BP in normal range    BP Readings from Last 1 Encounters:  08/16/22 133/71          rosuvastatin (CRESTOR) 10 MG tablet [Pharmacy Med Name: rosuvastatin 10 mg tablet] 90 tablet 0    Sig: TAKE 1 TABLET BY MOUTH EVERY DAY     Cardiovascular:  Antilipid - Statins 2 Failed - 02/07/2023  1:26 PM      Failed - Cr in normal range and within 360 days    Creat  Date Value Ref Range Status  06/21/2021 0.93 0.70 - 1.28 mg/dL Final         Failed - Valid encounter within last 12 months    Recent Outpatient Visits           1 year ago Foreign body of left ear, initial encounter   [facility] Pickard, Priscille Heidelberg, MD   1 year ago General medical exam   [facility] Donita Brooks, MD   2 years ago Essential hypertension    [facility] Tanya Nones, Priscille Heidelberg, MD   3 years ago Urticaria   [facility] Tanya Nones, Priscille Heidelberg, MD   4 years ago Essential hypertension   [facility] Tanya Nones, Priscille Heidelberg, MD              Failed - Lipid Panel in normal range within the last 12 months    Cholesterol  Date Value Ref Range Status  06/21/2021 209 (H) <200 mg/dL Final   LDL Cholesterol (Calc)  Date Value Ref Range Status  06/21/2021 117 (H) mg/dL (calc) Final    Comment:    Reference range: <100 . Desirable range <100 mg/dL for primary prevention;   <70 mg/dL for patients with CHD or diabetic patients  with > or = 2 CHD risk factors. Marland Kitchen LDL-C is now calculated using the Martin-Hopkins  calculation, which is a validated novel method  providing  better accuracy than the Friedewald equation in the  estimation of LDL-C.  Horald Pollen et al. Lenox Ahr. 1610;960(45): 2061-2068  (http://education.[facility].com/faq/FAQ164)    HDL  Date Value Ref Range Status  06/21/2021 51 > OR = 40 mg/dL Final   Triglycerides  Date Value Ref Range Status  06/21/2021 287 (H) <150 mg/dL Final    Comment:    . If a non-fasting specimen was collected, consider repeat triglyceride testing on a fasting specimen if clinically indicated.  Perry Mount et al. J. of Clin. Lipidol. 2015;9:129-169. Marland Kitchen          Passed - Patient is not pregnant       amLODipine (NORVASC) 5 MG tablet [Pharmacy Med Name: amlodipine 5 mg tablet] 90 tablet 0    Sig: TAKE 1 TABLET BY MOUTH EVERY DAY     Cardiovascular: Calcium Channel Blockers 2 Failed - 02/07/2023  1:26 PM      Failed - Valid encounter within last 6 months    Recent Outpatient Visits           1 year ago Foreign body of left ear, initial encounter   [facility] Pickard, Priscille Heidelberg, MD   1 year ago General medical exam   [facility] Donita Brooks, MD   2 years ago Essential hypertension   [facility] Tanya Nones, Priscille Heidelberg, MD   3 years ago Urticaria   [facility] Tanya Nones, Priscille Heidelberg, MD   4 years ago Essential hypertension   [facility] Pickard, Priscille Heidelberg, MD              Passed - Last BP in normal range    BP Readings from Last 1 Encounters:  08/16/22 133/71         Passed - Last Heart Rate in normal range    Pulse Readings from Last 1 Encounters:  08/16/22 92

## 2023-02-09 NOTE — Telephone Encounter (Signed)
Requested medication (s) are due for refill today: Yes  Requested medication (s) are on the active medication list: Yes  Last refill:  07/14/22  Future visit scheduled: No  Notes to clinic:  Protocol indicates lab work is needed and OV.    Requested Prescriptions  Pending Prescriptions Disp Refills   allopurinol (ZYLOPRIM) 300 MG tablet [Pharmacy Med Name: allopurinol 300 mg tablet] 90 tablet 1    Sig: TAKE 1 TABLET BY MOUTH EVERY DAY     Endocrinology:  Gout Agents - allopurinol Failed - 02/08/2023  9:17 AM      Failed - Uric Acid in normal range and within 360 days    Uric Acid, Serum  Date Value Ref Range Status  06/21/2021 5.6 4.0 - 8.0 mg/dL Final    Comment:    Therapeutic target for gout patients: <6.0 mg/dL .          Failed - Cr in normal range and within 360 days    Creat  Date Value Ref Range Status  06/21/2021 0.93 0.70 - 1.28 mg/dL Final         Failed - Valid encounter within last 12 months    Recent Outpatient Visits           1 year ago Foreign body of left ear, initial encounter   [facility] Pickard, Priscille Heidelberg, MD   1 year ago General medical exam   [facility] Donita Brooks, MD   2 years ago Essential hypertension   [facility] Tanya Nones, Priscille Heidelberg, MD   3 years ago Urticaria   [facility] Tanya Nones, Priscille Heidelberg, MD   4 years ago Essential hypertension   [facility] Pickard, Priscille Heidelberg, MD              Failed - CBC within normal limits and completed in the last 12 months    WBC  Date Value Ref Range Status  06/21/2021 9.6 3.8 - 10.8 Thousand/uL Final   RBC  Date Value Ref Range Status  06/21/2021 5.25 4.20 - 5.80 Million/uL Final   Hemoglobin  Date Value Ref Range Status  06/21/2021 16.3 13.2 - 17.1 g/dL Final  78/29/5621 30.8 13.0 - 17.7 g/dL Final   HCT  Date Value Ref Range Status  06/21/2021 48.6 38.5 - 50.0 % Final   Hematocrit  Date Value Ref  Range Status  04/27/2020 48.6 37.5 - 51.0 % Final   MCHC  Date Value Ref Range Status  06/21/2021 33.5 32.0 - 36.0 g/dL Final   [facility]  Date Value Ref Range Status  06/21/2021 31.0 27.0 - 33.0 pg Final   MCV  Date Value Ref Range Status  06/21/2021 92.6 80.0 - 100.0 fL Final  04/27/2020 91 79 - 97 fL Final   No results found for: "PLTCOUNTKUC", "LABPLAT", "POCPLA" RDW  Date Value Ref Range Status  06/21/2021 13.1 11.0 - 15.0 % Final  04/27/2020 13.3 11.6 - 15.4 % Final

## 2023-02-22 DIAGNOSIS — M9905 Segmental and somatic dysfunction of pelvic region: Secondary | ICD-10-CM | POA: Diagnosis not present

## 2023-02-22 DIAGNOSIS — M62830 Muscle spasm of back: Secondary | ICD-10-CM | POA: Diagnosis not present

## 2023-02-22 DIAGNOSIS — M9902 Segmental and somatic dysfunction of thoracic region: Secondary | ICD-10-CM | POA: Diagnosis not present

## 2023-02-22 DIAGNOSIS — M9903 Segmental and somatic dysfunction of lumbar region: Secondary | ICD-10-CM | POA: Diagnosis not present

## 2023-02-27 DIAGNOSIS — M9902 Segmental and somatic dysfunction of thoracic region: Secondary | ICD-10-CM | POA: Diagnosis not present

## 2023-02-27 DIAGNOSIS — M62830 Muscle spasm of back: Secondary | ICD-10-CM | POA: Diagnosis not present

## 2023-02-27 DIAGNOSIS — M9903 Segmental and somatic dysfunction of lumbar region: Secondary | ICD-10-CM | POA: Diagnosis not present

## 2023-02-27 DIAGNOSIS — M9905 Segmental and somatic dysfunction of pelvic region: Secondary | ICD-10-CM | POA: Diagnosis not present

## 2023-02-27 DIAGNOSIS — M546 Pain in thoracic spine: Secondary | ICD-10-CM | POA: Diagnosis not present

## 2023-03-06 DIAGNOSIS — M546 Pain in thoracic spine: Secondary | ICD-10-CM | POA: Diagnosis not present

## 2023-03-06 DIAGNOSIS — M9905 Segmental and somatic dysfunction of pelvic region: Secondary | ICD-10-CM | POA: Diagnosis not present

## 2023-03-06 DIAGNOSIS — M9902 Segmental and somatic dysfunction of thoracic region: Secondary | ICD-10-CM | POA: Diagnosis not present

## 2023-03-06 DIAGNOSIS — M62830 Muscle spasm of back: Secondary | ICD-10-CM | POA: Diagnosis not present

## 2023-03-06 DIAGNOSIS — M9903 Segmental and somatic dysfunction of lumbar region: Secondary | ICD-10-CM | POA: Diagnosis not present

## 2023-03-19 ENCOUNTER — Telehealth: Payer: Medicare PPO | Admitting: Physician Assistant

## 2023-03-19 DIAGNOSIS — L0390 Cellulitis, unspecified: Secondary | ICD-10-CM

## 2023-03-19 MED ORDER — DOXYCYCLINE HYCLATE 100 MG PO TABS
100.0000 mg | ORAL_TABLET | Freq: Two times a day (BID) | ORAL | 0 refills | Status: DC
Start: 2023-03-19 — End: 2023-08-22

## 2023-03-19 NOTE — Progress Notes (Signed)
Virtual Visit Consent   Daniel Blackwell, you are scheduled for a virtual visit with a [facility] provider today. Just as with appointments in the office, your consent must be obtained to participate. Your consent will be active for this visit and any virtual visit you may have with one of our providers in the next 365 days. If you have a MyChart account, a copy of this consent can be sent to you electronically.  As this is a virtual visit, video technology does not allow for your provider to perform a traditional examination. This may limit your provider's ability to fully assess your condition. If your provider identifies any concerns that need to be evaluated in person or the need to arrange testing (such as labs, EKG, etc.), we will make arrangements to do so. Although advances in technology are sophisticated, we cannot ensure that it will always work on either your end or our end. If the connection with a video visit is poor, the visit may have to be switched to a telephone visit. With either a video or telephone visit, we are not always able to ensure that we have a secure connection.  By engaging in this virtual visit, you consent to the provision of healthcare and authorize for your insurance to be billed (if applicable) for the services provided during this visit. Depending on your insurance coverage, you may receive a charge related to this service.  I need to obtain your verbal consent now. Are you willing to proceed with your visit today? Daniel Blackwell has provided verbal consent on 03/19/2023 for a virtual visit (video or telephone). Margaretann Loveless, PA-C  Date: 03/19/2023 6:28 PM  Virtual Visit via Video Note   I, Margaretann Loveless, connected with  Daniel Blackwell  ([id number], October 05, 1950) on 03/19/23 at  6:15 PM EDT by a video-enabled telemedicine application and verified that I am speaking with the correct person using two identifiers.  Location: Patient: Virtual Visit Location  Patient: Home Provider: Virtual Visit Location Provider: Home Office   I discussed the limitations of evaluation and management by telemedicine and the availability of in person appointments. The patient expressed understanding and agreed to proceed.    History of Present Illness: Daniel Blackwell is a 72 y.o. who identifies as a male who was assigned male at birth, and is being seen today for abrasion of the right lower extremity, anterior shin. A week ago he was doing some clean-up of debris on his roof following [employer]. He was on a metal extension ladder and has he was coming down he slipped off one rung (it was still raining a little) and scraped the front of his right lower leg. He cleaned it with a spray hydrogen peroxide and put neosporin on it for about 3 days. He has been washing daily with soap and water. The scrape is about 3-4 inches in length and appears to be superficial. The top of the scrape is now covered with dry eschar (scabbing) and healing well. There is some residual bruisng in the surrounding soft tissue, now more of a pale greenish yellow. The bottom of the wound has an area that is still open (no longer draining), but now has redness surrounding the bottom edge that is spreading out further. It is not painful to touch. Denies warmth. Denies fevers, chills, nausea, vomiting.    Problems:  Patient Active Problem List   Diagnosis Date Noted   Impingement syndrome of left shoulder 04/19/2022  Urticaria 04/27/2020   Chronic rhinitis 04/27/2020   Angioedema of lips 04/27/2020   Multiple drug allergies 04/27/2020   Adverse food reaction 04/27/2020   Allergic reaction to alpha-gal    Hyperlipidemia    Hyperglycemia    Multiple allergies 12/01/2010   Hypertension 12/01/2010   Colon polyps 12/01/2010    Allergies:  Allergies  Allergen Reactions   Asa [Aspirin] Swelling   Other Swelling    CASHEWS   Penicillins Rash    Did it involve swelling of the  face/tongue/throat, SOB, or low BP? No Did it involve sudden or severe rash/hives, skin peeling, or any reaction on the inside of your mouth or nose? No Did you need to seek medical attention at a hospital or doctor's office? No When did it last happen?    50 years ago   If all above answers are "NO", may proceed with cephalosporin use.    Sulfa Antibiotics Rash   Medications:  Current Outpatient Medications:    doxycycline (VIBRA-TABS) 100 MG tablet, Take 1 tablet (100 mg total) by mouth 2 (two) times daily., Disp: 14 tablet, Rfl: 0   acetaminophen (TYLENOL) 325 MG tablet, Take 2 tablets (650 mg total) by mouth every 6 (six) hours as needed for mild pain., Disp:  , Rfl:    allopurinol (ZYLOPRIM) 300 MG tablet, TAKE 1 TABLET BY MOUTH EVERY DAY, Disp: 90 tablet, Rfl: 1   amLODipine (NORVASC) 5 MG tablet, TAKE 1 TABLET BY MOUTH EVERY DAY, Disp: 90 tablet, Rfl: 0   aspirin 81 MG tablet, Take 81 mg by mouth daily., Disp: , Rfl:    cetirizine (ZYRTEC) 10 MG chewable tablet, Chew 10 mg by mouth daily., Disp: , Rfl:    ciprofloxacin (CILOXAN) 0.3 % ophthalmic ointment, Place into both eyes 3 (three) times daily., Disp: 3.5 g, Rfl: 0   colchicine 0.6 MG tablet, TAKE 1 TABLET(0.6 MG) BY MOUTH DAILY, Disp: 30 tablet, Rfl: 2   EPINEPHrine 0.3 mg/0.3 mL IJ SOAJ injection, Inject 0.3 mLs (0.3 mg total) into the muscle as needed for anaphylaxis., Disp: 1 each, Rfl: 1   fluticasone (FLONASE) 50 MCG/ACT nasal spray, Place 2 sprays into both nostrils daily., Disp: 16 g, Rfl: 6   indomethacin (INDOCIN) 50 MG capsule, Take 1 capsule (50 mg total) by mouth 3 (three) times daily with meals., Disp: 21 capsule, Rfl: 0   levocetirizine (XYZAL) 5 MG tablet, TAKE 1 TABLET(5 MG) BY MOUTH EVERY EVENING, Disp: 30 tablet, Rfl: 0   lisinopril (ZESTRIL) 20 MG tablet, TAKE 1 TABLET BY MOUTH EVERY DAY, Disp: 90 tablet, Rfl: 0   Multiple Vitamin (MULTIVITAMIN) tablet, Take 1 tablet by mouth daily., Disp: , Rfl:    Omega-3  Fatty Acids (FISH OIL) 1000 MG CPDR, Take 2 capsules by mouth See admin instructions. 2-3 times weekly, Disp: , Rfl:    rosuvastatin (CRESTOR) 10 MG tablet, TAKE 1 TABLET BY MOUTH EVERY DAY, Disp: 90 tablet, Rfl: 0   vitamin B-12 (CYANOCOBALAMIN) 1000 MCG tablet, Take 1,000 mcg by mouth daily. , Disp: , Rfl:   Current Facility-Administered Medications:    0.9 %  sodium chloride infusion, 500 mL, Intravenous, Once, Pyrtle, Carie Caddy, MD  Observations/Objective: Patient is well-developed, well-nourished in no acute distress.  Resting comfortably at home.  Head is normocephalic, atraumatic.  No labored breathing.  Speech is clear and coherent with logical content.  Patient is alert and oriented at baseline.  Abrasion described in HPI  Assessment and Plan: 1. Wound cellulitis -  doxycycline (VIBRA-TABS) 100 MG tablet; Take 1 tablet (100 mg total) by mouth 2 (two) times daily.  Dispense: 14 tablet; Refill: 0  - Since an area of the wound not healing like the rest and with increasing surrounding erythema I will treat with Doxycycline as above - Continue to wash daily with soap and water - Do not apply any more neosporin or hydrogen peroxide as this may delay healing - Elevate legs when at rest - Seek in person evaluation if worsening or fails to improve  Follow Up Instructions: I discussed the assessment and treatment plan with the patient. The patient was provided an opportunity to ask questions and all were answered. The patient agreed with the plan and demonstrated an understanding of the instructions.  A copy of instructions were sent to the patient via MyChart unless otherwise noted below.    The patient was advised to call back or seek an in-person evaluation if the symptoms worsen or if the condition fails to improve as anticipated.   Margaretann Loveless, PA-C

## 2023-03-19 NOTE — Patient Instructions (Signed)
Daniel Blackwell, thank you for joining Margaretann Loveless, PA-C for today's virtual visit.  While this provider is not your primary care provider (PCP), if your PCP is located in our provider database this encounter information will be shared with them immediately following your visit.   A [facility] MyChart account gives you access to today's visit and all your visits, tests, and labs performed at [facility] " click here if you don't have a [facility] MyChart account or go to [email or web address]  Consent: (Patient) Daniel Blackwell provided verbal consent for this virtual visit at the beginning of the encounter.  Current Medications:  Current Outpatient Medications:    doxycycline (VIBRA-TABS) 100 MG tablet, Take 1 tablet (100 mg total) by mouth 2 (two) times daily., Disp: 14 tablet, Rfl: 0   acetaminophen (TYLENOL) 325 MG tablet, Take 2 tablets (650 mg total) by mouth every 6 (six) hours as needed for mild pain., Disp:  , Rfl:    allopurinol (ZYLOPRIM) 300 MG tablet, TAKE 1 TABLET BY MOUTH EVERY DAY, Disp: 90 tablet, Rfl: 1   amLODipine (NORVASC) 5 MG tablet, TAKE 1 TABLET BY MOUTH EVERY DAY, Disp: 90 tablet, Rfl: 0   aspirin 81 MG tablet, Take 81 mg by mouth daily., Disp: , Rfl:    cetirizine (ZYRTEC) 10 MG chewable tablet, Chew 10 mg by mouth daily., Disp: , Rfl:    ciprofloxacin (CILOXAN) 0.3 % ophthalmic ointment, Place into both eyes 3 (three) times daily., Disp: 3.5 g, Rfl: 0   colchicine 0.6 MG tablet, TAKE 1 TABLET(0.6 MG) BY MOUTH DAILY, Disp: 30 tablet, Rfl: 2   EPINEPHrine 0.3 mg/0.3 mL IJ SOAJ injection, Inject 0.3 mLs (0.3 mg total) into the muscle as needed for anaphylaxis., Disp: 1 each, Rfl: 1   fluticasone (FLONASE) 50 MCG/ACT nasal spray, Place 2 sprays into both nostrils daily., Disp: 16 g, Rfl: 6   indomethacin (INDOCIN) 50 MG capsule, Take 1 capsule (50 mg total) by mouth 3 (three) times daily with meals., Disp: 21 capsule, Rfl: 0   levocetirizine  (XYZAL) 5 MG tablet, TAKE 1 TABLET(5 MG) BY MOUTH EVERY EVENING, Disp: 30 tablet, Rfl: 0   lisinopril (ZESTRIL) 20 MG tablet, TAKE 1 TABLET BY MOUTH EVERY DAY, Disp: 90 tablet, Rfl: 0   Multiple Vitamin (MULTIVITAMIN) tablet, Take 1 tablet by mouth daily., Disp: , Rfl:    Omega-3 Fatty Acids (FISH OIL) 1000 MG CPDR, Take 2 capsules by mouth See admin instructions. 2-3 times weekly, Disp: , Rfl:    rosuvastatin (CRESTOR) 10 MG tablet, TAKE 1 TABLET BY MOUTH EVERY DAY, Disp: 90 tablet, Rfl: 0   vitamin B-12 (CYANOCOBALAMIN) 1000 MCG tablet, Take 1,000 mcg by mouth daily. , Disp: , Rfl:   Current Facility-Administered Medications:    0.9 %  sodium chloride infusion, 500 mL, Intravenous, Once, Pyrtle, Carie Caddy, MD   Medications ordered in this encounter:  Meds ordered this encounter  Medications   doxycycline (VIBRA-TABS) 100 MG tablet    Sig: Take 1 tablet (100 mg total) by mouth 2 (two) times daily.    Dispense:  14 tablet    Refill:  0    Order Specific Question:   Supervising Provider    Answer:   Merrilee Jansky [id number]     *If you need refills on other medications prior to your next appointment, please contact your pharmacy*  Follow-Up: Call back or seek an in-person evaluation if the symptoms worsen or if the condition fails to  improve as anticipated.  [facility] [phone]  Other Instructions Abrasion An abrasion is a cut or scrape that affects only the surface of the skin. The injury doesn't go through all the layers of the skin. Still, an abrasion can cause pain because it leaves the skin and its nerves open. Abrasions are usually minor injuries that can be treated at home. You must care for your abrasion to prevent infection. What are the causes? Abrasions happen when something rubs, scrapes, or scratches your skin. This can happen when you fall on a hard or rough surface. Or, when a bush in your yard scratches you. When your skin rubs against something,  some layers of skin may come off. You may also see tiny tears on your skin. What are the signs or symptoms? The main symptom of this condition is a cut or a scrape. The cut or scrape may bleed. It may also appear red or pink. If your abrasion is caused by a fall, there may be a bruise under your cut or scrape. How is this diagnosed? An abrasion is diagnosed with a physical exam. How is this treated? Treatment for this condition depends on how large and deep the abrasion is. In most cases: Your abrasion will be cleaned with water and mild soap. An antibiotic may be put on the wound to prevent infection. A petroleum jelly may be put on the wound to prevent moisture. A bandage may be placed on your abrasion to keep it clean. You may need a tetanus shot. Follow these instructions at home: Medicines Take over-the-counter and prescription medicines only as told by your health care provider. If you were prescribed antibiotics, use them as told by your provider. Do not stop using them even if you start to feel better. Wound care Clean your wound 1-2 times a day, or as told by your provider. Wash your hands for at least 20 seconds with mild soap and water. Do this before and after you clean your wound. If soap and water are not available, use hand sanitizer to clean your hands. Wash your wound using mild soap and water, a wound cleanser, or a saltwater solution. A saltwater solution is also called saline. Do not use hydrogen peroxide or alcohol. These can slow healing. Rinse off the soap. Pat your wound dry with a clean towel. Do not rub your wound. Put an antibiotic ointment on your wound as told by your provider. You may also be told to put on a jelly that prevents moisture from entering the wound. Cover your wound with a bandage as told by your provider. Small or very minor abrasions may not need a bandage. Keep your bandage clean and dry as told by your provider. There are many ways to close and  cover a wound. Follow instructions from your provider about changing and removing your bandage. You may have to change your bandage one or more times a day, or as told by your provider. Check your wound every day for signs of infection. Check for: Redness. Watch for a red streak that spreads out from your wound. Swelling or worse pain. Warmth. Blood, fluid, pus, or a bad smell. Managing pain and swelling  If told, put ice on the injured area. Put ice in a plastic bag. Place a towel between your skin and the bag. Leave the ice on for 20 minutes, 2-3 times a day. If your skin turns bright red, remove the ice right away to prevent skin damage.  The risk of damage is higher if you cannot feel pain, heat, or cold. If possible, raise the injured area above the level of your heart while you are sitting or lying down. General instructions Do not take baths, swim, or use a hot tub until your provider approves. Ask your provider if you may take showers. You may only be allowed to take sponge baths. Do not scratch or pick at scabs that may occur over the wound as it heals. Contact a health care provider if: You got a tetanus shot, and you have swelling, severe pain, redness, or bleeding at the site of your shot. Your pain is worse and medicines do not help. You have a fever. You have any of signs of infection. Get help right away if: You have a red streak spreading away from your wound. This information is not intended to replace advice given to you by your health care provider. Make sure you discuss any questions you have with your health care provider. Document Revised: 08/05/2022 Document Reviewed: 08/05/2022 Elsevier Patient Education  2024 Elsevier Inc.    If you have been instructed to have an in-person evaluation today at a local Urgent Care facility, please use the link below. It will take you to a list of all of our available [facility], including address, phone number and  hours of operation. Please do not delay care.  [facility]  If you or a family member do not have a primary care provider, use the link below to schedule a visit and establish care. When you choose a [facility] primary care physician or advanced practice provider, you gain a long-term partner in health. Find a Primary Care Provider  Learn more about [facility] in-office and virtual care options: [facility] - Get Care Now

## 2023-04-14 ENCOUNTER — Other Ambulatory Visit: Payer: Self-pay | Admitting: Family Medicine

## 2023-04-14 NOTE — Telephone Encounter (Signed)
Requested medication (s) are due for refill today: Yes  Requested medication (s) are on the active medication list: Yes  Last refill:  12/29/22  Future visit scheduled: No  Notes to clinic:  Unable to refill per protocol due to failed labs, no updated results.      Requested Prescriptions  Pending Prescriptions Disp Refills   rosuvastatin (CRESTOR) 10 MG tablet [Pharmacy Med Name: rosuvastatin 10 mg tablet] 90 tablet 0    Sig: TAKE 1 TABLET BY MOUTH EVERY DAY     Cardiovascular:  Antilipid - Statins 2 Failed - 04/14/2023  2:01 PM      Failed - Cr in normal range and within 360 days    Creat  Date Value Ref Range Status  06/21/2021 0.93 0.70 - 1.28 mg/dL Final         Failed - Valid encounter within last 12 months    Recent Outpatient Visits           1 year ago Foreign body of left ear, initial encounter   [facility] Pickard, Priscille Heidelberg, MD   1 year ago General medical exam   [facility] Donita Brooks, MD   2 years ago Essential hypertension   [facility] Tanya Nones, Priscille Heidelberg, MD   3 years ago Urticaria   [facility] Tanya Nones, Priscille Heidelberg, MD   4 years ago Essential hypertension   [facility] Tanya Nones, Priscille Heidelberg, MD              Failed - Lipid Panel in normal range within the last 12 months    Cholesterol  Date Value Ref Range Status  06/21/2021 209 (H) <200 mg/dL Final   LDL Cholesterol (Calc)  Date Value Ref Range Status  06/21/2021 117 (H) mg/dL (calc) Final    Comment:    Reference range: <100 . Desirable range <100 mg/dL for primary prevention;   <70 mg/dL for patients with CHD or diabetic patients  with > or = 2 CHD risk factors. Marland Kitchen LDL-C is now calculated using the Martin-Hopkins  calculation, which is a validated novel method providing  better accuracy than the Friedewald equation in the  estimation of LDL-C.  Horald Pollen et al. Lenox Ahr. 2956;213(08): 2061-2068   (http://education.[employer].com/faq/FAQ164)    HDL  Date Value Ref Range Status  06/21/2021 51 > OR = 40 mg/dL Final   Triglycerides  Date Value Ref Range Status  06/21/2021 287 (H) <150 mg/dL Final    Comment:    . If a non-fasting specimen was collected, consider repeat triglyceride testing on a fasting specimen if clinically indicated.  Perry Mount et al. J. of Clin. Lipidol. 2015;9:129-169. Marland Kitchen          Passed - Patient is not pregnant

## 2023-04-27 ENCOUNTER — Other Ambulatory Visit: Payer: Self-pay | Admitting: Family Medicine

## 2023-04-27 DIAGNOSIS — I10 Essential (primary) hypertension: Secondary | ICD-10-CM

## 2023-04-28 NOTE — Telephone Encounter (Signed)
Requested medication (s) are due for refill today - yes  Requested medication (s) are on the active medication list -yes  Future visit scheduled -no  Last refill: 02/08/23 #90  Notes to clinic: Last OV with provider- 03/08/22- needs to be seen- fails lab protocol-over 1 year-06/21/21  Requested Prescriptions  Pending Prescriptions Disp Refills   amLODipine (NORVASC) 5 MG tablet [Pharmacy Med Name: amlodipine 5 mg tablet] 90 tablet 0    Sig: TAKE 1 TABLET BY MOUTH ONCE DAILY     Cardiovascular: Calcium Channel Blockers 2 Failed - 04/27/2023  6:10 PM      Failed - Valid encounter within last 6 months    Recent Outpatient Visits           1 year ago Foreign body of left ear, initial encounter   [facility] Pickard, Priscille Heidelberg, MD   1 year ago General medical exam   [facility] Donita Brooks, MD   2 years ago Essential hypertension   [facility] Donita Brooks, MD   3 years ago Urticaria   [facility] Donita Brooks, MD   4 years ago Essential hypertension   [facility] Pickard, Priscille Heidelberg, MD              Passed - Last BP in normal range    BP Readings from Last 1 Encounters:  08/16/22 133/71         Passed - Last Heart Rate in normal range    Pulse Readings from Last 1 Encounters:  08/16/22 92          lisinopril (ZESTRIL) 20 MG tablet [Pharmacy Med Name: lisinopril 20 mg tablet] 90 tablet 0    Sig: TAKE 1 TABLET BY MOUTH ONCE DAILY     Cardiovascular:  ACE Inhibitors Failed - 04/27/2023  6:10 PM      Failed - Cr in normal range and within 180 days    Creat  Date Value Ref Range Status  06/21/2021 0.93 0.70 - 1.28 mg/dL Final         Failed - K in normal range and within 180 days    Potassium  Date Value Ref Range Status  06/21/2021 4.3 3.5 - 5.3 mmol/L Final         Failed - Valid encounter within last 6 months    Recent Outpatient Visits           1 year ago  Foreign body of left ear, initial encounter   [facility] Pickard, Priscille Heidelberg, MD   1 year ago General medical exam   [facility] Donita Brooks, MD   2 years ago Essential hypertension   [facility] Tanya Nones, Priscille Heidelberg, MD   3 years ago Urticaria   [facility] Tanya Nones, Priscille Heidelberg, MD   4 years ago Essential hypertension   [facility] Pickard, Priscille Heidelberg, MD              Passed - Patient is not pregnant      Passed - Last BP in normal range    BP Readings from Last 1 Encounters:  08/16/22 133/71            Requested Prescriptions  Pending Prescriptions Disp Refills   amLODipine (NORVASC) 5 MG tablet [Pharmacy Med Name: amlodipine 5 mg tablet] 90 tablet 0    Sig: TAKE 1  TABLET BY MOUTH ONCE DAILY     Cardiovascular: Calcium Channel Blockers 2 Failed - 04/27/2023  6:10 PM      Failed - Valid encounter within last 6 months    Recent Outpatient Visits           1 year ago Foreign body of left ear, initial encounter   [facility] Pickard, Priscille Heidelberg, MD   1 year ago General medical exam   [facility] Donita Brooks, MD   2 years ago Essential hypertension   [facility] Donita Brooks, MD   3 years ago Urticaria   [facility] Donita Brooks, MD   4 years ago Essential hypertension   [facility] Pickard, Priscille Heidelberg, MD              Passed - Last BP in normal range    BP Readings from Last 1 Encounters:  08/16/22 133/71         Passed - Last Heart Rate in normal range    Pulse Readings from Last 1 Encounters:  08/16/22 92          lisinopril (ZESTRIL) 20 MG tablet [Pharmacy Med Name: lisinopril 20 mg tablet] 90 tablet 0    Sig: TAKE 1 TABLET BY MOUTH ONCE DAILY     Cardiovascular:  ACE Inhibitors Failed - 04/27/2023  6:10 PM      Failed - Cr in normal range and within 180 days    Creat   Date Value Ref Range Status  06/21/2021 0.93 0.70 - 1.28 mg/dL Final         Failed - K in normal range and within 180 days    Potassium  Date Value Ref Range Status  06/21/2021 4.3 3.5 - 5.3 mmol/L Final         Failed - Valid encounter within last 6 months    Recent Outpatient Visits           1 year ago Foreign body of left ear, initial encounter   [facility] Pickard, Priscille Heidelberg, MD   1 year ago General medical exam   [facility] Donita Brooks, MD   2 years ago Essential hypertension   [facility] Tanya Nones, Priscille Heidelberg, MD   3 years ago Urticaria   [facility] Tanya Nones, Priscille Heidelberg, MD   4 years ago Essential hypertension   [facility] Pickard, Priscille Heidelberg, MD              Passed - Patient is not pregnant      Passed - Last BP in normal range    BP Readings from Last 1 Encounters:  08/16/22 133/71

## 2023-05-30 ENCOUNTER — Other Ambulatory Visit: Payer: Self-pay

## 2023-05-30 NOTE — Telephone Encounter (Signed)
Prescription Request  05/30/2023  LOV: 03/08/22  What is the name of the medication or equipment? rosuvastatin (CRESTOR) 10 MG tablet [[id number]]   Have you contacted your pharmacy to request a refill? Yes   Which pharmacy would you like this sent to?  [facility] - [place], [place] - [address] [place] [address] Phone: [phone] Fax: [phone]    Patient notified that their request is being sent to the clinical staff for review and that they should receive a response within 2 business days.   Please advise at [facility] [phone]

## 2023-05-30 NOTE — Telephone Encounter (Signed)
Requested medications are due for refill today.  yes  Requested medications are on the active medications list.  yes  Last refill. 12/29/2022 #90 0 rf  Future visit scheduled.   no  Notes to clinic.  Pt is overdue for a cpe. Labs are expired.    Requested Prescriptions  Pending Prescriptions Disp Refills   rosuvastatin (CRESTOR) 10 MG tablet 90 tablet 0    Sig: Take 1 tablet (10 mg total) by mouth daily.     Cardiovascular:  Antilipid - Statins 2 Failed - 05/30/2023  5:39 PM      Failed - Cr in normal range and within 360 days    Creat  Date Value Ref Range Status  06/21/2021 0.93 0.70 - 1.28 mg/dL Final         Failed - Valid encounter within last 12 months    Recent Outpatient Visits           1 year ago Foreign body of left ear, initial encounter   [facility] Pickard, Priscille Heidelberg, MD   1 year ago General medical exam   [facility] Donita Brooks, MD   3 years ago Essential hypertension   [facility] Tanya Nones, Priscille Heidelberg, MD   3 years ago Urticaria   [facility] Tanya Nones, Priscille Heidelberg, MD   4 years ago Essential hypertension   [facility] Tanya Nones, Priscille Heidelberg, MD              Failed - Lipid Panel in normal range within the last 12 months    Cholesterol  Date Value Ref Range Status  06/21/2021 209 (H) <200 mg/dL Final   LDL Cholesterol (Calc)  Date Value Ref Range Status  06/21/2021 117 (H) mg/dL (calc) Final    Comment:    Reference range: <100 . Desirable range <100 mg/dL for primary prevention;   <70 mg/dL for patients with CHD or diabetic patients  with > or = 2 CHD risk factors. Marland Kitchen LDL-C is now calculated using the Martin-Hopkins  calculation, which is a validated novel method providing  better accuracy than the Friedewald equation in the  estimation of LDL-C.  Horald Pollen et al. Lenox Ahr. 1610;960(45): 2061-2068  (http://education.[facility].com/faq/FAQ164)    HDL  Date  Value Ref Range Status  06/21/2021 51 > OR = 40 mg/dL Final   Triglycerides  Date Value Ref Range Status  06/21/2021 287 (H) <150 mg/dL Final    Comment:    . If a non-fasting specimen was collected, consider repeat triglyceride testing on a fasting specimen if clinically indicated.  Perry Mount et al. J. of Clin. Lipidol. 2015;9:129-169. Marland Kitchen          Passed - Patient is not pregnant

## 2023-07-03 ENCOUNTER — Telehealth: Payer: Self-pay

## 2023-07-03 ENCOUNTER — Other Ambulatory Visit: Payer: Self-pay

## 2023-07-03 DIAGNOSIS — I10 Essential (primary) hypertension: Secondary | ICD-10-CM

## 2023-07-03 MED ORDER — LISINOPRIL 20 MG PO TABS: 20.0000 mg | ORAL_TABLET | Freq: Every day | ORAL | 0 refills | Status: DC

## 2023-07-03 NOTE — Telephone Encounter (Signed)
Prescription Request  07/03/2023  LOV: 03/08/22 UPCOMING CPE 07/20/23  What is the name of the medication or equipment? lisinopril (ZESTRIL) 20 MG tablet [[id number]]  Have you contacted your pharmacy to request a refill? Yes   Which pharmacy would you like this sent to?  [facility] - [place], [place] - [address] [place] [address] Phone: [phone] Fax: [phone]    Patient notified that their request is being sent to the clinical staff for review and that they should receive a response within 2 business days.   Please advise at [facility] [phone]  Prescription Request  07/03/2023  LOV:03/08/22 UPCOMING CPE 07/20/23  What is the name of the medication or equipment? amLODipine (NORVASC) 5 MG tablet [[id number]]  Have you contacted your pharmacy to request a refill? Yes   Which pharmacy would you like this sent to?  [facility] - [place], [place] - [address] [place] [address] Phone: [phone] Fax: [phone]    Patient notified that their request is being sent to the clinical staff for review and that they should receive a response within 2 business days.   Please advise at [facility] [phone]

## 2023-07-03 NOTE — Telephone Encounter (Signed)
Prescription Request  07/03/2023  LOV: 03/08/22 upcoming CPE 07/20/23  What is the name of the medication or equipment? rosuvastatin (CRESTOR) 10 MG tablet [[id number]]  Have you contacted your pharmacy to request a refill? Yes   Which pharmacy would you like this sent to?  [facility] - [place], [place] - [address] [place] [address] Phone: [phone] Fax: [phone]    Patient notified that their request is being sent to the clinical staff for review and that they should receive a response within 2 business days.   Please advise at [facility] [phone]

## 2023-07-20 ENCOUNTER — Encounter: Payer: Medicare PPO | Admitting: Family Medicine

## 2023-07-20 ENCOUNTER — Other Ambulatory Visit: Payer: Self-pay | Admitting: Family Medicine

## 2023-07-20 NOTE — Telephone Encounter (Signed)
 Copied from CRM [id number]. Topic: Clinical - Medication Refill >> Jul 20, 2023  2:55 PM Georgia RAMAN wrote: Most Recent Primary Care Visit:  Provider: LAVELLE CHARMAINE NOVAK  Department: [facility]  Visit Type: MEDICARE AWV, SEQUENTIAL  Date: 08/18/2022  Medication: amLODipine  (NORVASC ) 5 MG tablet [[id number]] rosuvastatin  (CRESTOR ) 10 MG tablet [[id number]]  Has the patient contacted their pharmacy? Yes (Agent: If no, request that the patient contact the pharmacy for the refill. If patient does not wish to contact the pharmacy document the reason why and proceed with request.) (Agent: If yes, when and what did the pharmacy advise?)  Is this the correct pharmacy for this prescription? Yes If no, delete pharmacy and type the correct one.  This is the patient's preferred pharmacy:  [facility] - [place], [place] - [address] [place] [address] Phone: [phone] Fax: [phone]   Has the prescription been filled recently? No  Is the patient out of the medication? Yes  Has the patient been seen for an appointment in the last year OR does the patient have an upcoming appointment? Yes  Can we respond through MyChart? Yes  Agent: Please be advised that Rx refills may take up to 3 business days. We ask that you follow-up with your pharmacy.

## 2023-07-21 ENCOUNTER — Other Ambulatory Visit: Payer: Self-pay | Admitting: Family Medicine

## 2023-07-21 DIAGNOSIS — I10 Essential (primary) hypertension: Secondary | ICD-10-CM

## 2023-08-08 ENCOUNTER — Other Ambulatory Visit: Payer: Self-pay | Admitting: Family Medicine

## 2023-08-09 ENCOUNTER — Other Ambulatory Visit: Payer: Self-pay | Admitting: Family Medicine

## 2023-08-09 DIAGNOSIS — I10 Essential (primary) hypertension: Secondary | ICD-10-CM

## 2023-08-18 ENCOUNTER — Encounter

## 2023-08-22 ENCOUNTER — Ambulatory Visit (INDEPENDENT_AMBULATORY_CARE_PROVIDER_SITE_OTHER): Payer: Medicare PPO | Admitting: Family Medicine

## 2023-08-22 VITALS — BP 150/86 | HR 80 | Temp 98.4°F | Resp 16 | Ht 68.0 in | Wt 217.0 lb

## 2023-08-22 DIAGNOSIS — E118 Type 2 diabetes mellitus with unspecified complications: Secondary | ICD-10-CM | POA: Diagnosis not present

## 2023-08-22 DIAGNOSIS — Z0000 Encounter for general adult medical examination without abnormal findings: Secondary | ICD-10-CM

## 2023-08-22 DIAGNOSIS — Z1211 Encounter for screening for malignant neoplasm of colon: Secondary | ICD-10-CM | POA: Diagnosis not present

## 2023-08-22 DIAGNOSIS — I10 Essential (primary) hypertension: Secondary | ICD-10-CM

## 2023-08-22 DIAGNOSIS — Z0001 Encounter for general adult medical examination with abnormal findings: Secondary | ICD-10-CM | POA: Diagnosis not present

## 2023-08-22 DIAGNOSIS — Z125 Encounter for screening for malignant neoplasm of prostate: Secondary | ICD-10-CM | POA: Diagnosis not present

## 2023-08-22 MED ORDER — AMLODIPINE BESYLATE 5 MG PO TABS: ORAL_TABLET | ORAL | 3 refills | Status: AC

## 2023-08-22 NOTE — Progress Notes (Signed)
 Subjective:    Patient ID: Daniel Blackwell, male    DOB: [birth year], 73 y.o.   MRN: [id number]  HPI  Patient is here today for a wellness visit.  His blood pressure is elevated today at 150 systolic.  He has been taking lisinopril but he has not been taking amlodipine.  He is checking his blood sugar and his blood sugars typically around 130 fasting and under 162 hours postprandial.  He denies any polyuria polydipsia or blurry vision.  He does have some mild neuropathy on diabetic foot exam.  He is able to sense the 10 mg monofilament in his right foot however he has diminished sensation in his left foot.  His pneumonia vaccine and shingles vaccine are up-to-date.  He declines a flu shot today.  He is due for RSV and tetanus.  He will consider that.  His last colonoscopy was in 2022.  He is due to repeat that.  He is also due for prostate cancer screening.   Past Medical History:  Diagnosis Date   Allergic reaction to alpha-gal 12/2019   can't eat beef   Allergy    mold/yeast, bee sting   Arthritis 2019   GOUT   Bipolar 1 disorder (HCC) 1996   pt states 25 years ago, pt states not a problem   Colon polyps    Hyperglycemia    Hyperlipidemia    Hypertension    Past Surgical History:  Procedure Laterality Date   COLON SURGERY  08/02/2019   Ileocectomy after colonoscopy perforated his bowel.   COLONOSCOPY  07/2019   20 mm polyp , clip placed, bowel perforated at home, pt went to ER and had Ileocectomy   EYE SURGERY     laser eye surgery mar 2018, and april 2018, torn retina   LAPAROTOMY N/A 08/02/2019   Procedure: EXPLORATORY LAPAROTOMY, ILEOCECTOMY;  Surgeon: Axel Filler, MD;  Location: [facility] OR;  Service: General;  Laterality: N/A;    Allergies  Allergen Reactions   Asa [Aspirin] Swelling   Other Swelling    CASHEWS   Penicillins Rash    Did it involve swelling of the face/tongue/throat, SOB, or low BP? No Did it involve sudden or severe rash/hives, skin peeling, or any  reaction on the inside of your mouth or nose? No Did you need to seek medical attention at a hospital or doctor's office? No When did it last happen?    50 years ago   If all above answers are "NO", may proceed with cephalosporin use.    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   At certain times of the year he works long hours   but then at certain times of the year works a few hours.   Social Drivers of Corporate investment banker Strain: Low Risk  (08/18/2023)   Overall Financial Resource Strain (CARDIA)    Difficulty of Paying Living Expenses: Not hard at all  Food Insecurity: No Food Insecurity (08/18/2023)   Hunger Vital Sign    Worried About Running Out of Food in the Last Year: Never true    Ran Out of Food in the Last Year: Never true  Transportation Needs: No Transportation Needs (08/18/2023)   PRAPARE - Administrator, Civil Service (Medical): No    Lack of Transportation (Non-Medical): No  Physical Activity: Inactive (08/18/2023)   Exercise Vital Sign    Days of Exercise per Week: 3 days    Minutes of Exercise per Session: 0 min  Stress: No Stress  Concern Present (08/18/2023)   [employer] of Occupational Health - Occupational Stress Questionnaire    Feeling of Stress : Only a little  Social Connections: Unknown (08/18/2023)   Social Connection and Isolation Panel [NHANES]    Frequency of Communication with Friends and Family: Twice a week    Frequency of Social Gatherings with Friends and Family: Patient declined    Attends Religious Services: Patient declined    Database administrator or Organizations: Yes    Attends Banker Meetings: 1 to 4 times per year    Marital Status: Married  Catering manager Violence: Not At Risk (08/18/2022)   Humiliation, Afraid, Rape, and Kick questionnaire    Fear of Current or Ex-Partner: No    Emotionally Abused: No    Physically Abused: No    Sexually Abused: No   Family History  Problem Relation Age of Onset   Colon cancer Neg Hx    Colon polyps Neg Hx    Esophageal cancer Neg Hx    Rectal cancer Neg Hx    Stomach cancer Neg Hx      Review of Systems  All other systems reviewed and are negative.      Objective:   Physical Exam Vitals reviewed.  Constitutional:      General: He is not in acute distress.    Appearance: Normal appearance. He is obese. He is not ill-appearing, toxic-appearing or diaphoretic.  HENT:     Head: Normocephalic and atraumatic.     Right Ear: Tympanic membrane and ear canal normal. There is no impacted cerumen.     Left Ear: Tympanic membrane and ear canal normal. There is no impacted cerumen.     Nose: Nose normal. No congestion or rhinorrhea.     Mouth/Throat:     Mouth: Mucous membranes are moist.     Pharynx: No oropharyngeal exudate or posterior oropharyngeal erythema.  Eyes:     General: No scleral icterus.       Right eye: No discharge.        Left eye: No discharge.     Extraocular Movements: Extraocular movements intact.     Conjunctiva/sclera: Conjunctivae normal.     Pupils: Pupils are equal, round, and reactive to light.   Neck:     Vascular: No carotid bruit.  Cardiovascular:     Rate and Rhythm: Normal rate and regular rhythm.     Pulses: Normal pulses.  Heart sounds: Normal heart sounds. No murmur heard.    No friction rub. No gallop.  Pulmonary:     Effort: Pulmonary effort is normal. No respiratory distress.     Breath sounds: Normal breath sounds. No stridor. No wheezing, rhonchi or rales.  Chest:     Chest wall: No tenderness.  Abdominal:     General: Abdomen is flat. Bowel sounds are normal. There is no distension.     Palpations: Abdomen is soft.     Tenderness: There is no abdominal tenderness. There is no guarding or rebound.  Musculoskeletal:        General: Normal range of motion.     Cervical back: Normal range of motion and neck supple. No rigidity or tenderness.     Right lower leg: No edema.     Left lower leg: No edema.  Lymphadenopathy:     Cervical: No cervical adenopathy.  Skin:    General: Skin is warm.     Coloration: Skin is not jaundiced or pale.     Findings: No bruising, erythema, lesion or rash.  Neurological:     General: No focal deficit present.     Mental Status: He is alert and oriented to person, place, and time. Mental status is at baseline.     Cranial Nerves: No cranial nerve deficit.     Motor: No weakness.     Coordination: Coordination normal.     Gait: Gait normal.     Deep Tendon Reflexes: Reflexes normal.  Psychiatric:        Mood and Affect: Mood normal.        Behavior: Behavior normal.        Thought Content: Thought content normal.        Judgment: Judgment normal.           Assessment & Plan:  Colon cancer screening - Plan: Ambulatory referral to Gastroenterology  Essential hypertension - Plan: amLODipine (NORVASC) 5 MG tablet  Controlled type 2 diabetes mellitus with complication, without long-term current use of insulin (HCC) - Plan: CBC with Differential/Platelet, COMPLETE METABOLIC PANEL WITH GFR, Lipid panel, Hemoglobin A1c,  Microalbumin/Creatinine Ratio, Urine  Prostate cancer screening - Plan: PSA  Encounter for Medicare annual wellness exam Patient is here for annual wellness exam.  Recommended RSV.  Recommended flu shot.  Recommended tetanus shot.  Will schedule the patient for a colonoscopy.  Diabetic foot exam was performed today.  Check CBC CMP lipid panel A1c and urine protein creatinine ratio.  Screen for prostate cancer with PSA.  Goal A1c is less than 6.5.  Goal LDL is less than 100.  Blood pressure is elevated.  Resume amlodipine 5 mg a day and recheck blood pressure in 2 weeks.

## 2023-08-23 LAB — CBC WITH DIFFERENTIAL/PLATELET
Absolute Lymphocytes: 3096 {cells}/uL (ref 850–3900)
Absolute Monocytes: 675 {cells}/uL (ref 200–950)
Basophils Absolute: 90 {cells}/uL (ref 0–200)
Basophils Relative: 1 %
Eosinophils Absolute: 324 {cells}/uL (ref 15–500)
Eosinophils Relative: 3.6 %
HCT: 48.1 % (ref 38.5–50.0)
Hemoglobin: 16.2 g/dL (ref 13.2–17.1)
MCH: 30.8 pg (ref 27.0–33.0)
MCHC: 33.7 g/dL (ref 32.0–36.0)
MCV: 91.4 fL (ref 80.0–100.0)
MPV: 11.1 fL (ref 7.5–12.5)
Monocytes Relative: 7.5 %
Neutro Abs: 4815 {cells}/uL (ref 1500–7800)
Neutrophils Relative %: 53.5 %
Platelets: 174 10*3/uL (ref 140–400)
RBC: 5.26 10*6/uL (ref 4.20–5.80)
RDW: 12.7 % (ref 11.0–15.0)
Total Lymphocyte: 34.4 %
WBC: 9 10*3/uL (ref 3.8–10.8)

## 2023-08-23 LAB — LIPID PANEL
Cholesterol: 139 mg/dL (ref <200)
HDL: 52 mg/dL (ref >=40)
LDL Cholesterol (Calc): 59 mg/dL
Non-HDL Cholesterol (Calc): 87 mg/dL (ref <130)
Total CHOL/HDL Ratio: 2.7 (calc) (ref <5.0)
Triglycerides: 222 mg/dL — ABNORMAL HIGH (ref <150)

## 2023-08-23 LAB — COMPLETE METABOLIC PANEL WITH GFR
AG Ratio: 1.8 (calc) (ref 1.0–2.5)
ALT: 36 U/L (ref 9–46)
AST: 27 U/L (ref 10–35)
Albumin: 4.7 g/dL (ref 3.6–5.1)
Alkaline phosphatase (APISO): 74 U/L (ref 35–144)
BUN: 12 mg/dL (ref 7–25)
CO2: 29 mmol/L (ref 20–32)
Calcium: 9.6 mg/dL (ref 8.6–10.3)
Chloride: 102 mmol/L (ref 98–110)
Creat: 0.95 mg/dL (ref 0.70–1.28)
Globulin: 2.6 g/dL (ref 1.9–3.7)
Glucose, Bld: 162 mg/dL — ABNORMAL HIGH (ref 65–99)
Potassium: 4.8 mmol/L (ref 3.5–5.3)
Sodium: 140 mmol/L (ref 135–146)
Total Bilirubin: 0.8 mg/dL (ref 0.2–1.2)
Total Protein: 7.3 g/dL (ref 6.1–8.1)
eGFR: 84 mL/min/{1.73_m2} (ref >=60)

## 2023-08-23 LAB — MICROALBUMIN / CREATININE URINE RATIO
Creatinine, Urine: 146 mg/dL (ref 20–320)
Microalb Creat Ratio: 110 mg/g{creat} — ABNORMAL HIGH (ref <30)
Microalb, Ur: 16.1 mg/dL

## 2023-08-23 LAB — HEMOGLOBIN A1C
Hgb A1c MFr Bld: 7.9 %{Hb} — ABNORMAL HIGH (ref <5.7)
Mean Plasma Glucose: 180 mg/dL
eAG (mmol/L): 10 mmol/L

## 2023-08-23 LAB — PSA: PSA: 0.25 ng/mL (ref <=4.00)

## 2023-08-28 ENCOUNTER — Other Ambulatory Visit: Payer: Self-pay | Admitting: Family Medicine

## 2023-08-28 MED ORDER — LANCET DEVICE MISC: 1.0000 | Freq: Three times a day (TID) | 0 refills | Status: AC

## 2023-08-28 MED ORDER — BLOOD GLUCOSE MONITORING SUPPL DEVI
1.0000 | Freq: Three times a day (TID) | 0 refills | Status: AC
Start: 2023-08-28 — End: ?

## 2023-08-28 MED ORDER — LANCETS MISC. MISC: 1.0000 | Freq: Three times a day (TID) | 0 refills | Status: AC

## 2023-08-28 MED ORDER — BLOOD GLUCOSE TEST VI STRP: 1.0000 | ORAL_STRIP | Freq: Three times a day (TID) | 0 refills | Status: DC

## 2023-08-29 ENCOUNTER — Other Ambulatory Visit: Payer: Self-pay | Admitting: Family Medicine

## 2023-08-31 ENCOUNTER — Encounter

## 2023-08-31 NOTE — Telephone Encounter (Signed)
 Reordered 08/18/23 1 each  Requested Prescriptions  Refused Prescriptions Disp Refills   Blood Glucose Monitoring Suppl (ACCU-CHEK GUIDE ME) w/Device KIT [Pharmacy Med Name: Accu-Chek Guide Me Glucose Meter]  0    Sig: CHECK BLOOD SUGAR 3 TIMES DAILY IN THE MORNING, AT NOON, AND AT BEDTIME     Endocrinology: Diabetes - Testing Supplies Failed - 08/31/2023  7:40 AM      Failed - Valid encounter within last 12 months    Recent Outpatient Visits           1 year ago Foreign body of left ear, initial encounter   [facility] Pickard, Priscille Heidelberg, MD   2 years ago General medical exam   [facility] Donita Brooks, MD   3 years ago Essential hypertension   [facility] Tanya Nones, Priscille Heidelberg, MD   4 years ago Urticaria   [facility] Tanya Nones, Priscille Heidelberg, MD   4 years ago Essential hypertension   [facility] Pickard, Priscille Heidelberg, MD

## 2023-09-05 ENCOUNTER — Encounter: Payer: Self-pay | Admitting: Family Medicine

## 2023-09-22 ENCOUNTER — Ambulatory Visit: Admitting: Family Medicine

## 2023-09-22 ENCOUNTER — Encounter: Payer: Self-pay | Admitting: Family Medicine

## 2023-09-22 VITALS — BP 134/80 | HR 76 | Temp 97.8°F | Ht 68.0 in | Wt 213.1 lb

## 2023-09-22 DIAGNOSIS — E119 Type 2 diabetes mellitus without complications: Secondary | ICD-10-CM

## 2023-09-22 MED ORDER — METFORMIN HCL 500 MG PO TABS
1000.0000 mg | ORAL_TABLET | Freq: Two times a day (BID) | ORAL | 5 refills | Status: DC
Start: 2023-09-22 — End: 2024-01-22

## 2023-09-22 NOTE — Progress Notes (Signed)
 Subjective:    Patient ID: Daniel Blackwell, male    DOB: 1951/03/03, 73 y.o.   MRN: [id number] Recent labs showed hemoglobin A1c of 7.8.  Here today to discuss treatment options.   Past Medical History:  Diagnosis Date   Allergic reaction to alpha-gal 12/2019   can't eat beef   Allergy    mold/yeast, bee sting   Arthritis 2019   GOUT   Bipolar 1 disorder (HCC) 1996   pt states 25 years ago, pt states not a problem   Colon polyps    Hyperglycemia    Hyperlipidemia    Hypertension    Past Surgical History:  Procedure Laterality Date   APPENDECTOMY  2018   with intestine surgury   COLON SURGERY  08/02/2019   Ileocectomy after colonoscopy perforated his bowel.   COLONOSCOPY  07/2019   20 mm polyp , clip placed, bowel perforated at home, pt went to ER and had Ileocectomy   EYE SURGERY     laser eye surgery mar 2018, and april 2018, torn retina   LAPAROTOMY N/A 08/02/2019   Procedure: EXPLORATORY LAPAROTOMY, ILEOCECTOMY;  Surgeon: Axel Filler, MD;  Location: [facility] OR;  Service: General;  Laterality: N/A;   SMALL INTESTINE SURGERY     Current Outpatient Medications on File Prior to Visit  Medication Sig Dispense Refill   allopurinol (ZYLOPRIM) 300 MG tablet TAKE 1 TABLET BY MOUTH ONCE DAILY 30 tablet 0   amLODipine (NORVASC) 5 MG tablet TAKE 1 TABLET BY MOUTH ONCE DAILY 90 tablet 3   Blood Glucose Monitoring Suppl DEVI 1 each by Does not apply route in the morning, at noon, and at bedtime. May substitute to any manufacturer covered by patient's insurance. 1 each 0   cetirizine (ZYRTEC) 10 MG chewable tablet Chew 10 mg by mouth daily.     EPINEPHrine 0.3 mg/0.3 mL IJ SOAJ injection Inject 0.3 mLs (0.3 mg total) into the muscle as needed for anaphylaxis. 1 each 1   Glucose Blood (BLOOD GLUCOSE TEST STRIPS) STRP 1 each by In Vitro route in the morning, at noon, and at bedtime. May substitute to any manufacturer covered by patient's insurance. 100 strip 0   Lancet Device MISC 1 each  by Does not apply route in the morning, at noon, and at bedtime. May substitute to any manufacturer covered by patient's insurance. 1 each 0   Lancets Misc. MISC 1 each by Does not apply route in the morning, at noon, and at bedtime. May substitute to any manufacturer covered by patient's insurance. 100 each 0   lisinopril (ZESTRIL) 20 MG tablet TAKE 1 TABLET BY MOUTH ONCE DAILY 90 tablet 0   Multiple Vitamin (MULTIVITAMIN) tablet Take 1 tablet by mouth daily.     Omega-3 Fatty Acids (FISH OIL) 1000 MG CPDR Take 2 capsules by mouth See admin instructions. 2-3 times weekly     rosuvastatin (CRESTOR) 10 MG tablet TAKE 1 TABLET BY MOUTH EVERY DAY 90 tablet 0   vitamin B-12 (CYANOCOBALAMIN) 1000 MCG tablet Take 1,000 mcg by mouth daily.      No current facility-administered medications on file prior to visit.   Allergies  Allergen Reactions   Asa [Aspirin] Swelling   Other Swelling    CASHEWS   Penicillins Rash    Did it involve swelling of the face/tongue/throat, SOB, or low BP? No Did it involve sudden or severe rash/hives, skin peeling, or any reaction on the inside of your mouth or nose? No Did you  need to seek medical attention at a hospital or doctor's office? No When did it last happen?    50 years ago   If all above answers are "NO", may proceed with cephalosporin use.    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   At certain times of the year he works long hours   but then at certain times of the year works a few hours.   Social Drivers of Corporate investment banker Strain: Low Risk  (08/18/2023)   Overall Financial Resource Strain (CARDIA)    Difficulty of Paying Living Expenses: Not hard at all  Food Insecurity: No Food Insecurity (08/18/2023)   Hunger Vital Sign    Worried About Running Out of Food in the Last Year: Never true    Ran Out of Food in the Last Year: Never true  Transportation Needs: No Transportation Needs (08/18/2023)   PRAPARE - Administrator, Civil Service (Medical): No    Lack of Transportation (Non-Medical): No  Physical Activity: Inactive (08/18/2023)   Exercise Vital Sign    Days of Exercise per Week: 3 days    Minutes of Exercise per Session: 0 min  Stress: No Stress Concern Present (08/18/2023)   [employer] of Occupational Health - Occupational Stress Questionnaire    Feeling of Stress : Only a little  Social Connections: Unknown (08/18/2023)   Social Connection and Isolation Panel [NHANES]    Frequency of Communication with Friends and Family: Twice a week    Frequency of Social Gatherings with Friends and Family: Patient declined    Attends Religious Services: Patient declined    Database administrator or Organizations: Yes    Attends Banker Meetings: 1 to 4 times per year    Marital Status: Married  Catering manager Violence: Not At Risk (08/22/2023)   Humiliation, Afraid, Rape, and Kick questionnaire    Fear of Current or Ex-Partner: No    Emotionally Abused: No    Physically Abused: No    Sexually Abused: No     Review of Systems  All other systems reviewed and are  negative.      Objective:   Physical Exam Vitals reviewed.  Constitutional:      Appearance: Normal appearance.  Cardiovascular:     Rate and Rhythm: Normal rate and regular rhythm.  Pulmonary:     Effort: Pulmonary effort is normal.     Breath sounds: Normal breath  sounds.  Musculoskeletal:     Cervical back: Crepitus present. Muscular tenderness present. No pain with movement or spinous process tenderness. Normal range of motion.  Neurological:     Mental Status: He is alert.           Assessment & Plan:  Controlled type 2 diabetes mellitus without complication, without long-term current use of insulin (HCC) Originally recommended jardiance but patient hesitant to use due to bad experience patient's brother in law had on medication.  We discussed all other options including metformin, glipizide, actos, GLP-1 meds and patient decided to use metformin.  Gradually up titrate to metformin 1000 mg pobid and recheck labs in 3 months.

## 2023-10-09 ENCOUNTER — Other Ambulatory Visit: Payer: Self-pay | Admitting: Family Medicine

## 2023-10-25 ENCOUNTER — Other Ambulatory Visit: Payer: Self-pay | Admitting: Family Medicine

## 2023-10-25 DIAGNOSIS — I10 Essential (primary) hypertension: Secondary | ICD-10-CM

## 2023-10-26 NOTE — Telephone Encounter (Signed)
 Too soon for refill.  Requested Prescriptions  Pending Prescriptions Disp Refills   rosuvastatin  (CRESTOR ) 10 MG tablet [Pharmacy Med Name: rosuvastatin  10 mg tablet] 90 tablet 0    Sig: TAKE 1 TABLET BY MOUTH EVERY DAY     Cardiovascular:  Antilipid - Statins 2 Failed - 10/26/2023  3:45 PM      Failed - Valid encounter within last 12 months    Recent Outpatient Visits           1 month ago Controlled type 2 diabetes mellitus without complication, without long-term current use of insulin (HCC)   [place] [facility] Pickard, Cisco Crest, MD   2 months ago Colon cancer screening   [place] [facility] Austine Lefort, MD   1 year ago Cervical radiculopathy   [facility] Austine Lefort, MD              Failed - Lipid Panel in normal range within the last 12 months    Cholesterol  Date Value Ref Range Status  08/22/2023 139 <200 mg/dL Final   LDL Cholesterol (Calc)  Date Value Ref Range Status  08/22/2023 59 mg/dL (calc) Final    Comment:    Reference range: <100 . Desirable range <100 mg/dL for primary prevention;   <70 mg/dL for patients with CHD or diabetic patients  with > or = 2 CHD risk factors. Aaron Aas LDL-C is now calculated using the Martin-Hopkins  calculation, which is a validated novel method providing  better accuracy than the Friedewald equation in the  estimation of LDL-C.  Melinda Sprawls et al. Erroll Heard. 3875;643(32): 2061-2068  (http://education.[facility].com/faq/FAQ164)    HDL  Date Value Ref Range Status  08/22/2023 52 > OR = 40 mg/dL Final   Triglycerides  Date Value Ref Range Status  08/22/2023 222 (H) <150 mg/dL Final    Comment:    . If a non-fasting specimen was collected, consider repeat triglyceride testing on a fasting specimen if clinically indicated.  Imagene Mam et al. J. of Clin. Lipidol. 2015;9:129-169. Aaron Aas          Passed - Cr in normal range and within 360 days     Creat  Date Value Ref Range Status  08/22/2023 0.95 0.70 - 1.28 mg/dL Final   Creatinine, Urine  Date Value Ref Range Status  08/22/2023 146 20 - 320 mg/dL Final         Passed - Patient is not pregnant       lisinopril  (ZESTRIL ) 20 MG tablet [Pharmacy Med Name: lisinopril  20 mg tablet] 90 tablet 0    Sig: TAKE 1 TABLET BY MOUTH EVERY DAY     Cardiovascular:  ACE Inhibitors Failed - 10/26/2023  3:45 PM      Failed - Valid encounter within last 6 months    Recent Outpatient Visits           1 month ago Controlled type 2 diabetes mellitus without complication, without long-term current use of insulin (HCC)   [place] [facility] Pickard, Cisco Crest, MD   2 months ago Colon cancer screening   [place] [facility] Austine Lefort, MD   1 year ago Cervical radiculopathy   [facility] Pickard, Cisco Crest, MD              Passed - Cr in normal range and within 180 days    Creat  Date Value Ref Range Status  08/22/2023 0.95 0.70 - 1.28 mg/dL Final   Creatinine, Urine  Date Value Ref Range Status  08/22/2023 146 20 - 320 mg/dL Final         Passed - K in normal range and within 180 days    Potassium  Date Value Ref Range Status  08/22/2023 4.8 3.5 - 5.3 mmol/L Final         Passed - Patient is not pregnant      Passed - Last BP in normal range    BP Readings from Last 1 Encounters:  09/22/23 134/80

## 2023-12-04 DIAGNOSIS — E119 Type 2 diabetes mellitus without complications: Secondary | ICD-10-CM | POA: Diagnosis not present

## 2023-12-04 DIAGNOSIS — H35412 Lattice degeneration of retina, left eye: Secondary | ICD-10-CM | POA: Diagnosis not present

## 2023-12-04 DIAGNOSIS — H25013 Cortical age-related cataract, bilateral: Secondary | ICD-10-CM | POA: Diagnosis not present

## 2023-12-04 DIAGNOSIS — H16223 Keratoconjunctivitis sicca, not specified as Sjogren's, bilateral: Secondary | ICD-10-CM | POA: Diagnosis not present

## 2023-12-04 DIAGNOSIS — H25043 Posterior subcapsular polar age-related cataract, bilateral: Secondary | ICD-10-CM | POA: Diagnosis not present

## 2023-12-04 LAB — HM DIABETES EYE EXAM

## 2023-12-14 ENCOUNTER — Encounter: Payer: Self-pay | Admitting: Internal Medicine

## 2023-12-18 ENCOUNTER — Other Ambulatory Visit: Payer: Self-pay | Admitting: Family Medicine

## 2024-01-09 ENCOUNTER — Other Ambulatory Visit: Payer: Self-pay | Admitting: Family Medicine

## 2024-01-22 ENCOUNTER — Other Ambulatory Visit: Payer: Self-pay | Admitting: Family Medicine

## 2024-01-22 ENCOUNTER — Other Ambulatory Visit: Payer: Self-pay

## 2024-01-22 DIAGNOSIS — I10 Essential (primary) hypertension: Secondary | ICD-10-CM

## 2024-01-22 MED ORDER — ALLOPURINOL 300 MG PO TABS: 300.0000 mg | ORAL_TABLET | Freq: Every day | ORAL | 1 refills | Status: AC

## 2024-01-22 MED ORDER — LISINOPRIL 20 MG PO TABS
20.0000 mg | ORAL_TABLET | Freq: Every day | ORAL | 0 refills | Status: DC
Start: 2024-01-22 — End: 2024-04-26

## 2024-01-22 MED ORDER — METFORMIN HCL 500 MG PO TABS: 1000.0000 mg | ORAL_TABLET | Freq: Two times a day (BID) | ORAL | 5 refills | Status: AC

## 2024-02-09 ENCOUNTER — Ambulatory Visit (AMBULATORY_SURGERY_CENTER): Admitting: *Deleted

## 2024-02-09 VITALS — Ht 69.0 in | Wt 210.0 lb

## 2024-02-09 DIAGNOSIS — Z860100 Personal history of colon polyps, unspecified: Secondary | ICD-10-CM

## 2024-02-09 MED ORDER — NA SULFATE-K SULFATE-MG SULF 17.5-3.13-1.6 GM/177ML PO SOLN: 1.0000 | Freq: Once | ORAL | 0 refills | Status: AC

## 2024-02-09 NOTE — Progress Notes (Signed)
 Pre visit completed over telephone. Instructions sent through MyChart and secure email.    No egg or soy allergy known to patient  No issues known to pt with past sedation with any surgeries or procedures Patient denies ever being told they had issues or difficulty with intubation  No FH of Malignant Hyperthermia Pt is not on diet pills Pt is not on  home 02  Pt is not on blood thinners  Pt denies issues with constipation  No A fib or A flutter Have any cardiac testing pending-- NO Pt instructed to use Singlecare.com or GoodRx for a price reduction on prep     Chart reviewed by J. Nulty,CRNA

## 2024-02-13 ENCOUNTER — Encounter: Payer: Self-pay | Admitting: Internal Medicine

## 2024-02-19 ENCOUNTER — Encounter: Payer: Self-pay | Admitting: Internal Medicine

## 2024-02-19 ENCOUNTER — Ambulatory Visit: Admitting: Internal Medicine

## 2024-02-19 VITALS — BP 118/73 | HR 60 | Temp 98.1°F | Resp 12 | Ht 69.0 in | Wt 210.0 lb

## 2024-02-19 DIAGNOSIS — E785 Hyperlipidemia, unspecified: Secondary | ICD-10-CM | POA: Diagnosis not present

## 2024-02-19 DIAGNOSIS — F319 Bipolar disorder, unspecified: Secondary | ICD-10-CM | POA: Diagnosis not present

## 2024-02-19 DIAGNOSIS — Z860101 Personal history of adenomatous and serrated colon polyps: Secondary | ICD-10-CM

## 2024-02-19 DIAGNOSIS — Z1211 Encounter for screening for malignant neoplasm of colon: Secondary | ICD-10-CM

## 2024-02-19 DIAGNOSIS — K648 Other hemorrhoids: Secondary | ICD-10-CM | POA: Diagnosis not present

## 2024-02-19 DIAGNOSIS — K5730 Diverticulosis of large intestine without perforation or abscess without bleeding: Secondary | ICD-10-CM | POA: Diagnosis not present

## 2024-02-19 DIAGNOSIS — I10 Essential (primary) hypertension: Secondary | ICD-10-CM | POA: Diagnosis not present

## 2024-02-19 DIAGNOSIS — D175 Benign lipomatous neoplasm of intra-abdominal organs: Secondary | ICD-10-CM | POA: Diagnosis not present

## 2024-02-19 DIAGNOSIS — E119 Type 2 diabetes mellitus without complications: Secondary | ICD-10-CM | POA: Diagnosis not present

## 2024-02-19 DIAGNOSIS — Z860100 Personal history of colon polyps, unspecified: Secondary | ICD-10-CM

## 2024-02-19 MED ORDER — SODIUM CHLORIDE 0.9 % IV SOLN: 500.0000 mL | INTRAVENOUS | Status: DC

## 2024-02-19 NOTE — Op Note (Signed)
 [facility] Patient Name: Daniel Blackwell Procedure Date: 02/19/2024 10:39 AM MRN: [id number] Endoscopist: Gordy CHRISTELLA Starch , MD, [id number] Age: 73 Referring MD:  Date of Birth: 1951-03-31 Gender: Male Account #: [id number] Procedure:                Colonoscopy Indications:              High risk colon cancer surveillance: Personal                            history of multiple adenomas, Last colonoscopy:                            August 2022 (TA x 4, SSP x 1), hx of cecal TVA 2021 Medicines:                Monitored Anesthesia Care Procedure:                Pre-Anesthesia Assessment:                           - Prior to the procedure, a History and Physical                            was performed, and patient medications and                            allergies were reviewed. The patient's tolerance of                            previous anesthesia was also reviewed. The risks                            and benefits of the procedure and the sedation                            options and risks were discussed with the patient.                            All questions were answered, and informed consent                            was obtained. Prior Anticoagulants: The patient has                            taken no anticoagulant or antiplatelet agents. ASA                            Grade Assessment: II - A patient with mild systemic                            disease. After reviewing the risks and benefits,                            the patient was deemed in satisfactory condition to  undergo the procedure.                           After obtaining informed consent, the colonoscope                            was passed under direct vision. Throughout the                            procedure, the patient's blood pressure, pulse, and                            oxygen saturations were monitored continuously. The                            Olympus  CF-HQ190L ([id number]) Colonoscope was                            introduced through the anus and advanced to the                            terminal ileum. The colonoscopy was performed                            without difficulty. The patient tolerated the                            procedure well. The quality of the bowel                            preparation was good. The ileocecal valve,                            appendiceal orifice, and rectum were photographed. Scope In: 10:43:04 AM Scope Out: 10:55:42 AM Scope Withdrawal Time: 0 hours 9 minutes 52 seconds  Total Procedure Duration: 0 hours 12 minutes 38 seconds  Findings:                 The digital rectal exam was normal.                           The terminal ileum appeared normal.                           There was a small lipoma, at the hepatic flexure.                           A few small-mouthed diverticula were found in the                            sigmoid colon.                           Internal hemorrhoids were found during  retroflexion. The hemorrhoids were small. Complications:            No immediate complications. Estimated Blood Loss:     Estimated blood loss: none. Impression:               - The examined portion of the ileum was normal.                           - Small lipoma at the hepatic flexure.                           - Mild diverticulosis in the sigmoid colon.                           - Small internal hemorrhoids.                           - No specimens collected. Recommendation:           - Patient has a contact number available for                            emergencies. The signs and symptoms of potential                            delayed complications were discussed with the                            patient. Return to normal activities tomorrow.                            Written discharge instructions were provided to the                            patient.                            - Resume previous diet.                           - Continue present medications.                           - Repeat colonoscopy in 5 years for surveillance.                            Office visit before next colonoscopy to ensure                            medically appropriate given age in 5 years. Gordy CHRISTELLA Starch, MD 02/19/2024 11:03:55 AM This report has been signed electronically.

## 2024-02-19 NOTE — Progress Notes (Signed)
 Report to PACU, RN, vss, BBS= Clear.

## 2024-02-19 NOTE — Progress Notes (Signed)
 Pt's states no medical or surgical changes since previsit or office visit.

## 2024-02-19 NOTE — Patient Instructions (Signed)
 YOU HAD AN ENDOSCOPIC PROCEDURE TODAY AT THE [facility]:   Refer to the procedure report that was given to you for any specific questions about what was found during the examination.  If the procedure report does not answer your questions, please call your gastroenterologist to clarify.  If you requested that your care partner not be given the details of your procedure findings, then the procedure report has been included in a sealed envelope for you to review at your convenience later.  YOU SHOULD EXPECT: Some feelings of bloating in the abdomen. Passage of more gas than usual.  Walking can help get rid of the air that was put into your GI tract during the procedure and reduce the bloating. If you had a lower endoscopy (such as a colonoscopy or flexible sigmoidoscopy) you may notice spotting of blood in your stool or on the toilet paper. If you underwent a bowel prep for your procedure, you may not have a normal bowel movement for a few days.  Please Note:  You might notice some irritation and congestion in your nose or some drainage.  This is from the oxygen used during your procedure.  There is no need for concern and it should clear up in a day or so.  SYMPTOMS TO REPORT IMMEDIATELY:  Following lower endoscopy (colonoscopy or flexible sigmoidoscopy):  Excessive amounts of blood in the stool  Significant tenderness or worsening of abdominal pains  Swelling of the abdomen that is new, acute  Fever of 100F or higher  Resume previous diet Continue present medications Repeat colonoscopy in 5 years Handouts on diverticulosis and hemorrhoids given   For urgent or emergent issues, a gastroenterologist can be reached at any hour by calling [phone]. Do not use MyChart messaging for urgent concerns.    DIET:  We do recommend a small meal at first, but then you may proceed to your regular diet.  Drink plenty of fluids but you should avoid alcoholic beverages for 24  hours.  ACTIVITY:  You should plan to take it easy for the rest of today and you should NOT DRIVE or use heavy machinery until tomorrow (because of the sedation medicines used during the test).    FOLLOW UP: Our staff will call the number listed on your records the next business day following your procedure.  We will call around 7:15- 8:00 am to check on you and address any questions or concerns that you may have regarding the information given to you following your procedure. If we do not reach you, we will leave a message.     If any biopsies were taken you will be contacted by phone or by letter within the next 1-3 weeks.  Please call us  at [phone] if you have not heard about the biopsies in 3 weeks.    SIGNATURES/CONFIDENTIALITY: You and/or your care partner have signed paperwork which will be entered into your electronic medical record.  These signatures attest to the fact that that the information above on your After Visit Summary has been reviewed and is understood.  Full responsibility of the confidentiality of this discharge information lies with you and/or your care-partner.

## 2024-02-19 NOTE — Progress Notes (Signed)
 GASTROENTEROLOGY PROCEDURE H&P NOTE   Primary Care Physician: Duanne Butler DASEN, MD    Reason for Procedure:  Personal history of multiple colonic polyps including those with villous component  Plan:    Colonoscopy  Patient is appropriate for endoscopic procedure(s) in the ambulatory ([facility]) setting.  The nature of the procedure, as well as the risks, benefits, and alternatives were carefully and thoroughly reviewed with the patient. Ample time for discussion and questions allowed. The patient understood, was satisfied, and agreed to proceed.     HPI: Daniel Blackwell is a 73 y.o. male who presents for surveillance colonoscopy.  Medical history as below.  Tolerated the prep.  No recent chest pain or shortness of breath.  No abdominal pain today.  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   COLONOSCOPY  07/2019   20 mm polyp , clip placed, bowel perforated at home, pt went to ER and had Ileocectomy   EYE SURGERY     laser eye surgery mar 2018, and april 2018, torn retina   LAPAROTOMY N/A 08/02/2019   Procedure: EXPLORATORY LAPAROTOMY, ILEOCECTOMY;  Surgeon: Rubin Calamity, MD;  Location: [facility] OR;  Service: General;  Laterality: N/A;   SMALL INTESTINE SURGERY      Prior to Admission medications   Medication Sig Start Date End Date Taking? Authorizing Provider  amLODipine  (NORVASC ) 5 MG tablet TAKE 1 TABLET BY MOUTH ONCE DAILY 08/22/23  Yes Duanne Butler DASEN, MD  cetirizine (ZYRTEC)  10 MG chewable tablet Chew 10 mg by mouth daily.   Yes [provider]  lisinopril  (ZESTRIL ) 20 MG tablet Take 1 tablet (20 mg total) by mouth daily. 01/22/24  Yes Duanne Butler DASEN, MD  rosuvastatin  (CRESTOR ) 10 MG tablet TAKE 1 TABLET BY MOUTH EVERY DAY 01/09/24  Yes Duanne Butler DASEN, MD  ACCU-CHEK GUIDE TEST test strip CHECK BLOOD SUGAR 3 TIMES DAILY IN THE MORNING, AT NOON, AT BEDTIME 12/18/23   Duanne Butler DASEN, MD  allopurinol  (ZYLOPRIM ) 300 MG tablet Take 1 tablet (300 mg total) by mouth daily. 01/22/24   Duanne Butler DASEN, MD  Blood Glucose Monitoring Suppl DEVI 1 each by Does not apply route in the morning, at noon, and at bedtime. May substitute to any manufacturer covered by patient's insurance. 08/28/23   Duanne Butler DASEN, MD  EPINEPHrine  0.3 mg/0.3 mL IJ SOAJ injection Inject 0.3 mLs (0.3 mg total) into the muscle as needed for anaphylaxis. 07/19/19   Duanne Butler DASEN, MD  metFORMIN  (GLUCOPHAGE ) 500 MG tablet Take 2 tablets (1,000 mg total) by mouth 2 (two) times daily with a meal. 01/22/24   Duanne Butler DASEN, MD  Multiple Vitamin (MULTIVITAMIN) tablet Take 1 tablet by mouth daily.    [provider]  Omega-3 Fatty Acids (FISH OIL) 1000 MG CPDR Take 2 capsules by mouth See admin instructions. 2-3 times weekly    [provider]  vitamin B-12 (CYANOCOBALAMIN) 1000 MCG tablet Take 1,000 mcg by mouth daily.     [provider]    Current Outpatient Medications  Medication Sig Dispense Refill   amLODipine  (NORVASC ) 5 MG tablet TAKE 1 TABLET BY MOUTH ONCE DAILY 90 tablet 3   cetirizine (ZYRTEC) 10 MG chewable tablet Chew 10 mg by mouth daily.     lisinopril  (ZESTRIL ) 20 MG tablet Take 1 tablet (20 mg total) by mouth daily. 90 tablet 0   rosuvastatin  (CRESTOR ) 10 MG tablet TAKE 1 TABLET BY MOUTH EVERY DAY 90 tablet 0   ACCU-CHEK GUIDE TEST test strip CHECK BLOOD SUGAR 3 TIMES DAILY IN THE MORNING, AT NOON, AT BEDTIME 100 strip 0   allopurinol  (ZYLOPRIM ) 300  MG tablet Take 1 tablet (300 mg total) by mouth daily. 90 tablet 1   Blood Glucose Monitoring Suppl DEVI 1 each by Does not apply route in the morning, at noon, and at bedtime. May substitute to any manufacturer covered by patient's insurance. 1 each 0   EPINEPHrine  0.3 mg/0.3 mL IJ SOAJ injection Inject 0.3 mLs (0.3 mg total) into the muscle as needed for anaphylaxis. 1 each 1   metFORMIN  (GLUCOPHAGE ) 500 MG tablet Take 2 tablets (1,000 mg total) by mouth 2 (two) times daily with a meal. 120 tablet 5   Multiple Vitamin (MULTIVITAMIN) tablet Take 1 tablet by mouth daily.     Omega-3 Fatty Acids (FISH OIL) 1000 MG CPDR Take 2 capsules by mouth See admin instructions. 2-3 times weekly     vitamin B-12 (CYANOCOBALAMIN) 1000 MCG tablet Take 1,000 mcg by mouth daily.      Current Facility-Administered Medications  Medication Dose Route Frequency Provider Last Rate Last Admin   0.9 %  sodium chloride  infusion  500 mL Intravenous Continuous Toa Mia, Gordy HERO, MD        Allergies as of 02/19/2024 - Review Complete 02/19/2024  Allergen Reaction Noted   Asa [aspirin] Swelling 04/03/2013   Other Swelling 04/03/2013   Penicillins Rash 12/01/2010   Sulfa antibiotics Rash 12/01/2010    Family History  Problem Relation Age of Onset   Diabetes Father    Heart disease Father    Colon cancer Neg Hx    Colon polyps Neg Hx    Esophageal cancer Neg Hx    Rectal cancer Neg Hx    Stomach cancer Neg Hx     Social History   Socioeconomic History   Marital status: Married    Spouse name: Not on file   Number of children: Not on file   Years of education: Not on file   Highest education level: Doctorate  Occupational History   Occupation: State, 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   At certain times of the year he works long hours   but then at certain times of the year  works a few hours.   Social Drivers of Corporate investment banker Strain: Low Risk  (08/18/2023)   Overall Financial Resource Strain (CARDIA)    Difficulty of Paying Living Expenses: Not hard at all  Food Insecurity: No Food Insecurity (08/18/2023)   Hunger Vital Sign    Worried About Running Out of Food in the Last Year: Never true    Ran Out of Food in the Last Year: Never true  Transportation Needs: No Transportation Needs (08/18/2023)   PRAPARE - Administrator, Civil Service (Medical): No    Lack of Transportation (Non-Medical): No  Physical Activity: Inactive (08/18/2023)   Exercise Vital Sign    Days of Exercise per Week: 3 days    Minutes of Exercise per Session: 0 min  Stress: No Stress Concern Present (08/18/2023)   [employer] of Occupational Health - Occupational Stress Questionnaire    Feeling of Stress : Only a little  Social Connections: Unknown (08/18/2023)   Social Connection and Isolation Panel    Frequency of Communication with Friends and Family: Twice a week    Frequency of Social Gatherings with Friends and Family: Patient declined    Attends Religious Services: Patient declined    Database administrator or Organizations: Yes    Attends Banker Meetings: 1 to 4 times per year    Marital Status: Married  Catering manager Violence: Not At Risk (08/22/2023)   Humiliation, Afraid, Rape, and Kick questionnaire    Fear of Current or Ex-Partner: No    Emotionally Abused: No    Physically Abused: No    Sexually Abused: No    Physical Exam: Vital signs in last 24 hours: @BP  134/77    Pulse 71   Temp 98.1 F (36.7 C) (Temporal)   Ht 5' 9 (1.753 m)   Wt 210 lb (95.3 kg)   SpO2 98%   BMI 31.01 kg/m  GEN: NAD EYE: Sclerae anicteric ENT: MMM CV: Non-tachycardic Pulm: CTA b/l GI: Soft, NT/ND NEURO:  Alert & Oriented x 3   Gordy Starch, MD [facility]  02/19/2024 10:35 AM

## 2024-02-20 ENCOUNTER — Telehealth: Payer: Self-pay | Admitting: Lactation Services

## 2024-02-20 NOTE — Telephone Encounter (Signed)
  Follow up Call-     02/19/2024   10:18 AM  Call back number  Post procedure Call Back phone  # [phone]  Permission to leave phone message Yes     Patient questions:  Do you have a fever, pain , or abdominal swelling? No. Pain Score  0 *  Have you tolerated food without any problems? Yes.    Have you been able to return to your normal activities? Yes.    Do you have any questions about your discharge instructions: Diet   No. Medications  No. Follow up visit  No.  Do you have questions or concerns about your Care? No.  Actions: * If pain score is 4 or above: No action needed, pain <4.

## 2024-04-09 ENCOUNTER — Other Ambulatory Visit: Payer: Self-pay | Admitting: Family Medicine

## 2024-04-11 NOTE — Telephone Encounter (Signed)
 Requested Prescriptions  Pending Prescriptions Disp Refills   rosuvastatin  (CRESTOR ) 10 MG tablet [Pharmacy Med Name: rosuvastatin  10 mg tablet] 90 tablet 1    Sig: TAKE 1 TABLET BY MOUTH EVERY DAY     Cardiovascular:  Antilipid - Statins 2 Failed - 04/11/2024 11:34 AM      Failed - Lipid Panel in normal range within the last 12 months    Cholesterol  Date Value Ref Range Status  08/22/2023 139 <200 mg/dL Final   LDL Cholesterol (Calc)  Date Value Ref Range Status  08/22/2023 59 mg/dL (calc) Final    Comment:    Reference range: <100 . Desirable range <100 mg/dL for primary prevention;   <70 mg/dL for patients with CHD or diabetic patients  with > or = 2 CHD risk factors. SABRA LDL-C is now calculated using the Martin-Hopkins  calculation, which is a validated novel method providing  better accuracy than the Friedewald equation in the  estimation of LDL-C.  Gladis APPLETHWAITE et al. SANDREA. 7986;689(80): 2061-2068  (http://education.[facility].com/faq/FAQ164)    HDL  Date Value Ref Range Status  08/22/2023 52 > OR = 40 mg/dL Final   Triglycerides  Date Value Ref Range Status  08/22/2023 222 (H) <150 mg/dL Final    Comment:    . If a non-fasting specimen was collected, consider repeat triglyceride testing on a fasting specimen if clinically indicated.  Veatrice et al. J. of Clin. Lipidol. 2015;9:129-169. SABRA          Passed - Cr in normal range and within 360 days    Creat  Date Value Ref Range Status  08/22/2023 0.95 0.70 - 1.28 mg/dL Final   Creatinine, Urine  Date Value Ref Range Status  08/22/2023 146 20 - 320 mg/dL Final         Passed - Patient is not pregnant      Passed - Valid encounter within last 12 months    Recent Outpatient Visits           6 months ago Controlled type 2 diabetes mellitus without complication, without long-term current use of insulin (HCC)   [place] [facility] Pickard, Butler DASEN, MD   7 months ago Colon cancer  screening   [place] [facility] Duanne Butler DASEN, MD   2 years ago Cervical radiculopathy   [place] [facility] Pickard, Butler DASEN, MD

## 2024-04-22 ENCOUNTER — Other Ambulatory Visit: Payer: Self-pay | Admitting: Family Medicine

## 2024-04-22 DIAGNOSIS — I10 Essential (primary) hypertension: Secondary | ICD-10-CM

## 2024-04-23 NOTE — Telephone Encounter (Signed)
 Requested medications are due for refill today.  yes  Requested medications are on the active medications list.  yes  Last refill. 01/22/2024 #90 0 rf  Future visit scheduled.   no  Notes to clinic.  Labs are expired.    Requested Prescriptions  Pending Prescriptions Disp Refills   lisinopril  (ZESTRIL ) 20 MG tablet [Pharmacy Med Name: lisinopril  20 mg tablet] 90 tablet 0    Sig: TAKE 1 TABLET BY MOUTH ONCE DAILY     Cardiovascular:  ACE Inhibitors Failed - 04/23/2024  5:30 PM      Failed - Cr in normal range and within 180 days    Creat  Date Value Ref Range Status  08/22/2023 0.95 0.70 - 1.28 mg/dL Final   Creatinine, Urine  Date Value Ref Range Status  08/22/2023 146 20 - 320 mg/dL Final         Failed - K in normal range and within 180 days    Potassium  Date Value Ref Range Status  08/22/2023 4.8 3.5 - 5.3 mmol/L Final         Failed - Valid encounter within last 6 months    Recent Outpatient Visits           7 months ago Controlled type 2 diabetes mellitus without complication, without long-term current use of insulin [facility])   [place] [facility] Duanne Butler DASEN, MD   8 months ago Colon cancer screening   [place] [facility] Duanne Butler DASEN, MD   2 years ago Cervical radiculopathy   [place] [facility] Pickard, Butler DASEN, MD              Passed - Patient is not pregnant      Passed - Last BP in normal range    BP Readings from Last 1 Encounters:  02/19/24 118/73
